# Patient Record
Sex: Female | Born: 1985 | Race: Black or African American | Hispanic: No | Marital: Single | Smoking: Current every day smoker
Health system: Southern US, Community
[De-identification: ages and names within clinical notes are randomized; demographics above are authoritative.]

## PROBLEM LIST (undated history)

## (undated) ENCOUNTER — Inpatient Hospital Stay (HOSPITAL_COMMUNITY): Payer: Self-pay

## (undated) DIAGNOSIS — F410 Panic disorder [episodic paroxysmal anxiety] without agoraphobia: Secondary | ICD-10-CM

## (undated) DIAGNOSIS — R109 Unspecified abdominal pain: Secondary | ICD-10-CM

## (undated) DIAGNOSIS — O26899 Other specified pregnancy related conditions, unspecified trimester: Secondary | ICD-10-CM

## (undated) HISTORY — PX: OTHER SURGICAL HISTORY: SHX169

---
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
## 2007-06-06 ENCOUNTER — Emergency Department (HOSPITAL_COMMUNITY): Admission: EM | Admit: 2007-06-06 | Discharge: 2007-06-06 | Payer: Self-pay | Admitting: Emergency Medicine

## 2008-09-26 ENCOUNTER — Emergency Department (HOSPITAL_COMMUNITY): Admission: EM | Admit: 2008-09-26 | Discharge: 2008-09-27 | Payer: Self-pay | Admitting: Emergency Medicine

## 2008-12-05 ENCOUNTER — Emergency Department (HOSPITAL_COMMUNITY): Admission: EM | Admit: 2008-12-05 | Discharge: 2008-12-05 | Payer: Self-pay | Admitting: Emergency Medicine

## 2009-02-04 ENCOUNTER — Emergency Department (HOSPITAL_COMMUNITY): Admission: EM | Admit: 2009-02-04 | Discharge: 2009-02-04 | Payer: Self-pay | Admitting: Emergency Medicine

## 2010-10-02 LAB — URINALYSIS, ROUTINE W REFLEX MICROSCOPIC
Bilirubin Urine: NEGATIVE
Glucose, UA: NEGATIVE mg/dL
Hgb urine dipstick: NEGATIVE
Ketones, ur: NEGATIVE mg/dL
Nitrite: NEGATIVE
Protein, ur: NEGATIVE mg/dL
Specific Gravity, Urine: 1.025 (ref 1.005–1.030)
Urobilinogen, UA: 0.2 mg/dL (ref 0.0–1.0)
pH: 5.5 (ref 5.0–8.0)

## 2010-10-02 LAB — DIFFERENTIAL
Eosinophils Absolute: 0.1 10*3/uL (ref 0.0–0.7)
Eosinophils Relative: 2 % (ref 0–5)
Lymphocytes Relative: 36 % (ref 12–46)
Lymphs Abs: 2.3 10*3/uL (ref 0.7–4.0)
Monocytes Absolute: 0.3 10*3/uL (ref 0.1–1.0)
Monocytes Relative: 4 % (ref 3–12)

## 2010-10-02 LAB — BASIC METABOLIC PANEL
BUN: 13 mg/dL (ref 6–23)
CO2: 27 mEq/L (ref 19–32)
Calcium: 9.3 mg/dL (ref 8.4–10.5)
Chloride: 103 mEq/L (ref 96–112)
Creatinine, Ser: 0.78 mg/dL (ref 0.4–1.2)
GFR calc Af Amer: >60 mL/min (ref >60)
GFR calc non Af Amer: >60 mL/min (ref >60)
Glucose, Bld: 122 mg/dL — ABNORMAL HIGH (ref 70–99)
Potassium: 3.7 mEq/L (ref 3.5–5.1)
Sodium: 136 mEq/L (ref 135–145)

## 2010-10-02 LAB — POCT PREGNANCY, URINE: Preg Test, Ur: NEGATIVE

## 2010-10-02 LAB — CBC
HCT: 43.1 % (ref 36.0–46.0)
Hemoglobin: 14.8 g/dL (ref 12.0–15.0)
MCHC: 34.3 g/dL (ref 30.0–36.0)
MCV: 98.9 fL (ref 78.0–100.0)
Platelets: 292 10*3/uL (ref 150–400)
RBC: 4.35 MIL/uL (ref 3.87–5.11)
RDW: 12.5 % (ref 11.5–15.5)
WBC: 6.6 10*3/uL (ref 4.0–10.5)

## 2010-10-04 LAB — URINE CULTURE

## 2010-10-04 LAB — WET PREP, GENITAL
Trich, Wet Prep: NONE SEEN
WBC, Wet Prep HPF POC: NONE SEEN
Yeast Wet Prep HPF POC: NONE SEEN

## 2010-10-04 LAB — GC/CHLAMYDIA PROBE AMP, GENITAL: Chlamydia, DNA Probe: NEGATIVE

## 2010-10-04 LAB — URINALYSIS, ROUTINE W REFLEX MICROSCOPIC
Glucose, UA: NEGATIVE mg/dL
Specific Gravity, Urine: 1.033 — ABNORMAL HIGH (ref 1.005–1.030)
Urobilinogen, UA: 0.2 mg/dL (ref 0.0–1.0)
pH: 5.5 (ref 5.0–8.0)

## 2010-10-04 LAB — URINE MICROSCOPIC-ADD ON

## 2010-10-27 ENCOUNTER — Emergency Department (HOSPITAL_COMMUNITY)
Admission: EM | Admit: 2010-10-27 | Discharge: 2010-10-27 | Discharge status: Against Medical Advice | Payer: Self-pay | Attending: Emergency Medicine | Admitting: Emergency Medicine

## 2010-10-27 DIAGNOSIS — N898 Other specified noninflammatory disorders of vagina: Secondary | ICD-10-CM | POA: Insufficient documentation

## 2011-04-04 LAB — GC/CHLAMYDIA PROBE AMP, GENITAL
Chlamydia, DNA Probe: NEGATIVE
GC Probe Amp, Genital: NEGATIVE

## 2011-04-04 LAB — URINALYSIS, ROUTINE W REFLEX MICROSCOPIC
Bilirubin Urine: NEGATIVE
Hgb urine dipstick: NEGATIVE
Specific Gravity, Urine: 1.019
pH: 7

## 2011-04-04 LAB — URINE MICROSCOPIC-ADD ON

## 2011-05-26 ENCOUNTER — Emergency Department (HOSPITAL_COMMUNITY)
Admission: EM | Admit: 2011-05-26 | Discharge: 2011-05-26 | Discharge status: Against Medical Advice | Payer: Self-pay | Attending: Emergency Medicine | Admitting: Emergency Medicine

## 2011-05-26 ENCOUNTER — Encounter: Payer: Self-pay | Admitting: *Deleted

## 2011-05-26 DIAGNOSIS — IMO0002 Reserved for concepts with insufficient information to code with codable children: Secondary | ICD-10-CM | POA: Insufficient documentation

## 2011-05-26 DIAGNOSIS — R109 Unspecified abdominal pain: Secondary | ICD-10-CM | POA: Insufficient documentation

## 2011-05-26 DIAGNOSIS — F411 Generalized anxiety disorder: Secondary | ICD-10-CM | POA: Insufficient documentation
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
## 2011-05-26 NOTE — ED Notes (Signed)
Pt gave false information (name and birthdate) on arrival. After several questions, the patient gave the correct information.
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
## 2011-06-03 ENCOUNTER — Emergency Department (HOSPITAL_COMMUNITY)
Admission: EM | Admit: 2011-06-03 | Discharge: 2011-06-03 | Discharge status: Against Medical Advice | Payer: Self-pay | Attending: Emergency Medicine | Admitting: Emergency Medicine

## 2011-06-03 ENCOUNTER — Encounter (HOSPITAL_COMMUNITY): Payer: Self-pay | Admitting: *Deleted

## 2011-06-03 DIAGNOSIS — M549 Dorsalgia, unspecified: Secondary | ICD-10-CM | POA: Insufficient documentation

## 2011-06-03 DIAGNOSIS — R1110 Vomiting, unspecified: Secondary | ICD-10-CM | POA: Insufficient documentation

## 2011-06-03 DIAGNOSIS — M542 Cervicalgia: Secondary | ICD-10-CM | POA: Insufficient documentation

## 2011-06-03 NOTE — ED Notes (Signed)
Pt called, no response
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
## 2011-06-03 NOTE — ED Notes (Signed)
Patient reports nausea and vomitting.  She also reports neck pain and back pain for the past 3 days. Patient denies pain when voiding.  Patient states she has some abd pain.

## 2011-08-10 ENCOUNTER — Encounter (HOSPITAL_COMMUNITY): Payer: Self-pay

## 2011-08-10 ENCOUNTER — Emergency Department (HOSPITAL_COMMUNITY)
Admission: EM | Admit: 2011-08-10 | Discharge: 2011-08-10 | Disposition: A | Discharge status: Home / Self Care | Payer: Self-pay | Attending: Emergency Medicine | Admitting: Emergency Medicine

## 2011-08-10 ENCOUNTER — Emergency Department (HOSPITAL_COMMUNITY): Payer: Self-pay

## 2011-08-10 DIAGNOSIS — M549 Dorsalgia, unspecified: Secondary | ICD-10-CM | POA: Insufficient documentation

## 2011-08-10 DIAGNOSIS — R109 Unspecified abdominal pain: Secondary | ICD-10-CM | POA: Insufficient documentation

## 2011-08-10 DIAGNOSIS — R0789 Other chest pain: Secondary | ICD-10-CM

## 2011-08-10 DIAGNOSIS — F17200 Nicotine dependence, unspecified, uncomplicated: Secondary | ICD-10-CM | POA: Insufficient documentation

## 2011-08-10 DIAGNOSIS — J45909 Unspecified asthma, uncomplicated: Secondary | ICD-10-CM | POA: Insufficient documentation

## 2011-08-10 DIAGNOSIS — R071 Chest pain on breathing: Secondary | ICD-10-CM | POA: Insufficient documentation

## 2011-08-10 MED ORDER — NAPROXEN 500 MG PO TABS: 500.0000 mg | ORAL_TABLET | Freq: Two times a day (BID) | ORAL | Status: DC

## 2011-08-10 MED ORDER — KETOROLAC TROMETHAMINE 30 MG/ML IJ SOLN
60.0000 mg | Freq: Once | INTRAMUSCULAR | Status: AC
  Administered 2011-08-10: 60 mg via INTRAMUSCULAR
  Filled 2011-08-10 (×2): qty 1

## 2011-08-10 NOTE — ED Notes (Signed)
PATIENT HAS RETURNED FROM XRAY.
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
## 2011-08-10 NOTE — ED Provider Notes (Signed)
Medical screening examination/treatment/procedure(s) were performed by non-physician practitioner and as supervising physician I was immediately available for consultation/collaboration.   Shelda Jakes, MD 08/10/11 [phone]

## 2011-10-18 ENCOUNTER — Encounter (HOSPITAL_COMMUNITY): Payer: Self-pay | Admitting: Emergency Medicine

## 2011-10-18 ENCOUNTER — Emergency Department (HOSPITAL_COMMUNITY)
Admission: EM | Admit: 2011-10-18 | Discharge: 2011-10-19 | Disposition: A | Discharge status: Home / Self Care | Payer: Self-pay | Attending: Emergency Medicine | Admitting: Emergency Medicine

## 2011-10-18 DIAGNOSIS — O99340 Other mental disorders complicating pregnancy, unspecified trimester: Secondary | ICD-10-CM | POA: Insufficient documentation

## 2011-10-18 DIAGNOSIS — F1010 Alcohol abuse, uncomplicated: Secondary | ICD-10-CM | POA: Insufficient documentation
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
## 2011-10-18 NOTE — ED Notes (Signed)
Pt declining to say how far along she is in her pregnancy-Rapid response OB paged and spoke with Billy Fischer on call and will look pt up to see hx-will call back with info

## 2011-10-18 NOTE — ED Notes (Signed)
Pt in hallway stating she wants to leave-denies any treatment-pt is ambulatory with no assist and speaking full clear sentences-Dr. Lars Mage and this writer talked with pt about further treatment-having rapid response OB to check her-pt again declined any further treatment-[employer] to escort pt out
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
## 2011-11-05 ENCOUNTER — Encounter (HOSPITAL_COMMUNITY): Payer: Self-pay | Admitting: *Deleted

## 2011-11-05 ENCOUNTER — Emergency Department (HOSPITAL_COMMUNITY)
Admission: EM | Admit: 2011-11-05 | Discharge: 2011-11-05 | Disposition: A | Discharge status: Home / Self Care | Payer: Self-pay | Attending: Emergency Medicine | Admitting: Emergency Medicine

## 2011-11-05 DIAGNOSIS — R21 Rash and other nonspecific skin eruption: Secondary | ICD-10-CM | POA: Insufficient documentation

## 2011-11-05 DIAGNOSIS — R112 Nausea with vomiting, unspecified: Secondary | ICD-10-CM | POA: Insufficient documentation

## 2011-11-05 DIAGNOSIS — N390 Urinary tract infection, site not specified: Secondary | ICD-10-CM | POA: Insufficient documentation

## 2011-11-05 DIAGNOSIS — T781XXA Other adverse food reactions, not elsewhere classified, initial encounter: Secondary | ICD-10-CM | POA: Insufficient documentation

## 2011-11-05 DIAGNOSIS — R229 Localized swelling, mass and lump, unspecified: Secondary | ICD-10-CM | POA: Insufficient documentation

## 2011-11-05 DIAGNOSIS — X58XXXA Exposure to other specified factors, initial encounter: Secondary | ICD-10-CM | POA: Insufficient documentation

## 2011-11-05 DIAGNOSIS — T7840XA Allergy, unspecified, initial encounter: Secondary | ICD-10-CM

## 2011-11-05 DIAGNOSIS — J45909 Unspecified asthma, uncomplicated: Secondary | ICD-10-CM | POA: Insufficient documentation

## 2011-11-05 LAB — CBC
HCT: 40.3 % (ref 36.0–46.0)
Hemoglobin: 14.1 g/dL (ref 12.0–15.0)
MCH: 34.6 pg — ABNORMAL HIGH (ref 26.0–34.0)
MCHC: 35 g/dL (ref 30.0–36.0)
MCV: 99 fL (ref 78.0–100.0)

## 2011-11-05 LAB — DIFFERENTIAL
Basophils Relative: 1 % (ref 0–1)
Eosinophils Absolute: 0.1 10*3/uL (ref 0.0–0.7)
Eosinophils Relative: 2 % (ref 0–5)
Monocytes Absolute: 0.6 10*3/uL (ref 0.1–1.0)
Monocytes Relative: 9 % (ref 3–12)

## 2011-11-05 LAB — BASIC METABOLIC PANEL
BUN: 10 mg/dL (ref 6–23)
Chloride: 101 mEq/L (ref 96–112)
Creatinine, Ser: 0.83 mg/dL (ref 0.50–1.10)
GFR calc Af Amer: >90 mL/min (ref >90)
GFR calc non Af Amer: >90 mL/min (ref >90)

## 2011-11-05 LAB — URINALYSIS, ROUTINE W REFLEX MICROSCOPIC
Bilirubin Urine: NEGATIVE
Ketones, ur: 15 mg/dL — AB
Nitrite: NEGATIVE
pH: 6 (ref 5.0–8.0)

## 2011-11-05 LAB — URINE MICROSCOPIC-ADD ON

## 2011-11-05 MED ORDER — SULFAMETHOXAZOLE-TRIMETHOPRIM 800-160 MG PO TABS: 1.0000 | ORAL_TABLET | Freq: Two times a day (BID) | ORAL | Status: AC

## 2011-11-05 MED ORDER — DIPHENHYDRAMINE HCL 50 MG/ML IJ SOLN
25.0000 mg | Freq: Once | INTRAMUSCULAR | Status: AC
  Administered 2011-11-05: 25 mg via INTRAVENOUS
  Filled 2011-11-05: qty 1

## 2011-11-05 MED ORDER — SODIUM CHLORIDE 0.9 % IV SOLN
Freq: Once | INTRAVENOUS | Status: AC
  Administered 2011-11-05: 1000 mL/h via INTRAVENOUS
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
## 2011-11-05 NOTE — Discharge Instructions (Signed)
Allergic Reaction, Mild to Moderate Allergies may happen from anything your body is sensitive to. This may be food, medications, pollens, chemicals, and nearly anything around you in everyday life that produces allergens. An allergen is anything that causes an allergy producing substance. Allergens cause your body to release allergic antibodies. Through a chain of events, they cause a release of histamine into the blood stream. Histamines are meant to protect you, but they also cause your discomfort. This is why antihistamines are often used for allergies. Heredity is often a factor in causing allergic reactions. This means you may have some of the same allergies as your parents. Allergies happen in all age groups. You may have some idea of what caused your reaction. There are many allergens around us. It may be difficult to know what caused your reaction. If this is a first time event, it may never happen again. Allergies cannot be cured but can be controlled with medications. SYMPTOMS  You may get some or all of the following problems from allergies.  Swelling and itching in and around the mouth.   Tearing, itchy eyes.   Nasal congestion and runny nose.   Sneezing and coughing.   An itchy red rash or hives.   Vomiting or diarrhea.   Difficulty breathing.  Seasonal allergies occur in all age groups. They are seasonal because they usually occur during the same season every year. They may be a reaction to molds, grass pollens, or tree pollens. Other causes of allergies are house dust mite allergens, pet dander and mold spores. These are just a common few of the thousands of allergens around us. All of the symptoms listed above happen when you come in contact with pollens and other allergens. Seasonal allergies are usually not life threatening. They are generally more of a nuisance that can often be handled using medications. Hay fever is a combination of all or some of the above listed allergy  problems. It may often be treated with simple over-the-counter medications such as diphenhydramine. Take medication as directed. Check with your caregiver or package insert for child dosages. TREATMENT AND HOME CARE INSTRUCTIONS If hives or rash are present:  Take medications as directed.   You may use an over-the-counter antihistamine (diphenhydramine) for hives and itching as needed. Do not drive or drink alcohol until medications used to treat the reaction have worn off. Antihistamines tend to make people sleepy.   Apply cold cloths (compresses) to the skin or take baths in cool water. This will help itching. Avoid hot baths or showers. Heat will make a rash and itching worse.   If your allergies persist and become more severe, and over the counter medications are not effective, there are many new medications your caretaker can prescribe. Immunotherapy or desensitizing injections can be used if all else fails. Follow up with your caregiver if problems continue.  SEEK MEDICAL CARE IF:   Your allergies are becoming progressively more troublesome.   You suspect a food allergy. Symptoms generally happen within 30 minutes of eating a food.   Your symptoms have not gone away within 2 days or are getting worse.   You develop new symptoms.   You want to retest yourself or your child with a food or drink you think causes an allergic reaction. Never test yourself or your child of a suspected allergy without being under the watchful eye of your caregivers. A second exposure to an allergen may be life-threatening.  SEEK IMMEDIATE MEDICAL CARE IF:  You   develop difficulty breathing or wheezing, or have a tight feeling in your chest or throat.   You develop a swollen mouth, hives, swelling, or itching all over your body.  A severe reaction with any of the above problems should be considered life-threatening. If you suddenly develop difficulty breathing call for local emergency medical help. THIS IS AN  EMERGENCY. MAKE SURE YOU:   Understand these instructions.   Will watch your condition.   Will get help right away if you are not doing well or get worse.  Document Released: 04/10/2007 Document Revised: 06/02/2011 Document Reviewed: 04/10/2007 [facility] Patient Information 2012 [place], [place].Urinary Tract Infection A urinary tract infection (UTI) is often caused by a germ (bacteria). A UTI is usually helped with medicine (antibiotics) that kills germs. Take all the medicine until it is gone. Do this even if you are feeling better. You are usually better in 7 to 10 days. HOME CARE   Drink enough water and fluids to keep your pee (urine) clear or pale yellow. Drink:   Cranberry juice.   Water.   Avoid:   Caffeine.   Tea.   Bubbly (carbonated) drinks.   Alcohol.   Only take medicine as told by your doctor.   To prevent further infections:   Pee often.   After pooping (bowel movement), women should wipe from front to back. Use each tissue only once.   Pee before and after having sex (intercourse).  Ask your doctor when your test results will be ready. Make sure you follow up and get your test results.  GET HELP RIGHT AWAY IF:   There is very bad back pain or lower belly (abdominal) pain.   You get the chills.   You have a fever.   Your baby is older than 3 months with a rectal temperature of 102 F (38.9 C) or higher.   Your baby is 77 months old or younger with a rectal temperature of 100.4 F (38 C) or higher.   You feel sick to your stomach (nauseous) or throw up (vomit).   There is continued burning with peeing.   Your problems are not better in 3 days. Return sooner if you are getting worse.  MAKE SURE YOU:   Understand these instructions.   Will watch your condition.   Will get help right away if you are not doing well or get worse.  Document Released: 11/30/2007 Document Revised: 06/02/2011 Document Reviewed: 11/30/2007 [facility] Patient  Information 2012 [place], [place].

## 2012-01-10 ENCOUNTER — Encounter (HOSPITAL_COMMUNITY): Payer: Self-pay | Admitting: *Deleted

## 2012-01-10 ENCOUNTER — Emergency Department (HOSPITAL_COMMUNITY)
Admission: EM | Admit: 2012-01-10 | Discharge: 2012-01-10 | Disposition: A | Discharge status: Home / Self Care | Payer: Self-pay | Attending: Emergency Medicine | Admitting: Emergency Medicine

## 2012-01-10 DIAGNOSIS — R109 Unspecified abdominal pain: Secondary | ICD-10-CM | POA: Insufficient documentation

## 2012-01-10 LAB — POCT I-STAT, CHEM 8
BUN: 5 mg/dL — ABNORMAL LOW (ref 6–23)
Creatinine, Ser: 0.9 mg/dL (ref 0.50–1.10)
Glucose, Bld: 107 mg/dL — ABNORMAL HIGH (ref 70–99)
Hemoglobin: 15 g/dL (ref 12.0–15.0)
TCO2: 24 mmol/L (ref 0–100)

## 2012-01-10 LAB — URINALYSIS, ROUTINE W REFLEX MICROSCOPIC
Glucose, UA: NEGATIVE mg/dL
Nitrite: NEGATIVE
Protein, ur: NEGATIVE mg/dL
Urobilinogen, UA: 0.2 mg/dL (ref 0.0–1.0)

## 2012-01-10 LAB — CBC WITH DIFFERENTIAL/PLATELET
Basophils Absolute: 0 10*3/uL (ref 0.0–0.1)
HCT: 38.5 % (ref 36.0–46.0)
Lymphocytes Relative: 31 % (ref 12–46)
Neutro Abs: 3.5 10*3/uL (ref 1.7–7.7)
Neutrophils Relative %: 60 % (ref 43–77)
Platelets: 260 10*3/uL (ref 150–400)
RDW: 12.3 % (ref 11.5–15.5)
WBC: 5.9 10*3/uL (ref 4.0–10.5)

## 2012-01-10 LAB — POCT PREGNANCY, URINE: Preg Test, Ur: NEGATIVE

## 2012-01-10 LAB — URINE MICROSCOPIC-ADD ON
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
## 2012-01-10 NOTE — ED Notes (Signed)
Called no answer

## 2012-01-24 ENCOUNTER — Emergency Department (HOSPITAL_COMMUNITY)
Admission: EM | Admit: 2012-01-24 | Discharge: 2012-01-24 | Disposition: A | Discharge status: Home / Self Care | Payer: Self-pay | Attending: Emergency Medicine | Admitting: Emergency Medicine

## 2012-01-24 ENCOUNTER — Encounter (HOSPITAL_COMMUNITY): Payer: Self-pay | Admitting: *Deleted

## 2012-01-24 DIAGNOSIS — F1910 Other psychoactive substance abuse, uncomplicated: Secondary | ICD-10-CM

## 2012-01-24 DIAGNOSIS — J45909 Unspecified asthma, uncomplicated: Secondary | ICD-10-CM | POA: Insufficient documentation

## 2012-01-24 DIAGNOSIS — F17200 Nicotine dependence, unspecified, uncomplicated: Secondary | ICD-10-CM | POA: Insufficient documentation

## 2012-01-24 DIAGNOSIS — F1210 Cannabis abuse, uncomplicated: Secondary | ICD-10-CM | POA: Insufficient documentation

## 2012-01-24 DIAGNOSIS — F1010 Alcohol abuse, uncomplicated: Secondary | ICD-10-CM | POA: Insufficient documentation

## 2012-01-24 LAB — POCT I-STAT, CHEM 8
BUN: 10 mg/dL (ref 6–23)
Calcium, Ion: 1.14 mmol/L (ref 1.12–1.23)
Chloride: 107 mEq/L (ref 96–112)
Creatinine, Ser: 1 mg/dL (ref 0.50–1.10)
TCO2: 21 mmol/L (ref 0–100)

## 2012-01-24 MED ORDER — LORAZEPAM 2 MG/ML IJ SOLN
1.0000 mg | Freq: Once | INTRAMUSCULAR | Status: AC
Start: 2012-01-24 — End: 2012-01-24
  Administered 2012-01-24: 1 mg via INTRAVENOUS
  Filled 2012-01-24: qty 1

## 2012-01-24 MED ORDER — LORAZEPAM 1 MG PO TABS: 1.0000 mg | ORAL_TABLET | Freq: Three times a day (TID) | ORAL | Status: DC | PRN

## 2012-01-24 MED ORDER — SODIUM CHLORIDE 0.9 % IV BOLUS (SEPSIS)
1000.0000 mL | Freq: Once | INTRAVENOUS | Status: AC
  Administered 2012-01-24: 1000 mL via INTRAVENOUS

## 2012-01-24 NOTE — ED Notes (Signed)
Pt appeared anxious regarding her medical condition. Asked this RN multiple times and continuously regarding how she is doing with her vital signs, Pt informed her vital sign is normal. Pt also sts she is feeling hot, door opened for pt, and pt sts it helps.
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
## 2012-01-27 ENCOUNTER — Encounter (HOSPITAL_COMMUNITY): Payer: Self-pay | Admitting: *Deleted

## 2012-01-27 ENCOUNTER — Emergency Department (HOSPITAL_COMMUNITY)
Admission: EM | Admit: 2012-01-27 | Discharge: 2012-01-28 | Disposition: A | Discharge status: Home / Self Care | Payer: Self-pay | Attending: Emergency Medicine | Admitting: Emergency Medicine

## 2012-01-27 ENCOUNTER — Encounter (HOSPITAL_COMMUNITY): Payer: Self-pay | Admitting: Emergency Medicine

## 2012-01-27 ENCOUNTER — Emergency Department (HOSPITAL_COMMUNITY)
Admission: EM | Admit: 2012-01-27 | Discharge: 2012-01-27 | Disposition: A | Discharge status: Home / Self Care | Payer: Self-pay | Attending: Emergency Medicine | Admitting: Emergency Medicine

## 2012-01-27 DIAGNOSIS — F1010 Alcohol abuse, uncomplicated: Secondary | ICD-10-CM | POA: Insufficient documentation

## 2012-01-27 DIAGNOSIS — J45909 Unspecified asthma, uncomplicated: Secondary | ICD-10-CM | POA: Insufficient documentation

## 2012-01-27 DIAGNOSIS — F17200 Nicotine dependence, unspecified, uncomplicated: Secondary | ICD-10-CM | POA: Insufficient documentation

## 2012-01-27 DIAGNOSIS — R5381 Other malaise: Secondary | ICD-10-CM | POA: Insufficient documentation

## 2012-01-27 DIAGNOSIS — F1910 Other psychoactive substance abuse, uncomplicated: Secondary | ICD-10-CM

## 2012-01-27 DIAGNOSIS — R079 Chest pain, unspecified: Secondary | ICD-10-CM | POA: Insufficient documentation

## 2012-01-27 DIAGNOSIS — F1410 Cocaine abuse, uncomplicated: Secondary | ICD-10-CM | POA: Insufficient documentation

## 2012-01-27 DIAGNOSIS — F1210 Cannabis abuse, uncomplicated: Secondary | ICD-10-CM | POA: Insufficient documentation

## 2012-01-27 LAB — GLUCOSE, CAPILLARY

## 2012-01-27 MED ORDER — LORAZEPAM 2 MG/ML IJ SOLN
0.5000 mg | Freq: Once | INTRAMUSCULAR | Status: AC
  Administered 2012-01-28: 0.5 mg via INTRAVENOUS
  Filled 2012-01-27: qty 1

## 2012-01-27 NOTE — ED Notes (Signed)
Pt states she wants an ekg now. Appears anxious.  Pt states she has not had etoh since last night.  HR 94.
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
## 2012-01-27 NOTE — ED Notes (Signed)
MD at bedside.  EDP J present at bs to evaluate this pt.  Pt reports seen and treated here recently.  Pt admits to drug and ETOh daily. Declines help at this time. Educated on the uses of polysubstance abuse.  Pt ambulating in room without difficulty

## 2012-01-27 NOTE — ED Notes (Signed)
Pt to ED via [facility] for chest pain.  Pt very anxious.  EMS reports that pt was seen at [facility] today for same and also seen on July 30th.  Pt has Rx for Ativan that she did not get filled due to lack of funds.

## 2012-01-27 NOTE — ED Provider Notes (Signed)
History     CSN: [id number]  Arrival date & time 01/27/12  1932   First MD Initiated Contact with Patient 01/27/12 2326      Chief Complaint  Patient presents with  . Chest Pain  . Anxiety    (Consider location/radiation/quality/duration/timing/severity/associated sxs/prior treatment) HPI Comments: 26 year old female with a history of daily alcohol use which is heavy, liquor and beer as well as recent use of cocaine and crack. She states that this was approximately 3 days ago but since that time has had ongoing mild weakness and near syncope when she stands. She denies dysuria, cough, chills, fever, chest pain, back pain, abdominal pain, nausea or vomiting. She was seen again earlier today complaining of weakness, given resource guide for outpatient management of her symptoms and she presents again to [place] stating that she wants help inpatient for her alcohol abuse. She has not filled her Ativan prescription prior to this visit. She states that her last use of alcohol was 24 hours ago and she is starting to feel shaky  Patient is a 26 y.o. female presenting with chest pain and anxiety. The history is provided by the patient and medical records.  Chest Pain    Anxiety Associated symptoms include chest pain.    Past Medical History  Diagnosis Date  . Asthma     History reviewed. No pertinent past surgical history.  No family history on file.  History  Substance Use Topics  . Smoking status: Current Everyday Smoker -- 0.5 packs/day  . Smokeless tobacco: Not on file  . Alcohol Use: Yes     occaisional     OB History    Grav Para Term Preterm Abortions TAB SAB Ect Mult Living   3 2              Review of Systems  Cardiovascular: Positive for chest pain.  All other systems reviewed and are negative.    Allergies  Review of patient's allergies indicates no known allergies.  Home Medications  No current outpatient prescriptions on file.  BP 120/79  Pulse 93  Temp  98.8 F (37.1 C) (Oral)  Resp 18  SpO2 97%  LMP 12/27/2011  Physical Exam  Nursing note and vitals reviewed. Constitutional: She appears well-developed and well-nourished. No distress.  HENT:  Head: Normocephalic and atraumatic.  Mouth/Throat: Oropharynx is clear and moist. No oropharyngeal exudate.  Eyes: Conjunctivae and EOM are normal. Pupils are equal, round, and reactive to light. Right eye exhibits no discharge. Left eye exhibits no discharge. No scleral icterus.  Neck: Normal range of motion. Neck supple. No JVD present. No thyromegaly present.  Cardiovascular: Normal rate, regular rhythm, normal heart sounds and intact distal pulses.  Exam reveals no gallop and no friction rub.   No murmur heard. Pulmonary/Chest: Effort normal and breath sounds normal. No respiratory distress. She has no wheezes. She has no rales.  Abdominal: Soft. Bowel sounds are normal. She exhibits no distension and no mass. There is no tenderness.  Musculoskeletal: Normal range of motion. She exhibits no edema and no tenderness.  Lymphadenopathy:    She has no cervical adenopathy.  Neurological: She is alert. Coordination normal.       Clear speech, normal mentation, alert and oriented to person place and time, no pronator drift, no tremor, does  appear anxious, normal finger-nose-finger, no limb ataxia, normal heel shin bilaterally.  Skin: Skin is warm and dry. No rash noted. No erythema.  Psychiatric: She has a normal mood and  affect. Her behavior is normal.       Anxious appearing, no depression or suicidal thoughts, no hallucinations    ED Course  Procedures (including critical care time)  Labs Reviewed  CBC - Abnormal; Notable for the following:    MCH 34.1 (*)     All other components within normal limits  COMPREHENSIVE METABOLIC PANEL - Abnormal; Notable for the following:    Sodium 134 (*)     All other components within normal limits  URINE RAPID DRUG SCREEN (HOSP PERFORMED) - Abnormal;  Notable for the following:    Cocaine POSITIVE (*)     Tetrahydrocannabinol POSITIVE (*)     All other components within normal limits  URINALYSIS, ROUTINE W REFLEX MICROSCOPIC - Abnormal; Notable for the following:    APPearance CLOUDY (*)     Hgb urine dipstick LARGE (*)     Ketones, ur 40 (*)     Leukocytes, UA TRACE (*)     All other components within normal limits  URINE MICROSCOPIC-ADD ON - Abnormal; Notable for the following:    Squamous Epithelial / LPF MANY (*)     All other components within normal limits  ETHANOL  PREGNANCY, URINE   No results found.   1. Alcohol abuse       MDM  At this time the vital signs are normal, her exam is normal though she does appear to have significant anxiety. There is no other abnormalities on her exam, she wishes placement for alcohol abuse, Ativan given for mild withdrawal, consult behavioral health assessment team for placement.  ACT team consulted for placement.  Pt told tht she was unable to be placed at [facility] b/c she had court dates pending.  She has Rx for Ativan to be used as outpatient.  She is amenable to f/u as outpatient.  I have cautioned her agaist ongoing use of drugs and ETOH.      Vida Roller, MD 01/28/12 1535

## 2012-01-27 NOTE — ED Provider Notes (Signed)
History     CSN: [id number]  Arrival date & time 01/27/12  1515   First MD Initiated Contact with Patient 01/27/12 1525      Chief Complaint  Patient presents with  . Weakness    " hang over"    (Consider location/radiation/quality/duration/timing/severity/associated sxs/prior treatment) HPI He complains of generalized weakness onset this afternoon. Patient is still drinking alcohol and smoking marijuana last night. She's drinks alcohol and smokes marijuana on a daily basis. The patient seen here 01/24/2012 with same complaint given resource guide and prescription for Ativan which she has not filled. Diagnosed with polysubstance abuse She reports that she is able to fill her prescription today. Symptoms worse with walking and not improved by anything. She denies pain denies focal weakness or numbness or inability to speak no injury no other complaint Past Medical History  Diagnosis Date  . Asthma     History reviewed. No pertinent past surgical history.  No family history on file.  History  Substance Use Topics  . Smoking status: Current Everyday Smoker -- 0.5 packs/day  . Smokeless tobacco: Not on file  . Alcohol Use: Yes     occaisional    Positive marijuana use OB History    Grav Para Term Preterm Abortions TAB SAB Ect Mult Living   3 2              Review of Systems  HENT: Negative.   Respiratory: Negative.   Cardiovascular: Negative.   Gastrointestinal: Negative.   Musculoskeletal: Negative.   Skin: Negative.   Neurological: Positive for weakness.  Hematological: Negative.   Psychiatric/Behavioral: Negative.   All other systems reviewed and are negative.    Allergies  Review of patient's allergies indicates no known allergies.  Home Medications  No current outpatient prescriptions on file.  BP 104/91  Pulse 92  Temp 97.7 F (36.5 C) (Oral)  Resp 18  SpO2 100%  LMP 12/27/2011  Physical Exam  Nursing note and vitals reviewed. Constitutional:  She appears well-developed and well-nourished.  HENT:  Head: Normocephalic and atraumatic.  Eyes: Conjunctivae are normal. Pupils are equal, round, and reactive to light.  Neck: Neck supple. No tracheal deviation present. No thyromegaly present.  Cardiovascular: Normal rate and regular rhythm.   No murmur heard. Pulmonary/Chest: Effort normal and breath sounds normal.  Abdominal: Soft. Bowel sounds are normal. She exhibits no distension. There is no tenderness.  Musculoskeletal: Normal range of motion. She exhibits no edema and no tenderness.  Neurological: She is alert. Coordination normal.  Skin: Skin is warm and dry. No rash noted.  Psychiatric: She has a normal mood and affect.    ED Course  Procedures (including critical care time)  Labs Reviewed - No data to display No results found.   No diagnosis found.   CBG 81, normal MDM  I feel the patient's condition is likely due to substance abuse. Plan referral resource guide. No further prescriptions written Diagnoses #1 weakness #2 polysubstance abuse        Doug Sou, MD 01/27/12 1614

## 2012-01-27 NOTE — ED Notes (Signed)
Per ems: pt states she just does not feel right. Pt is stable.pt states she feels like her body is going to shut down

## 2012-01-28 LAB — COMPREHENSIVE METABOLIC PANEL
ALT: 13 U/L (ref 0–35)
Alkaline Phosphatase: 56 U/L (ref 39–117)
BUN: 10 mg/dL (ref 6–23)
CO2: 23 mEq/L (ref 19–32)
GFR calc Af Amer: >90 mL/min (ref >90)
GFR calc non Af Amer: >90 mL/min (ref >90)
Glucose, Bld: 87 mg/dL (ref 70–99)
Potassium: 4 mEq/L (ref 3.5–5.1)
Sodium: 134 mEq/L — ABNORMAL LOW (ref 135–145)
Total Bilirubin: 0.5 mg/dL (ref 0.3–1.2)
Total Protein: 8.2 g/dL (ref 6.0–8.3)

## 2012-01-28 LAB — CBC
HCT: 38.2 % (ref 36.0–46.0)
Hemoglobin: 13.3 g/dL (ref 12.0–15.0)
MCH: 34.1 pg — ABNORMAL HIGH (ref 26.0–34.0)
MCHC: 34.8 g/dL (ref 30.0–36.0)
RBC: 3.9 MIL/uL (ref 3.87–5.11)

## 2012-01-28 LAB — URINALYSIS, ROUTINE W REFLEX MICROSCOPIC
Glucose, UA: NEGATIVE mg/dL
Ketones, ur: 40 mg/dL — AB
Nitrite: NEGATIVE
Specific Gravity, Urine: 1.018 (ref 1.005–1.030)
pH: 6.5 (ref 5.0–8.0)

## 2012-01-28 LAB — URINE MICROSCOPIC-ADD ON

## 2012-01-28 LAB — ETHANOL: Alcohol, Ethyl (B): <11 mg/dL (ref 0–11)

## 2012-01-28 LAB — RAPID URINE DRUG SCREEN, HOSP PERFORMED
Barbiturates: NOT DETECTED
Benzodiazepines: NOT DETECTED

## 2012-01-28 LAB — PREGNANCY, URINE: Preg Test, Ur: NEGATIVE

## 2012-01-28 NOTE — BH Assessment (Addendum)
[facility] Assessment Progress Note      PT was declined at [facility] due to upcoming court date on August 7th.  NO beds at [facility]

## 2012-01-28 NOTE — ED Notes (Signed)
ACT team at bedside to update patient on plan of care at this time - patient is to be discharged

## 2012-01-28 NOTE — ED Notes (Signed)
Pt given discharge and follow up instructions after speaking with provider. Denies further needs at this time. Ambulates to lobby in NAD  
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
## 2012-01-28 NOTE — BH Assessment (Signed)
[facility] Assessment Progress Note      Discussed disposition with Dr Hyacinth Meeker who is in agreement that due to patient's legal troubles (upcoming court date), we are unable to provide her with inpatient detox at this time.  The patient was given outpatient referrals and may pursue inpatient treatment when her legal problems have been resolved.

## 2012-01-28 NOTE — ED Notes (Signed)
ACT team at bedside.  

## 2012-02-07 ENCOUNTER — Emergency Department (HOSPITAL_COMMUNITY): Admission: EM | Admit: 2012-02-07 | Discharge: 2012-02-07 | Discharge status: Against Medical Advice | Payer: Self-pay

## 2012-02-07 NOTE — ED Notes (Signed)
Per EMS: pt coming from [facility] parking lot with c/o of drug abuse, numbness in her hands, from the cocaine, marijuana, and alcohol in the past two days. EMS reports pt being uncooperative, anxious, demanding. Pt was seen here august 2 for detox. Pt states she is worried about her heart.

## 2012-02-07 NOTE — ED Notes (Signed)
Window tech called to be triage. Pt refusing to be triaged. Pt left without being triaged

## 2012-02-13 ENCOUNTER — Encounter (HOSPITAL_COMMUNITY): Payer: Self-pay | Admitting: *Deleted

## 2012-02-13 DIAGNOSIS — F17200 Nicotine dependence, unspecified, uncomplicated: Secondary | ICD-10-CM | POA: Insufficient documentation

## 2012-02-13 DIAGNOSIS — R51 Headache: Secondary | ICD-10-CM | POA: Insufficient documentation

## 2012-02-13 LAB — URINALYSIS, ROUTINE W REFLEX MICROSCOPIC
Glucose, UA: NEGATIVE mg/dL
Ketones, ur: NEGATIVE mg/dL
Nitrite: NEGATIVE
Protein, ur: NEGATIVE mg/dL
Urobilinogen, UA: 0.2 mg/dL (ref 0.0–1.0)

## 2012-02-13 LAB — CBC WITH DIFFERENTIAL/PLATELET
Basophils Absolute: 0.1 10*3/uL (ref 0.0–0.1)
Basophils Relative: 1 % (ref 0–1)
Lymphocytes Relative: 33 % (ref 12–46)
MCHC: 34 g/dL (ref 30.0–36.0)
Neutro Abs: 4.5 10*3/uL (ref 1.7–7.7)
Neutrophils Relative %: 59 % (ref 43–77)
Platelets: 323 10*3/uL (ref 150–400)
RDW: 11.6 % (ref 11.5–15.5)
WBC: 7.7 10*3/uL (ref 4.0–10.5)

## 2012-02-13 LAB — POCT I-STAT, CHEM 8
Calcium, Ion: 1.3 mmol/L — ABNORMAL HIGH (ref 1.12–1.23)
Chloride: 102 mEq/L (ref 96–112)
Glucose, Bld: 104 mg/dL — ABNORMAL HIGH (ref 70–99)
HCT: 42 % (ref 36.0–46.0)
TCO2: 27 mmol/L (ref 0–100)

## 2012-02-13 LAB — POCT PREGNANCY, URINE: Preg Test, Ur: NEGATIVE

## 2012-02-13 LAB — URINE MICROSCOPIC-ADD ON

## 2012-02-13 NOTE — ED Notes (Signed)
Pt is here with headache  To left frontal forehead that started three hours ago and comes in vomiting.  No history of headaches.  Pt reports hurting to mid upper abdomen.  PT alert and talking and feels she may need CT of head and stomach.  No sensitivity to light or neck pain.  Pupils 3 round reactive and brisk

## 2012-02-14 ENCOUNTER — Emergency Department (HOSPITAL_COMMUNITY)
Admission: EM | Admit: 2012-02-14 | Discharge: 2012-02-14 | Disposition: A | Discharge status: Home / Self Care | Payer: Self-pay | Attending: Emergency Medicine | Admitting: Emergency Medicine

## 2012-02-14 DIAGNOSIS — R1013 Epigastric pain: Secondary | ICD-10-CM

## 2012-02-14 MED ORDER — FAMOTIDINE 20 MG PO TABS
20.0000 mg | ORAL_TABLET | Freq: Once | ORAL | Status: AC
  Administered 2012-02-14: 20 mg via ORAL
  Filled 2012-02-14: qty 1

## 2012-02-14 MED ORDER — DIPHENHYDRAMINE HCL 50 MG/ML IJ SOLN: 25.0000 mg | Freq: Once | INTRAMUSCULAR | Status: DC

## 2012-02-14 MED ORDER — FAMOTIDINE 20 MG PO TABS: 20.0000 mg | ORAL_TABLET | Freq: Two times a day (BID) | ORAL | Status: DC

## 2012-02-14 MED ORDER — PANTOPRAZOLE SODIUM 40 MG PO TBEC
40.0000 mg | DELAYED_RELEASE_TABLET | Freq: Once | ORAL | Status: AC
  Administered 2012-02-14: 40 mg via ORAL
  Filled 2012-02-14: qty 1

## 2012-02-14 MED ORDER — METOCLOPRAMIDE HCL 5 MG/ML IJ SOLN: 10.0000 mg | Freq: Once | INTRAMUSCULAR | Status: DC

## 2012-02-14 MED ORDER — GI COCKTAIL ~~LOC~~
30.0000 mL | Freq: Once | ORAL | Status: AC
  Administered 2012-02-14: 30 mL via ORAL
  Filled 2012-02-14: qty 30

## 2012-02-14 MED ORDER — SODIUM CHLORIDE 0.9 % IV BOLUS (SEPSIS): 1000.0000 mL | Freq: Once | INTRAVENOUS | Status: DC

## 2012-02-14 MED ORDER — DEXAMETHASONE SODIUM PHOSPHATE 4 MG/ML IJ SOLN: 10.0000 mg | Freq: Once | INTRAMUSCULAR | Status: DC

## 2012-02-14 MED ORDER — PANTOPRAZOLE SODIUM 20 MG PO TBEC: 20.0000 mg | DELAYED_RELEASE_TABLET | Freq: Every day | ORAL | Status: DC

## 2012-02-14 NOTE — ED Notes (Signed)
IV Team at bedside 

## 2012-02-14 NOTE — ED Notes (Signed)
IV Team paged for iv access

## 2012-02-14 NOTE — Discharge Instructions (Signed)
Abdominal Pain Abdominal pain can be caused by many things. Your caregiver decides the seriousness of your pain by an examination and possibly blood tests and X-rays. Many cases can be observed and treated at home. Most abdominal pain is not caused by a disease and will probably improve without treatment. However, in many cases, more time must pass before a clear cause of the pain can be found. Before that point, it may not be known if you need more testing, or if hospitalization or surgery is needed. HOME CARE INSTRUCTIONS   Do not take laxatives unless directed by your caregiver.   Take pain medicine only as directed by your caregiver.   Only take over-the-counter or prescription medicines for pain, discomfort, or fever as directed by your caregiver.   Try a clear liquid diet (broth, tea, or water) for as long as directed by your caregiver. Slowly move to a bland diet as tolerated.  SEEK IMMEDIATE MEDICAL CARE IF:   The pain does not go away.   You have a fever.   You keep throwing up (vomiting).   The pain is felt only in portions of the abdomen. Pain in the right side could possibly be appendicitis. In an adult, pain in the left lower portion of the abdomen could be colitis or diverticulitis.   You pass bloody or black tarry stools.  MAKE SURE YOU:   Understand these instructions.   Will watch your condition.   Will get help right away if you are not doing well or get worse.  Document Released: 03/23/2005 Document Revised: 06/02/2011 Document Reviewed: 01/30/2008 Exit[employer] Patient Information 2012 [employer].Abdominal Pain Abdominal pain can be caused by many things. Your caregiver decides the seriousness of your pain by an examination and possibly blood tests and X-rays. Many cases can be observed and treated at home. Most abdominal pain is not caused by a disease and will probably improve without treatment. However, in many cases, more time must pass before a clear cause of  the pain can be found. Before that point, it may not be known if you need more testing, or if hospitalization or surgery is needed. HOME CARE INSTRUCTIONS   Do not take laxatives unless directed by your caregiver.   Take pain medicine only as directed by your caregiver.   Only take over-the-counter or prescription medicines for pain, discomfort, or fever as directed by your caregiver.   Try a clear liquid diet (broth, tea, or water) for as long as directed by your caregiver. Slowly move to a bland diet as tolerated.  SEEK IMMEDIATE MEDICAL CARE IF:   The pain does not go away.   You have a fever.   You keep throwing up (vomiting).   The pain is felt only in portions of the abdomen. Pain in the right side could possibly be appendicitis. In an adult, pain in the left lower portion of the abdomen could be colitis or diverticulitis.   You pass bloody or black tarry stools.  MAKE SURE YOU:   Understand these instructions.   Will watch your condition.   Will get help right away if you are not doing well or get worse.  Document Released: 03/23/2005 Document Revised: 06/02/2011 Document Reviewed: 01/30/2008 Exit[employer] Patient Information 2012 [employer]. 

## 2012-02-14 NOTE — ED Notes (Signed)
Attempted to established iv, unsuccessful x 2. Plan of care is updated with pt with verbal understanding. Pt is awaiting another RN for iv established before medications given.

## 2012-02-14 NOTE — ED Provider Notes (Signed)
History     CSN: [id number]  Arrival date & time 02/13/12  4098   First MD Initiated Contact with Patient 02/14/12 0024      Chief Complaint  Patient presents with  . Headache    (Consider location/radiation/quality/duration/timing/severity/associated sxs/prior treatment) HPI  Hx per PT. Epigastric pain, burning, eats a lot of spicy foods, h/o alcohol abuse, is a smoker, having pain unrelieved by anything today, worse with anything she eats. Also some HA. No weakness or numbness. No F/C. Some nausea with emesis tonight - no coffee ground or bloody emesis. Mod in severity. No neck pain or stiffness. No lower ABd pain. No h/o GB problems. No diarrhea or blood in stools. HA improved and PT wants to be evlauted for ABd pain Past Medical History  Diagnosis Date  . Asthma     History reviewed. No pertinent past surgical history.  No family history on file.  History  Substance Use Topics  . Smoking status: Current Everyday Smoker -- 0.5 packs/day  . Smokeless tobacco: Not on file  . Alcohol Use: Yes     occaisional     OB History    Grav Para Term Preterm Abortions TAB SAB Ect Mult Living   3 2              Review of Systems  Constitutional: Negative for fever and chills.  HENT: Negative for neck pain and neck stiffness.   Eyes: Negative for pain.  Respiratory: Negative for shortness of breath.   Cardiovascular: Negative for chest pain.  Gastrointestinal: Negative for blood in stool and anal bleeding.  Genitourinary: Negative for dysuria.  Musculoskeletal: Negative for back pain.  Skin: Negative for rash.  Neurological: Positive for headaches. Negative for seizures, syncope, weakness, light-headedness and numbness.  All other systems reviewed and are negative.    Allergies  Review of patient's allergies indicates no known allergies.  Home Medications  No current outpatient prescriptions on file.  BP 127/71  Pulse 67  Temp 98.7 F (37.1 C) (Oral)  Resp 18   SpO2 100%  LMP 01/13/2012  Physical Exam  Constitutional: She is oriented to person, place, and time. She appears well-developed and well-nourished.  HENT:  Head: Normocephalic and atraumatic.  Eyes: Conjunctivae and EOM are normal. Pupils are equal, round, and reactive to light.  Neck: Trachea normal. Neck supple. No thyromegaly present.  Cardiovascular: Normal rate, regular rhythm, S1 normal, S2 normal and normal pulses.     No systolic murmur is present   No diastolic murmur is present  Pulses:      Radial pulses are 2+ on the right side, and 2+ on the left side.  Pulmonary/Chest: Effort normal and breath sounds normal. She has no wheezes. She has no rhonchi. She has no rales. She exhibits no tenderness.  Abdominal: Soft. Normal appearance and bowel sounds are normal. There is no tenderness. There is no CVA tenderness and negative Murphy's sign.       Mild epigastric discomfort, no RUQ or lower ABD tenderness  Musculoskeletal:       BLE:s Calves nontender, no cords or erythema, negative Homans sign  Neurological: She is alert and oriented to person, place, and time. She has normal strength. No cranial nerve deficit or sensory deficit. GCS eye subscore is 4. GCS verbal subscore is 5. GCS motor subscore is 6.  Skin: Skin is warm and dry. No rash noted. She is not diaphoretic.  Psychiatric: Her speech is normal.  Cooperative and appropriate    ED Course  Procedures (including critical care time)  Results for orders placed during the hospital encounter of 02/14/12  URINALYSIS, ROUTINE W REFLEX MICROSCOPIC      Component Value Range   Color, Urine YELLOW  YELLOW   APPearance CLEAR  CLEAR   Specific Gravity, Urine 1.021  1.005 - 1.030   pH 6.0  5.0 - 8.0   Glucose, UA NEGATIVE  NEGATIVE mg/dL   Hgb urine dipstick NEGATIVE  NEGATIVE   Bilirubin Urine NEGATIVE  NEGATIVE   Ketones, ur NEGATIVE  NEGATIVE mg/dL   Protein, ur NEGATIVE  NEGATIVE mg/dL   Urobilinogen, UA 0.2  0.0 -  1.0 mg/dL   Nitrite NEGATIVE  NEGATIVE   Leukocytes, UA TRACE (*) NEGATIVE  CBC WITH DIFFERENTIAL      Component Value Range   WBC 7.7  4.0 - 10.5 K/uL   RBC 3.92  3.87 - 5.11 MIL/uL   Hemoglobin 13.2  12.0 - 15.0 g/dL   HCT 16.1  09.6 - 04.5 %   MCV 99.0  78.0 - 100.0 fL   MCH 33.7  26.0 - 34.0 pg   MCHC 34.0  30.0 - 36.0 g/dL   RDW 40.9  81.1 - 91.4 %   Platelets 323  150 - 400 K/uL   Neutrophils Relative 59  43 - 77 %   Neutro Abs 4.5  1.7 - 7.7 K/uL   Lymphocytes Relative 33  12 - 46 %   Lymphs Abs 2.6  0.7 - 4.0 K/uL   Monocytes Relative 5  3 - 12 %   Monocytes Absolute 0.4  0.1 - 1.0 K/uL   Eosinophils Relative 2  0 - 5 %   Eosinophils Absolute 0.1  0.0 - 0.7 K/uL   Basophils Relative 1  0 - 1 %   Basophils Absolute 0.1  0.0 - 0.1 K/uL  POCT PREGNANCY, URINE      Component Value Range   Preg Test, Ur NEGATIVE  NEGATIVE  URINE MICROSCOPIC-ADD ON      Component Value Range   Squamous Epithelial / LPF FEW (*) RARE   WBC, UA 3-6  <3 WBC/hpf   Bacteria, UA RARE  RARE  POCT I-STAT, CHEM 8      Component Value Range   Sodium 141  135 - 145 mEq/L   Potassium 4.0  3.5 - 5.1 mEq/L   Chloride 102  96 - 112 mEq/L   BUN 17  6 - 23 mg/dL   Creatinine, Ser 7.82  0.50 - 1.10 mg/dL   Glucose, Bld 956 (*) 70 - 99 mg/dL   Calcium, Ion 2.13 (*) 1.12 - 1.23 mmol/L   TCO2 27  0 - 100 mmol/L   Hemoglobin 14.3  12.0 - 15.0 g/dL   HCT 08.6  57.8 - 46.9 %   GI cocktail, pepcid and protonix provided.   Recheck pain resolved, no acute ABD serial exams. No indication for CT or [place] at this time. Plan d/c home and outpatient f/u  Strict return precauitons verbalized as understood.   MDM   Nursing notes, VS and old records reviewed. Improved with medications.         Sunnie Nielsen, MD 02/14/12 [phone]

## 2012-02-15 ENCOUNTER — Encounter (HOSPITAL_COMMUNITY): Payer: Self-pay | Admitting: *Deleted

## 2012-02-15 DIAGNOSIS — R1110 Vomiting, unspecified: Secondary | ICD-10-CM | POA: Insufficient documentation

## 2012-02-15 DIAGNOSIS — R51 Headache: Secondary | ICD-10-CM | POA: Insufficient documentation

## 2012-02-15 LAB — URINALYSIS, ROUTINE W REFLEX MICROSCOPIC
Bilirubin Urine: NEGATIVE
Glucose, UA: NEGATIVE mg/dL
Ketones, ur: NEGATIVE mg/dL
Nitrite: NEGATIVE
Protein, ur: NEGATIVE mg/dL
pH: 7 (ref 5.0–8.0)

## 2012-02-15 LAB — POCT I-STAT, CHEM 8
Calcium, Ion: 1.25 mmol/L — ABNORMAL HIGH (ref 1.12–1.23)
Chloride: 99 mEq/L (ref 96–112)
Creatinine, Ser: 1 mg/dL (ref 0.50–1.10)
Glucose, Bld: 91 mg/dL (ref 70–99)
HCT: 40 % (ref 36.0–46.0)
Potassium: 3.6 mEq/L (ref 3.5–5.1)

## 2012-02-15 LAB — URINE MICROSCOPIC-ADD ON

## 2012-02-15 NOTE — ED Notes (Signed)
Pt called to Nurse First for vitals with no answer 

## 2012-02-15 NOTE — ED Notes (Signed)
Pt ate Pig's feet one hour ago and then 2O minutes started vomiting and developed headache.  Mother saw that there was old dates on the pigs feet on the shelf.

## 2012-02-16 ENCOUNTER — Emergency Department (HOSPITAL_COMMUNITY)
Admission: EM | Admit: 2012-02-16 | Discharge: 2012-02-16 | Disposition: A | Discharge status: Home / Self Care | Payer: Self-pay | Attending: Emergency Medicine | Admitting: Emergency Medicine

## 2012-02-16 NOTE — ED Notes (Signed)
Patient called for exam room x 3 without an answer.

## 2012-02-16 NOTE — ED Notes (Signed)
Called patient for room, no answer x3 

## 2012-03-07 ENCOUNTER — Encounter (HOSPITAL_COMMUNITY): Payer: Self-pay | Admitting: Emergency Medicine

## 2012-03-07 ENCOUNTER — Emergency Department (HOSPITAL_COMMUNITY)
Admission: EM | Admit: 2012-03-07 | Discharge: 2012-03-07 | Disposition: A | Discharge status: Home / Self Care | Payer: Self-pay | Attending: Emergency Medicine | Admitting: Emergency Medicine

## 2012-03-07 DIAGNOSIS — J45909 Unspecified asthma, uncomplicated: Secondary | ICD-10-CM | POA: Insufficient documentation

## 2012-03-07 DIAGNOSIS — F17200 Nicotine dependence, unspecified, uncomplicated: Secondary | ICD-10-CM | POA: Insufficient documentation

## 2012-03-07 DIAGNOSIS — R51 Headache: Secondary | ICD-10-CM | POA: Insufficient documentation

## 2012-03-07 DIAGNOSIS — F1910 Other psychoactive substance abuse, uncomplicated: Secondary | ICD-10-CM

## 2012-03-07 DIAGNOSIS — F1010 Alcohol abuse, uncomplicated: Secondary | ICD-10-CM | POA: Insufficient documentation

## 2012-03-07 LAB — RAPID URINE DRUG SCREEN, HOSP PERFORMED
Amphetamines: NOT DETECTED
Benzodiazepines: NOT DETECTED
Cocaine: POSITIVE — AB
Opiates: NOT DETECTED

## 2012-03-07 LAB — CBC WITH DIFFERENTIAL/PLATELET
Basophils Absolute: 0.1 10*3/uL (ref 0.0–0.1)
Basophils Relative: 1 % (ref 0–1)
HCT: 41 % (ref 36.0–46.0)
Lymphocytes Relative: 39 % (ref 12–46)
MCHC: 35.4 g/dL (ref 30.0–36.0)
Monocytes Absolute: 0.3 10*3/uL (ref 0.1–1.0)
Neutro Abs: 3.5 10*3/uL (ref 1.7–7.7)
Neutrophils Relative %: 55 % (ref 43–77)
RDW: 11.7 % (ref 11.5–15.5)
WBC: 6.4 10*3/uL (ref 4.0–10.5)

## 2012-03-07 LAB — URINALYSIS, ROUTINE W REFLEX MICROSCOPIC
Glucose, UA: NEGATIVE mg/dL
Leukocytes, UA: NEGATIVE
Nitrite: NEGATIVE
Specific Gravity, Urine: 1.002 — ABNORMAL LOW (ref 1.005–1.030)
pH: 6 (ref 5.0–8.0)

## 2012-03-07 LAB — POCT PREGNANCY, URINE: Preg Test, Ur: NEGATIVE

## 2012-03-07 MED ORDER — SODIUM CHLORIDE 0.9 % IV BOLUS (SEPSIS)
1000.0000 mL | Freq: Once | INTRAVENOUS | Status: AC
  Administered 2012-03-07: 1000 mL via INTRAVENOUS

## 2012-03-07 MED ORDER — KETOROLAC TROMETHAMINE 30 MG/ML IJ SOLN
30.0000 mg | Freq: Once | INTRAMUSCULAR | Status: AC
  Administered 2012-03-07: 30 mg via INTRAVENOUS
  Filled 2012-03-07: qty 1

## 2012-03-07 NOTE — ED Notes (Signed)
EDP and myself spoke with pt regarding poc. Pt verbalized understanding of why she is feeling bad and verbalized understanding on how to prevent. Pt states she feels better after eating.
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
## 2012-03-07 NOTE — ED Notes (Signed)
EDP at bedside  

## 2012-03-07 NOTE — ED Provider Notes (Signed)
History     CSN: [id number]  Arrival date & time 03/07/12  6578   First MD Initiated Contact with Patient 03/07/12 1033      Chief Complaint  Patient presents with  . Near Syncope    (Consider location/radiation/quality/duration/timing/severity/associated sxs/prior treatment) The history is provided by the patient.   26 year old, female, complains of a headache, lightheadedness, and paresthesia in her right hand.  She states that she was out last night with her friends drinking alcohol and she passed out.  She denies nausea, or vomiting.  She denies weakness.  She denies vision changes.  Besides her headache.  She has no pain anywhere else.  Past Medical History  Diagnosis Date  . Asthma     History reviewed. No pertinent past surgical history.  No family history on file.  History  Substance Use Topics  . Smoking status: Current Every Day Smoker -- 0.5 packs/day  . Smokeless tobacco: Not on file  . Alcohol Use: Yes     occaisional     OB History    Grav Para Term Preterm Abortions TAB SAB Ect Mult Living   3 2              Review of Systems  HENT: Negative for neck pain.   Eyes: Negative for visual disturbance.  Cardiovascular: Negative for chest pain.  Gastrointestinal: Negative for nausea, vomiting and abdominal pain.  Musculoskeletal: Negative for back pain.  Skin: Negative for wound.  Neurological: Positive for numbness and headaches. Negative for weakness.       Numbness in right hand, but no numbness anywhere else  Hematological: Does not bruise/bleed easily.  Psychiatric/Behavioral: Negative for confusion.  All other systems reviewed and are negative.    Allergies  Review of patient's allergies indicates no known allergies.  Home Medications  No current outpatient prescriptions on file.  BP 128/75  Pulse 89  Temp 98.1 F (36.7 C) (Oral)  Resp 18  SpO2 100%  LMP 01/13/2012  Physical Exam  Nursing note and vitals reviewed. Constitutional:  She is oriented to person, place, and time. She appears well-developed and well-nourished. No distress.  HENT:  Head: Normocephalic and atraumatic.  Eyes: Conjunctivae normal and EOM are normal.  Neck: Normal range of motion. Neck supple.  Cardiovascular: Normal rate, regular rhythm and intact distal pulses.   No murmur heard. Pulmonary/Chest: Effort normal and breath sounds normal. No respiratory distress.  Abdominal: Soft. Bowel sounds are normal. She exhibits no distension.  Musculoskeletal: Normal range of motion. She exhibits no edema and no tenderness.  Neurological: She is alert and oriented to person, place, and time.  Skin: Skin is warm and dry.  Psychiatric: She has a normal mood and affect. Thought content normal.    ED Course  Procedures (including critical care time) 26 year old, female, who still smells intoxicated after drinking last night.  Presents with a headache, and numbness in her right hand.  She states she fainted.  However, there is no physical evidence of a significant injury.  We will perform laboratory testing, including a blood alcohol level, and I will give her Toradol and IV fluids for his symptoms  Labs Reviewed  URINALYSIS, ROUTINE W REFLEX MICROSCOPIC - Abnormal; Notable for the following:    Specific Gravity, Urine 1.002 (*)     All other components within normal limits  POCT PREGNANCY, URINE   No results found.   No diagnosis found.    MDM  Headache  Cheri Guppy, MD 03/07/12 1123

## 2012-03-07 NOTE — ED Notes (Signed)
Lab unable to draw blood, spoke with EDP. Will cancel orders. Pt states she is feeling better and is ready to go home.

## 2012-05-25 ENCOUNTER — Emergency Department (HOSPITAL_COMMUNITY)
Admission: EM | Admit: 2012-05-25 | Discharge: 2012-05-25 | Disposition: A | Discharge status: Home / Self Care | Payer: Self-pay | Attending: Emergency Medicine | Admitting: Emergency Medicine

## 2012-05-25 ENCOUNTER — Encounter (HOSPITAL_COMMUNITY): Payer: Self-pay | Admitting: Emergency Medicine

## 2012-05-25 DIAGNOSIS — Z202 Contact with and (suspected) exposure to infections with a predominantly sexual mode of transmission: Secondary | ICD-10-CM | POA: Insufficient documentation

## 2012-05-25 DIAGNOSIS — F17200 Nicotine dependence, unspecified, uncomplicated: Secondary | ICD-10-CM | POA: Insufficient documentation

## 2012-05-25 DIAGNOSIS — Z8659 Personal history of other mental and behavioral disorders: Secondary | ICD-10-CM | POA: Insufficient documentation

## 2012-05-25 DIAGNOSIS — Z3202 Encounter for pregnancy test, result negative: Secondary | ICD-10-CM | POA: Insufficient documentation

## 2012-05-25 DIAGNOSIS — J45909 Unspecified asthma, uncomplicated: Secondary | ICD-10-CM | POA: Insufficient documentation

## 2012-05-25 DIAGNOSIS — R109 Unspecified abdominal pain: Secondary | ICD-10-CM | POA: Insufficient documentation

## 2012-05-25 HISTORY — DX: Panic disorder (episodic paroxysmal anxiety): F41.0

## 2012-05-25 LAB — URINE MICROSCOPIC-ADD ON

## 2012-05-25 LAB — URINALYSIS, ROUTINE W REFLEX MICROSCOPIC
Bilirubin Urine: NEGATIVE
Glucose, UA: NEGATIVE mg/dL
Hgb urine dipstick: NEGATIVE
Ketones, ur: NEGATIVE mg/dL
Protein, ur: NEGATIVE mg/dL
pH: 5.5 (ref 5.0–8.0)

## 2012-05-25 LAB — WET PREP, GENITAL
Trich, Wet Prep: NONE SEEN
Yeast Wet Prep HPF POC: NONE SEEN

## 2012-05-25 LAB — RPR: RPR Ser Ql: NONREACTIVE

## 2012-05-25 MED ORDER — AZITHROMYCIN 250 MG PO TABS
1000.0000 mg | ORAL_TABLET | Freq: Once | ORAL | Status: AC
  Administered 2012-05-25: 1000 mg via ORAL
  Filled 2012-05-25: qty 4

## 2012-05-25 MED ORDER — LIDOCAINE HCL (PF) 1 % IJ SOLN
INTRAMUSCULAR | Status: AC
  Administered 2012-05-25: 2 mL
  Filled 2012-05-25: qty 5

## 2012-05-25 MED ORDER — ONDANSETRON 4 MG PO TBDP
4.0000 mg | ORAL_TABLET | Freq: Once | ORAL | Status: AC
  Administered 2012-05-25: 4 mg via ORAL
  Filled 2012-05-25: qty 1

## 2012-05-25 MED ORDER — CEFTRIAXONE SODIUM 250 MG IJ SOLR
250.0000 mg | Freq: Once | INTRAMUSCULAR | Status: AC
  Administered 2012-05-25: 250 mg via INTRAMUSCULAR
  Filled 2012-05-25: qty 250

## 2012-05-25 NOTE — ED Notes (Signed)
The patient states that she is not exhibiting any symptoms of having an STD.  She is here because her former partner was exhibiting symptoms (n.o.s.), and he came in for a checkup.

## 2012-05-25 NOTE — ED Notes (Signed)
The patient is AOx4 and comfortable with her discharge instructions. 

## 2012-05-25 NOTE — ED Notes (Signed)
Pt c/o possible exposure to STD from female partner; pt denies sx at present

## 2012-05-25 NOTE — ED Provider Notes (Signed)
History     CSN: [id number]  Arrival date & time 05/25/12  1425   First MD Initiated Contact with Patient 05/25/12 1520      Chief Complaint  Patient presents with  . Exposure to STD    (Consider location/radiation/quality/duration/timing/severity/associated sxs/prior treatment) HPI Comments: Boyfriend treated for STD earlier today.   Patient is a 26 y.o. female presenting with STD exposure. The history is provided by the patient.  Exposure to STD This is a new problem. The current episode started today. Episode frequency: once. The problem has been unchanged. Associated symptoms include abdominal pain (mild, with sitting up). Pertinent negatives include no chills, coughing or fever. Nothing aggravates the symptoms. She has tried nothing for the symptoms.    Past Medical History  Diagnosis Date  . Asthma   . Panic disorder     History reviewed. No pertinent past surgical history.  History reviewed. No pertinent family history.  History  Substance Use Topics  . Smoking status: Current Every Day Smoker -- 0.5 packs/day  . Smokeless tobacco: Not on file  . Alcohol Use: Yes     Comment: occaisional     OB History    Grav Para Term Preterm Abortions TAB SAB Ect Mult Living   3 2              Review of Systems  Constitutional: Negative for fever and chills.  Respiratory: Negative for cough and shortness of breath.   Gastrointestinal: Positive for abdominal pain (mild, with sitting up).  All other systems reviewed and are negative.    Allergies  Review of patient's allergies indicates no known allergies.  Home Medications  No current outpatient prescriptions on file.  BP 107/63  Pulse 67  Temp 99.2 F (37.3 C) (Oral)  Resp 18  Ht 5\' 2"  (1.575 m)  Wt 165 lb (74.844 kg)  BMI 30.18 kg/m2  SpO2 100%  Physical Exam  Nursing note and vitals reviewed. Constitutional: She is oriented to person, place, and time. She appears well-developed and well-nourished.  No distress.  HENT:  Head: Normocephalic and atraumatic.  Eyes: EOM are normal. Pupils are equal, round, and reactive to light.  Neck: Normal range of motion. Neck supple.  Cardiovascular: Normal rate and regular rhythm.  Exam reveals no friction rub.   No murmur heard. Pulmonary/Chest: Effort normal and breath sounds normal. No respiratory distress. She has no wheezes. She has no rales.  Abdominal: Soft. She exhibits no distension. There is no tenderness. There is no rebound.  Genitourinary: There is no rash, tenderness or lesion on the right labia. There is no rash, tenderness or lesion on the left labia. Cervix exhibits discharge (mild, clear). Cervix exhibits no motion tenderness and no friability. Right adnexum displays no mass, no tenderness and no fullness. Left adnexum displays no mass, no tenderness and no fullness. No erythema, tenderness or bleeding around the vagina. No foreign body around the vagina. No signs of injury around the vagina. No vaginal discharge found.  Musculoskeletal: Normal range of motion. She exhibits no edema.  Neurological: She is alert and oriented to person, place, and time.  Skin: She is not diaphoretic.    ED Course  Procedures (including critical care time)  Labs Reviewed  URINALYSIS, ROUTINE W REFLEX MICROSCOPIC - Abnormal; Notable for the following:    APPearance CLOUDY (*)     Leukocytes, UA LARGE (*)     All other components within normal limits  URINE MICROSCOPIC-ADD ON - Abnormal; Notable for  the following:    Squamous Epithelial / LPF MANY (*)     Bacteria, UA MANY (*)     All other components within normal limits  POCT PREGNANCY, URINE  HIV ANTIBODY (ROUTINE TESTING)  WET PREP, GENITAL  GC/CHLAMYDIA PROBE AMP  RPR  URINE CULTURE   No results found.   1. Potential exposure to STD       MDM  27 year old female presents with concern for exposure to STD. Her newborn friend who is treated here earlier today for gonorrhea and  chlamydia. She is asymptomatic and is not having any type of discharge or pelvic pain. She does feel like she is gonorrhea because she's had some stomach pain. Patient states pain which tries to sit up and flex her abdomen. Patient's belly soft and nontender on my exam. Her abdomen is benign. Pelvic exam with mild clear discharge from os, no tenderness.  Hold ahead and treat her with azithromycin and ceftriaxone. I will do a pelvic to swab for Trichomonas and test her blood for syphillis and HIV.  Patient's urine is contaminated with many squamous cells. She's asymptomatic and reports no dysuria. I will not treat the large leukocytes in her UA. The patient's wet prep was clipped. The patient requires another swab. She is declines this at this time. I explained to her that I did not think she had Trichomonas based on her clinical findings, however I cannot be entirely sure. I explained to her she should get this followed up/reexamined at her followup for her HIV and syphilis testing. Patient comfortable with all this plan. Discharged home in stable condition.  Elwin Mocha, MD 05/25/12 1736

## 2012-05-25 NOTE — ED Notes (Signed)
Pt undressed, in gown, blanket given; family at bedside

## 2012-05-25 NOTE — ED Notes (Addendum)
Anson Fret, Res MD with pelvic examination and collection of vaginal swabs; swabs collected and sent to lab for testing

## 2012-05-26 LAB — URINE CULTURE

## 2012-05-26 NOTE — ED Provider Notes (Signed)
  I performed a history and physical examination of Monica Holloway and discussed her management with Dr. Gwendolyn Grant.  I agree with the history, physical, assessment, and plan of care, with the following exceptions: None  On my exam she was in no distress.  Elyse Jarvis, MD 05/26/12 [phone]

## 2012-06-07 ENCOUNTER — Other Ambulatory Visit: Payer: Self-pay

## 2012-06-07 ENCOUNTER — Encounter (HOSPITAL_COMMUNITY): Payer: Self-pay | Admitting: Emergency Medicine

## 2012-06-07 ENCOUNTER — Emergency Department (HOSPITAL_COMMUNITY)
Admission: EM | Admit: 2012-06-07 | Discharge: 2012-06-07 | Disposition: A | Discharge status: Home / Self Care | Payer: Self-pay | Attending: Emergency Medicine | Admitting: Emergency Medicine

## 2012-06-07 DIAGNOSIS — F1010 Alcohol abuse, uncomplicated: Secondary | ICD-10-CM | POA: Insufficient documentation

## 2012-06-07 DIAGNOSIS — F10129 Alcohol abuse with intoxication, unspecified: Secondary | ICD-10-CM

## 2012-06-07 DIAGNOSIS — F1910 Other psychoactive substance abuse, uncomplicated: Secondary | ICD-10-CM

## 2012-06-07 DIAGNOSIS — F411 Generalized anxiety disorder: Secondary | ICD-10-CM | POA: Insufficient documentation

## 2012-06-07 DIAGNOSIS — F1210 Cannabis abuse, uncomplicated: Secondary | ICD-10-CM | POA: Insufficient documentation

## 2012-06-07 DIAGNOSIS — F419 Anxiety disorder, unspecified: Secondary | ICD-10-CM

## 2012-06-07 DIAGNOSIS — Z79899 Other long term (current) drug therapy: Secondary | ICD-10-CM | POA: Insufficient documentation

## 2012-06-07 DIAGNOSIS — F17200 Nicotine dependence, unspecified, uncomplicated: Secondary | ICD-10-CM | POA: Insufficient documentation

## 2012-06-07 DIAGNOSIS — R002 Palpitations: Secondary | ICD-10-CM | POA: Insufficient documentation

## 2012-06-07 DIAGNOSIS — F1410 Cocaine abuse, uncomplicated: Secondary | ICD-10-CM | POA: Insufficient documentation

## 2012-06-07 DIAGNOSIS — J45909 Unspecified asthma, uncomplicated: Secondary | ICD-10-CM | POA: Insufficient documentation

## 2012-06-07 DIAGNOSIS — Z3202 Encounter for pregnancy test, result negative: Secondary | ICD-10-CM | POA: Insufficient documentation

## 2012-06-07 LAB — URINALYSIS, ROUTINE W REFLEX MICROSCOPIC
Glucose, UA: NEGATIVE mg/dL
Ketones, ur: 15 mg/dL — AB
Nitrite: NEGATIVE
Protein, ur: 100 mg/dL — AB
pH: 7 (ref 5.0–8.0)

## 2012-06-07 LAB — URINE MICROSCOPIC-ADD ON

## 2012-06-07 LAB — PREGNANCY, URINE: Preg Test, Ur: NEGATIVE

## 2012-06-07 MED ORDER — ACETAMINOPHEN 325 MG PO TABS
650.0000 mg | ORAL_TABLET | Freq: Once | ORAL | Status: AC
  Administered 2012-06-07: 650 mg via ORAL
  Filled 2012-06-07: qty 2

## 2012-06-07 MED ORDER — ONDANSETRON 4 MG PO TBDP
8.0000 mg | ORAL_TABLET | Freq: Once | ORAL | Status: AC
  Administered 2012-06-07: 8 mg via ORAL
  Filled 2012-06-07: qty 2

## 2012-06-07 MED ORDER — ONDANSETRON HCL 4 MG PO TABS: 4.0000 mg | ORAL_TABLET | Freq: Three times a day (TID) | ORAL | Status: DC | PRN

## 2012-06-07 NOTE — ED Notes (Signed)
MD at bedside. 

## 2012-06-07 NOTE — ED Provider Notes (Signed)
History     CSN: [id number]  Arrival date & time 06/07/12  0306   First MD Initiated Contact with Patient 06/07/12 [phone]      Chief Complaint  Patient presents with  . Panic Attack    (Consider location/radiation/quality/duration/timing/severity/associated sxs/prior treatment) HPI 26 year old female presents to emergency department via EMS with report of palpitations and anxiety. Patient reports last night she went to a party with friends. She reports drinking alcohol heavily, using cocaine and marijuana. She went home after being up all night. She reports she slept some during the day, ate normally. About an hour prior to arrival she began to feel anxious. She has a history of same in the past. She told EMS that she might be pregnant, but now feels that she is not as she's had vaginal bleeding. Patient with several ER evaluations in the past for similar symptoms.  Past Medical History  Diagnosis Date  . Asthma   . Panic disorder     History reviewed. No pertinent past surgical history.  No family history on file.  History  Substance Use Topics  . Smoking status: Current Every Day Smoker -- 0.5 packs/day  . Smokeless tobacco: Not on file  . Alcohol Use: Yes     Comment: occaisional     OB History    Grav Para Term Preterm Abortions TAB SAB Ect Mult Living   3 2              Review of Systems  All other systems reviewed and are negative.    Allergies  Review of patient's allergies indicates no known allergies.  Home Medications   Current Outpatient Rx  Name  Route  Sig  Dispense  Refill  . SERTRALINE HCL PO   Oral   Take 1 tablet by mouth daily.           BP 119/76  Pulse 85  Temp 98.4 F (36.9 C) (Oral)  Resp 18  SpO2 100%  LMP 06/07/2012  Physical Exam  Nursing note and vitals reviewed. Constitutional: She is oriented to person, place, and time. She appears well-developed and well-nourished.  HENT:  Head: Normocephalic and atraumatic.  Right  Ear: External ear normal.  Left Ear: External ear normal.  Nose: Nose normal.  Mouth/Throat: Oropharynx is clear and moist.  Eyes: Conjunctivae normal and EOM are normal. Pupils are equal, round, and reactive to light.  Neck: Normal range of motion. Neck supple. No JVD present. No tracheal deviation present. No thyromegaly present.  Cardiovascular: Normal rate, regular rhythm, normal heart sounds and intact distal pulses.  Exam reveals no gallop and no friction rub.   No murmur heard. Pulmonary/Chest: Effort normal and breath sounds normal. No stridor. No respiratory distress. She has no wheezes. She has no rales. She exhibits no tenderness.  Abdominal: Soft. Bowel sounds are normal. She exhibits no distension and no mass. There is no tenderness. There is no rebound and no guarding.  Musculoskeletal: Normal range of motion. She exhibits no edema and no tenderness.  Lymphadenopathy:    She has no cervical adenopathy.  Neurological: She is alert and oriented to person, place, and time. She exhibits normal muscle tone. Coordination normal.  Skin: Skin is warm and dry. No rash noted. No erythema. No pallor.  Psychiatric: She has a normal mood and affect. Her behavior is normal. Judgment and thought content normal.    ED Course  Procedures (including critical care time)  Labs Reviewed  URINALYSIS, ROUTINE W REFLEX  MICROSCOPIC - Abnormal; Notable for the following:    Color, Urine RED (*)  BIOCHEMICALS MAY BE AFFECTED BY COLOR   APPearance TURBID (*)     Specific Gravity, Urine 1.043 (*)     Hgb urine dipstick LARGE (*)     Bilirubin Urine MODERATE (*)     Ketones, ur 15 (*)     Protein, ur 100 (*)     Leukocytes, UA MODERATE (*)     All other components within normal limits  URINE MICROSCOPIC-ADD ON - Abnormal; Notable for the following:    Bacteria, UA MANY (*)     All other components within normal limits  PREGNANCY, URINE  URINE CULTURE   No results found.   1. Polysubstance  abuse   2. Anxiety   3. Hangover effect       MDM  26 year old female with recent polysubstance abuse. She is complaining of palpitations and anxiety. Later complained of nausea and headache. She has received Zofran. She is not pregnant. She has blood in her urine as well as leukocyte esterase and white blood cells. I think the contaminated catch given her menses. We'll not treat at this time and wait for culture. Patient has been counseled that she needs to stop her polysubstance abuse especially since she has an underlying panic disorder. Patient has tolerated by mouth fluids. Will send home to take Tylenol or Motrin as needed for headache. We'll give prescription for Zofran.       Olivia Mackie, MD 06/07/12 [phone]

## 2012-06-07 NOTE — ED Notes (Signed)
Pt states she is feeling nauseated and dizziness. Pt safely in bed with rails up. Pt redirected not to get out of bed. Pt alert and oriented.

## 2012-06-07 NOTE — ED Notes (Signed)
Pt states she do not want anyone to know she is here in the ED. Pt status changed to XXX per patient request. Pt states she only wants her boyfriend Monica Holloway in the room but asked to leave whenever results or her care is concerned.

## 2012-06-07 NOTE — ED Notes (Signed)
Pt states she used cocaine, weed and alcohol socially with friends at a party last night. Pt states about an hour ago she felt SOB, chest pain and anxiety that did not go away. Pt states she has been off her Zoloft for over a week because she did not like how it made her feel. EMS states patient is 4 weeks preggo. Pt denies being pregnant. Pt states she has a headache 10/10 and states she uses Goodie powder for relief.

## 2012-06-08 LAB — URINE CULTURE: Colony Count: 15000

## 2012-06-13 ENCOUNTER — Encounter (HOSPITAL_COMMUNITY): Payer: Self-pay | Admitting: Emergency Medicine

## 2012-06-13 ENCOUNTER — Emergency Department (HOSPITAL_COMMUNITY)
Admission: EM | Admit: 2012-06-13 | Discharge: 2012-06-14 | Discharge status: Against Medical Advice | Payer: No Typology Code available for payment source | Attending: Emergency Medicine | Admitting: Emergency Medicine

## 2012-06-13 DIAGNOSIS — IMO0002 Reserved for concepts with insufficient information to code with codable children: Secondary | ICD-10-CM | POA: Insufficient documentation

## 2012-06-13 DIAGNOSIS — Y92410 Unspecified street and highway as the place of occurrence of the external cause: Secondary | ICD-10-CM | POA: Insufficient documentation

## 2012-06-13 DIAGNOSIS — S0993XA Unspecified injury of face, initial encounter: Secondary | ICD-10-CM | POA: Insufficient documentation

## 2012-06-13 DIAGNOSIS — Y9389 Activity, other specified: Secondary | ICD-10-CM | POA: Insufficient documentation

## 2012-06-13 NOTE — ED Notes (Signed)
Pt called for room, no answer.
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
## 2012-06-13 NOTE — ED Notes (Signed)
Pt called for room, no ans x 3

## 2012-06-13 NOTE — ED Notes (Signed)
Pt called for room, no ans x 2

## 2012-07-10 ENCOUNTER — Emergency Department (HOSPITAL_COMMUNITY)
Admission: EM | Admit: 2012-07-10 | Discharge: 2012-07-10 | Disposition: A | Discharge status: Home / Self Care | Payer: No Typology Code available for payment source

## 2012-07-10 NOTE — ED Notes (Signed)
No answer for triage.

## 2012-07-11 ENCOUNTER — Encounter (HOSPITAL_COMMUNITY): Payer: Self-pay | Admitting: Emergency Medicine

## 2012-07-11 ENCOUNTER — Emergency Department (HOSPITAL_COMMUNITY)
Admission: EM | Admit: 2012-07-11 | Discharge: 2012-07-11 | Disposition: A | Discharge status: Home / Self Care | Payer: Self-pay | Attending: Emergency Medicine | Admitting: Emergency Medicine

## 2012-07-11 DIAGNOSIS — F1210 Cannabis abuse, uncomplicated: Secondary | ICD-10-CM | POA: Insufficient documentation

## 2012-07-11 DIAGNOSIS — F411 Generalized anxiety disorder: Secondary | ICD-10-CM | POA: Insufficient documentation

## 2012-07-11 DIAGNOSIS — J45909 Unspecified asthma, uncomplicated: Secondary | ICD-10-CM | POA: Insufficient documentation

## 2012-07-11 DIAGNOSIS — L299 Pruritus, unspecified: Secondary | ICD-10-CM | POA: Insufficient documentation

## 2012-07-11 DIAGNOSIS — R21 Rash and other nonspecific skin eruption: Secondary | ICD-10-CM | POA: Insufficient documentation

## 2012-07-11 DIAGNOSIS — F1410 Cocaine abuse, uncomplicated: Secondary | ICD-10-CM | POA: Insufficient documentation

## 2012-07-11 DIAGNOSIS — Z79899 Other long term (current) drug therapy: Secondary | ICD-10-CM | POA: Insufficient documentation

## 2012-07-11 DIAGNOSIS — F17200 Nicotine dependence, unspecified, uncomplicated: Secondary | ICD-10-CM | POA: Insufficient documentation

## 2012-07-11 MED ORDER — DIPHENHYDRAMINE HCL 25 MG PO TABS: 25.0000 mg | ORAL_TABLET | Freq: Four times a day (QID) | ORAL | Status: DC

## 2012-07-11 MED ORDER — PERMETHRIN 5 % EX CREA: TOPICAL_CREAM | CUTANEOUS | Status: DC

## 2012-07-11 NOTE — ED Provider Notes (Signed)
History     CSN: [id number]  Arrival date & time 07/11/12  1059   First MD Initiated Contact with Patient 07/11/12 1130      No chief complaint on file.   (Consider location/radiation/quality/duration/timing/severity/associated sxs/prior treatment) HPI  27 year old female hx of asthma and panic disorder presents for evaluation of itching.  Pt reports she recently moved out of her hotel last week.  Has been having itching throughout body for the past 2 days. She notices red bumps on her skin especially on her forearm, thigh, and legs.  Onset gradual, persistent, moderate in severity. Sts her husband is having the same rash as well.  Denies fever, sore throat, throat swelling, cp, sob.  Denies changes in shampoo, detergent, or having new pets.  No medication changes.    Past Medical History  Diagnosis Date  . Asthma   . Panic disorder     History reviewed. No pertinent past surgical history.  No family history on file.  History  Substance Use Topics  . Smoking status: Current Every Day Smoker -- 0.5 packs/day  . Smokeless tobacco: Not on file  . Alcohol Use: Yes     Comment: occaisional     OB History    Grav Para Term Preterm Abortions TAB SAB Ect Mult Living   3 2              Review of Systems  Constitutional: Negative for fever.       10 Systems reviewed and all are negative for acute change except as noted in the HPI.   HENT: Negative for trouble swallowing.   Musculoskeletal: Negative for arthralgias.  Skin: Positive for rash.  Neurological: Negative for numbness.    Allergies  Review of patient's allergies indicates no known allergies.  Home Medications   Current Outpatient Rx  Name  Route  Sig  Dispense  Refill  . SERTRALINE HCL PO   Oral   Take 1 tablet by mouth daily.           BP 118/85  Pulse 68  Temp 98 F (36.7 C)  SpO2 99%  Physical Exam  Nursing note and vitals reviewed. Constitutional: She appears well-developed and  well-nourished. No distress.  HENT:  Head: Atraumatic.  Mouth/Throat: Oropharynx is clear and moist.  Eyes: Conjunctivae normal are normal.  Neck: Normal range of motion. Neck supple.  Cardiovascular: Normal rate and regular rhythm.   Pulmonary/Chest: Breath sounds normal. She has no wheezes.  Abdominal: Soft.  Musculoskeletal: She exhibits no edema.  Neurological: She is alert.  Skin: Skin is warm. Rash (pt has several erythematous papular rash noted to both forearms, abdomen, L inner thigh, and R lower leg.  Non petechial, pustular, or vesicular lesion.) noted.  Psychiatric: She has a normal mood and affect.    ED Course  Procedures (including critical care time)  Labs Reviewed - No data to display No results found.   No diagnosis found.  1. rash   MDM  Pt presents with red papular rash and excoriation to various body parts.  None on web of fingers or toes.  No rash to palm of hands or sole of foot.  No systemic involvement.  No red flags.  Will treat with promethrin cream, and benadryl.  Pt recommend to wash all belonging in hot water and to leave hotel.  Pt voice understanding and agrees with plan.    BP 118/85  Pulse 68  Temp 98 F (36.7 C)  SpO2  99%  I have reviewed nursing notes and vital signs.   I reviewed available ER/hospitalization records thought the EMR       Fayrene Helper, [place] 07/11/12 1225

## 2012-07-11 NOTE — ED Notes (Signed)
States moved out of her hotel last week now having itching but her anxiety is also making her itch and she is twitches she staes

## 2012-07-11 NOTE — ED Notes (Signed)
Pt lives in a motel. States she, her husband and her friend are all itching. Very minimal rash noted to arms and inner thighs.

## 2012-07-11 NOTE — ED Provider Notes (Signed)
Medical screening examination/treatment/procedure(s) were performed by non-physician practitioner and as supervising physician I was immediately available for consultation/collaboration.   Batool Majid, MD 07/11/12 1546 

## 2012-07-25 ENCOUNTER — Emergency Department (HOSPITAL_COMMUNITY)
Admission: EM | Admit: 2012-07-25 | Discharge: 2012-07-25 | Disposition: A | Discharge status: Home / Self Care | Payer: Self-pay | Attending: Emergency Medicine | Admitting: Emergency Medicine

## 2012-07-25 DIAGNOSIS — F410 Panic disorder [episodic paroxysmal anxiety] without agoraphobia: Secondary | ICD-10-CM | POA: Insufficient documentation

## 2012-07-25 DIAGNOSIS — F419 Anxiety disorder, unspecified: Secondary | ICD-10-CM

## 2012-07-25 DIAGNOSIS — J45909 Unspecified asthma, uncomplicated: Secondary | ICD-10-CM | POA: Insufficient documentation

## 2012-07-25 DIAGNOSIS — F17200 Nicotine dependence, unspecified, uncomplicated: Secondary | ICD-10-CM | POA: Insufficient documentation

## 2012-07-25 DIAGNOSIS — F411 Generalized anxiety disorder: Secondary | ICD-10-CM | POA: Insufficient documentation

## 2012-07-25 MED ORDER — LORAZEPAM 1 MG PO TABS: 1.0000 mg | ORAL_TABLET | Freq: Once | ORAL | Status: DC

## 2012-07-25 MED ORDER — LORAZEPAM 1 MG PO TABS: 1.0000 mg | ORAL_TABLET | Freq: Three times a day (TID) | ORAL | Status: DC | PRN

## 2012-07-25 NOTE — ED Notes (Signed)
Pt states hx: anxiety attacks. Starting yesterday the attacks progressively increased in number, having them back to back. Currently does not take medication for them.
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
## 2012-07-25 NOTE — ED Notes (Signed)
Pt currently not feeling anxious

## 2012-07-25 NOTE — ED Provider Notes (Signed)
Medical screening examination/treatment/procedure(s) were performed by non-physician practitioner and as supervising physician I was immediately available for consultation/collaboration.   Deshay Blumenfeld, MD 07/25/12 2335 

## 2012-08-03 ENCOUNTER — Emergency Department (HOSPITAL_COMMUNITY)
Admission: EM | Admit: 2012-08-03 | Discharge: 2012-08-03 | Disposition: A | Discharge status: Home / Self Care | Payer: No Typology Code available for payment source

## 2012-08-03 NOTE — ED Notes (Signed)
Pt called again and no answer.

## 2012-08-03 NOTE — ED Notes (Signed)
Pt called for triage and not answering  

## 2012-10-03 ENCOUNTER — Emergency Department (HOSPITAL_COMMUNITY)
Admission: EM | Admit: 2012-10-03 | Discharge: 2012-10-03 | Disposition: A | Discharge status: Home / Self Care | Payer: Self-pay | Attending: Emergency Medicine | Admitting: Emergency Medicine

## 2012-10-03 ENCOUNTER — Emergency Department (HOSPITAL_COMMUNITY): Payer: Self-pay

## 2012-10-03 ENCOUNTER — Encounter (HOSPITAL_COMMUNITY): Payer: Self-pay | Admitting: *Deleted

## 2012-10-03 DIAGNOSIS — J45909 Unspecified asthma, uncomplicated: Secondary | ICD-10-CM | POA: Insufficient documentation

## 2012-10-03 DIAGNOSIS — F17200 Nicotine dependence, unspecified, uncomplicated: Secondary | ICD-10-CM | POA: Insufficient documentation

## 2012-10-03 DIAGNOSIS — F1910 Other psychoactive substance abuse, uncomplicated: Secondary | ICD-10-CM

## 2012-10-03 DIAGNOSIS — F1410 Cocaine abuse, uncomplicated: Secondary | ICD-10-CM | POA: Insufficient documentation

## 2012-10-03 DIAGNOSIS — R0602 Shortness of breath: Secondary | ICD-10-CM | POA: Insufficient documentation

## 2012-10-03 DIAGNOSIS — F411 Generalized anxiety disorder: Secondary | ICD-10-CM | POA: Insufficient documentation

## 2012-10-03 DIAGNOSIS — R51 Headache: Secondary | ICD-10-CM

## 2012-10-03 DIAGNOSIS — F419 Anxiety disorder, unspecified: Secondary | ICD-10-CM

## 2012-10-03 DIAGNOSIS — R0789 Other chest pain: Secondary | ICD-10-CM | POA: Insufficient documentation

## 2012-10-03 DIAGNOSIS — R079 Chest pain, unspecified: Secondary | ICD-10-CM

## 2012-10-03 HISTORY — DX: Panic disorder (episodic paroxysmal anxiety): F41.0

## 2012-10-03 LAB — CBC
HCT: 39.7 % (ref 36.0–46.0)
MCV: 92.3 fL (ref 78.0–100.0)
RDW: 12.3 % (ref 11.5–15.5)
WBC: 6.8 10*3/uL (ref 4.0–10.5)

## 2012-10-03 LAB — POCT I-STAT, CHEM 8
Calcium, Ion: 1.16 mmol/L (ref 1.12–1.23)
Chloride: 108 mEq/L (ref 96–112)
Glucose, Bld: 104 mg/dL — ABNORMAL HIGH (ref 70–99)
HCT: 44 % (ref 36.0–46.0)

## 2012-10-03 MED ORDER — LORAZEPAM 1 MG PO TABS
1.0000 mg | ORAL_TABLET | Freq: Once | ORAL | Status: AC
  Administered 2012-10-03: 1 mg via ORAL
  Filled 2012-10-03: qty 1

## 2012-10-03 MED ORDER — IBUPROFEN 800 MG PO TABS
800.0000 mg | ORAL_TABLET | Freq: Once | ORAL | Status: AC
  Administered 2012-10-03: 800 mg via ORAL
  Filled 2012-10-03: qty 1

## 2012-10-03 MED ORDER — ONDANSETRON 4 MG PO TBDP
8.0000 mg | ORAL_TABLET | Freq: Once | ORAL | Status: AC
  Administered 2012-10-03: 8 mg via ORAL
  Filled 2012-10-03: qty 2

## 2012-10-03 NOTE — ED Notes (Addendum)
Pt with hx of panic attack states sternal chest pain and headache.  Denies sob.  States feels like past panic attacks.  Pt last did cocaine last night.

## 2012-10-03 NOTE — ED Provider Notes (Signed)
History     CSN: [id number]  Arrival date & time 10/03/12  1229   First MD Initiated Contact with Patient 10/03/12 1504      Chief Complaint  Patient presents with  . Panic Attack  . Chest Pain     Patient is a 27 y.o. female presenting with anxiety. The history is provided by the patient.  Anxiety This is a recurrent problem. The current episode started 3 to 5 hours ago. The problem occurs constantly. The problem has been gradually improving. Associated symptoms include chest pain, headaches and shortness of breath. Pertinent negatives include no abdominal pain. Exacerbated by: stress. Nothing relieves the symptoms. Treatments tried: OTC headache meds. The treatment provided mild relief.  pt reports that she started to have a panic attack this morning.  She reports chest tightness, short of breath and palpitations.  She reports she felt very anxious at that time.  No syncope.  No focal weakness.  She reports long h/o panic attacks and similar to prior.  She denies known h/o cardiac disease and no h/o PE.     She reports onset of mild headache last night.  No focal weakness.  No visual changes.  It is similar to prior headaches  She reports using/snorting cocaine last night.  She reports she is an occasional cocaine user.  She denies immediate cp/sob/headache after cocaine use last night.  She reports she is planning to quit cigarettes/etoh/cocaine.  Past Medical History  Diagnosis Date  . Asthma   . Panic disorder   . Panic attacks     History reviewed. No pertinent past surgical history.  No family history on file.  History  Substance Use Topics  . Smoking status: Current Every Day Smoker -- 0.50 packs/day  . Smokeless tobacco: Not on file  . Alcohol Use: Yes     Comment: occaisional     OB History   Grav Para Term Preterm Abortions TAB SAB Ect Mult Living   3 2              Review of Systems  Constitutional: Negative for fever.  Respiratory: Positive for  shortness of breath.   Cardiovascular: Positive for chest pain.  Gastrointestinal: Positive for vomiting. Negative for abdominal pain and diarrhea.  Skin: Negative for color change.  Neurological: Positive for headaches. Negative for syncope and weakness.  Psychiatric/Behavioral: The patient is nervous/anxious.   All other systems reviewed and are negative.    Allergies  Review of patient's allergies indicates no known allergies.  Home Medications  No current outpatient prescriptions on file.  BP 133/94  Pulse 87  Temp(Src) 98.8 F (37.1 C) (Oral)  Resp 20  Ht 5\' 2"  (1.575 m)  Wt 165 lb (74.844 kg)  BMI 30.17 kg/m2  SpO2 99%  LMP 08/25/2012  Physical Exam CONSTITUTIONAL: Well developed/well nourished HEAD: Normocephalic/atraumatic EYES: EOMI/PERRL, no nystagmus ENMT: Mucous membranes moist NECK: supple no meningeal signs SPINE:entire spine nontender CV: S1/S2 noted, no murmurs/rubs/gallops noted LUNGS: Lungs are clear to auscultation bilaterally, no apparent distress ABDOMEN: soft, nontender, no rebound or guarding GU:no cva tenderness NEURO: Pt is awake/alert, moves all extremitiesx4 No arm/leg weakness noted.  No facial droop. EXTREMITIES: pulses normal, full ROM SKIN: warm, color normal PSYCH: mildly anxious  ED Course  Procedures  Labs Reviewed  POCT I-STAT, CHEM 8 - Abnormal; Notable for the following:    Glucose, Bld 104 (*)    All other components within normal limits  CBC  POCT I-STAT TROPONIN I  Dg Chest 2 View  10/03/2012  *RADIOLOGY REPORT*  Clinical Data: Chest pain.  Headache.  Panic attack.  CHEST - 2 VIEW  Comparison: Chest x-ray 08/10/2011.  Findings: Lung volumes are normal.  No consolidative airspace disease.  No pleural effusions.  No pneumothorax.  No pulmonary nodule or mass noted.  Pulmonary vasculature and the cardiomediastinal silhouette are within normal limits.  IMPRESSION: 1. No radiographic evidence of acute cardiopulmonary disease.    Original Report Authenticated By: Trudie Reed, M.D.      1. Anxiety   2. Headache   3. Substance abuse   4. Chest pain     Pt reports panic attack this morning that is improving.  She reports similar symptoms in the past EKG/CXR unchanged.  No elevation in troponin (reports cocaine use last night) so do not feel further troponin testing necessary Counseled on discontinuing substance abuse.  One dose of ativan given for her anxiety and but will not give any other controlled substances.  Outpatient referrals given to patient She is agreeable with plan    MDM  Nursing notes including past medical history and social history reviewed and considered in documentation Labs/vital reviewed and considered xrays reviewed and considered        Date: 10/03/2012  Rate: 88  Rhythm: normal sinus rhythm  QRS Axis: normal  Intervals: normal  ST/T Wave abnormalities: normal  Conduction Disutrbances:none  Narrative Interpretation:   Old EKG Reviewed: unchanged    Joya Gaskins, MD 10/03/12 1539

## 2012-12-27 ENCOUNTER — Encounter (HOSPITAL_COMMUNITY): Payer: Self-pay | Admitting: *Deleted

## 2012-12-27 ENCOUNTER — Emergency Department (HOSPITAL_COMMUNITY)
Admission: EM | Admit: 2012-12-27 | Discharge: 2012-12-28 | Disposition: A | Discharge status: Home / Self Care | Payer: Self-pay | Attending: Emergency Medicine | Admitting: Emergency Medicine

## 2012-12-27 DIAGNOSIS — N949 Unspecified condition associated with female genital organs and menstrual cycle: Secondary | ICD-10-CM | POA: Insufficient documentation

## 2012-12-27 DIAGNOSIS — R079 Chest pain, unspecified: Secondary | ICD-10-CM | POA: Insufficient documentation

## 2012-12-27 DIAGNOSIS — F17200 Nicotine dependence, unspecified, uncomplicated: Secondary | ICD-10-CM | POA: Insufficient documentation

## 2012-12-27 DIAGNOSIS — Z8659 Personal history of other mental and behavioral disorders: Secondary | ICD-10-CM | POA: Insufficient documentation

## 2012-12-27 DIAGNOSIS — R102 Pelvic and perineal pain: Secondary | ICD-10-CM

## 2012-12-27 DIAGNOSIS — J45909 Unspecified asthma, uncomplicated: Secondary | ICD-10-CM | POA: Insufficient documentation

## 2012-12-27 NOTE — ED Notes (Signed)
The pt started  Having chest pain after eating a sun approx 2 hours ago.  She also has soreness in her vagina after intercourse since June 11th.  lmp June 10

## 2012-12-28 ENCOUNTER — Emergency Department (HOSPITAL_COMMUNITY): Payer: Self-pay

## 2012-12-28 LAB — WET PREP, GENITAL
Trich, Wet Prep: NONE SEEN
Yeast Wet Prep HPF POC: NONE SEEN

## 2012-12-28 NOTE — ED Notes (Signed)
Patient transported to X-ray 

## 2012-12-28 NOTE — ED Notes (Signed)
Pt alert and oriented, with steady gait at time of discharge. Pt given discharge papers and papers explained. All questions answered and pt walked to discharge.  

## 2012-12-28 NOTE — ED Notes (Signed)
Pelvic cart set up at bedside  
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
## 2012-12-30 LAB — GC/CHLAMYDIA PROBE AMP: GC Probe RNA: NEGATIVE

## 2013-03-27 ENCOUNTER — Emergency Department (HOSPITAL_COMMUNITY)
Admission: EM | Admit: 2013-03-27 | Discharge: 2013-03-27 | Disposition: A | Discharge status: Home / Self Care | Payer: No Typology Code available for payment source | Attending: Emergency Medicine | Admitting: Emergency Medicine

## 2013-03-27 ENCOUNTER — Emergency Department (HOSPITAL_COMMUNITY): Payer: No Typology Code available for payment source

## 2013-03-27 ENCOUNTER — Encounter (HOSPITAL_COMMUNITY): Payer: Self-pay | Admitting: *Deleted

## 2013-03-27 DIAGNOSIS — Y92410 Unspecified street and highway as the place of occurrence of the external cause: Secondary | ICD-10-CM | POA: Insufficient documentation

## 2013-03-27 DIAGNOSIS — F17200 Nicotine dependence, unspecified, uncomplicated: Secondary | ICD-10-CM | POA: Insufficient documentation

## 2013-03-27 DIAGNOSIS — Z8659 Personal history of other mental and behavioral disorders: Secondary | ICD-10-CM | POA: Insufficient documentation

## 2013-03-27 DIAGNOSIS — B9689 Other specified bacterial agents as the cause of diseases classified elsewhere: Secondary | ICD-10-CM | POA: Insufficient documentation

## 2013-03-27 DIAGNOSIS — IMO0002 Reserved for concepts with insufficient information to code with codable children: Secondary | ICD-10-CM | POA: Insufficient documentation

## 2013-03-27 DIAGNOSIS — N760 Acute vaginitis: Secondary | ICD-10-CM

## 2013-03-27 DIAGNOSIS — S92912A Unspecified fracture of left toe(s), initial encounter for closed fracture: Secondary | ICD-10-CM

## 2013-03-27 DIAGNOSIS — A499 Bacterial infection, unspecified: Secondary | ICD-10-CM | POA: Insufficient documentation

## 2013-03-27 DIAGNOSIS — J45909 Unspecified asthma, uncomplicated: Secondary | ICD-10-CM | POA: Insufficient documentation

## 2013-03-27 DIAGNOSIS — Z792 Long term (current) use of antibiotics: Secondary | ICD-10-CM | POA: Insufficient documentation

## 2013-03-27 DIAGNOSIS — Y9301 Activity, walking, marching and hiking: Secondary | ICD-10-CM | POA: Insufficient documentation

## 2013-03-27 DIAGNOSIS — S92919A Unspecified fracture of unspecified toe(s), initial encounter for closed fracture: Secondary | ICD-10-CM | POA: Insufficient documentation

## 2013-03-27 LAB — URINALYSIS, ROUTINE W REFLEX MICROSCOPIC
Glucose, UA: NEGATIVE mg/dL
Specific Gravity, Urine: 1.036 — ABNORMAL HIGH (ref 1.005–1.030)
pH: 5.5 (ref 5.0–8.0)

## 2013-03-27 LAB — WET PREP, GENITAL: Yeast Wet Prep HPF POC: NONE SEEN

## 2013-03-27 LAB — URINE MICROSCOPIC-ADD ON

## 2013-03-27 MED ORDER — OXYCODONE-ACETAMINOPHEN 5-325 MG PO TABS: 1.0000 | ORAL_TABLET | ORAL | Status: DC | PRN

## 2013-03-27 MED ORDER — AZITHROMYCIN 1 G PO PACK
1.0000 g | PACK | Freq: Once | ORAL | Status: AC
  Administered 2013-03-27: 1 g via ORAL
  Filled 2013-03-27: qty 1

## 2013-03-27 MED ORDER — CEFTRIAXONE SODIUM 1 G IJ SOLR
1.0000 g | Freq: Once | INTRAMUSCULAR | Status: AC
  Administered 2013-03-27: 1 g via INTRAMUSCULAR
  Filled 2013-03-27: qty 10

## 2013-03-27 MED ORDER — METRONIDAZOLE 500 MG PO TABS: 500.0000 mg | ORAL_TABLET | Freq: Two times a day (BID) | ORAL | Status: DC

## 2013-03-27 MED ORDER — NAPROXEN 250 MG PO TABS
500.0000 mg | ORAL_TABLET | Freq: Once | ORAL | Status: AC
  Administered 2013-03-27: 500 mg via ORAL
  Filled 2013-03-27: qty 2

## 2013-03-27 MED ORDER — ONDANSETRON 4 MG PO TBDP
4.0000 mg | ORAL_TABLET | Freq: Once | ORAL | Status: AC
  Administered 2013-03-27: 4 mg via ORAL
  Filled 2013-03-27: qty 1
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
## 2013-03-28 NOTE — ED Provider Notes (Signed)
Medical screening examination/treatment/procedure(s) were performed by non-physician practitioner and as supervising physician I was immediately available for consultation/collaboration.   Glynn Octave, MD 03/28/13 [phone]

## 2013-03-29 NOTE — ED Notes (Signed)
+  Gonorrhea Patient treated with Rocephin and Zithromax-[facility] letter

## 2013-03-30 ENCOUNTER — Telehealth (HOSPITAL_COMMUNITY): Payer: Self-pay | Admitting: Emergency Medicine

## 2013-03-31 ENCOUNTER — Telehealth (HOSPITAL_COMMUNITY): Payer: Self-pay | Admitting: Emergency Medicine

## 2013-04-03 LAB — URINE CULTURE: Colony Count: >=100000

## 2013-04-07 ENCOUNTER — Telehealth (HOSPITAL_COMMUNITY): Payer: Self-pay | Admitting: Emergency Medicine

## 2013-04-07 NOTE — ED Notes (Signed)
Unable to contact patient via phone. Sent letter. °

## 2013-05-10 ENCOUNTER — Telehealth (HOSPITAL_COMMUNITY): Payer: Self-pay | Admitting: Emergency Medicine

## 2013-05-10 NOTE — ED Notes (Signed)
No response to letter sent after 30 days. Chart sent to Medical Records. °

## 2013-05-15 ENCOUNTER — Emergency Department (HOSPITAL_COMMUNITY)
Admission: EM | Admit: 2013-05-15 | Discharge: 2013-05-16 | Disposition: A | Discharge status: Home / Self Care | Payer: No Typology Code available for payment source | Attending: Emergency Medicine | Admitting: Emergency Medicine

## 2013-05-15 ENCOUNTER — Encounter (HOSPITAL_COMMUNITY): Payer: Self-pay | Admitting: Emergency Medicine

## 2013-05-15 DIAGNOSIS — S01411A Laceration without foreign body of right cheek and temporomandibular area, initial encounter: Secondary | ICD-10-CM

## 2013-05-15 DIAGNOSIS — Z79899 Other long term (current) drug therapy: Secondary | ICD-10-CM | POA: Insufficient documentation

## 2013-05-15 DIAGNOSIS — F411 Generalized anxiety disorder: Secondary | ICD-10-CM | POA: Insufficient documentation

## 2013-05-15 DIAGNOSIS — F17200 Nicotine dependence, unspecified, uncomplicated: Secondary | ICD-10-CM | POA: Insufficient documentation

## 2013-05-15 DIAGNOSIS — S21109A Unspecified open wound of unspecified front wall of thorax without penetration into thoracic cavity, initial encounter: Secondary | ICD-10-CM | POA: Insufficient documentation

## 2013-05-15 DIAGNOSIS — J45909 Unspecified asthma, uncomplicated: Secondary | ICD-10-CM | POA: Insufficient documentation

## 2013-05-15 DIAGNOSIS — S01409A Unspecified open wound of unspecified cheek and temporomandibular area, initial encounter: Secondary | ICD-10-CM | POA: Insufficient documentation

## 2013-05-15 DIAGNOSIS — S21119A Laceration without foreign body of unspecified front wall of thorax without penetration into thoracic cavity, initial encounter: Secondary | ICD-10-CM

## 2013-05-15 DIAGNOSIS — Z23 Encounter for immunization: Secondary | ICD-10-CM | POA: Insufficient documentation

## 2013-05-15 LAB — GLUCOSE, CAPILLARY: Glucose-Capillary: 95 mg/dL (ref 70–99)

## 2013-05-15 MED ORDER — OXYCODONE-ACETAMINOPHEN 5-325 MG PO TABS
1.0000 | ORAL_TABLET | Freq: Once | ORAL | Status: AC
  Administered 2013-05-15: 1 via ORAL
  Filled 2013-05-15: qty 1

## 2013-05-15 MED ORDER — TETANUS-DIPHTH-ACELL PERTUSSIS 5-2.5-18.5 LF-MCG/0.5 IM SUSP
0.5000 mL | Freq: Once | INTRAMUSCULAR | Status: AC
  Administered 2013-05-15: 0.5 mL via INTRAMUSCULAR
  Filled 2013-05-15: qty 0.5

## 2013-05-15 MED ORDER — LORAZEPAM 1 MG PO TABS
2.0000 mg | ORAL_TABLET | Freq: Once | ORAL | Status: AC
  Administered 2013-05-15: 2 mg via ORAL
  Filled 2013-05-15: qty 2

## 2013-05-15 MED ORDER — LORAZEPAM 2 MG/ML IJ SOLN
2.0000 mg | Freq: Once | INTRAMUSCULAR | Status: DC
  Filled 2013-05-15: qty 1
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
## 2013-05-15 NOTE — ED Notes (Signed)
Pt refused RN assessment states " I want my stiches now!"

## 2013-05-15 NOTE — ED Notes (Signed)
PA at bedside.

## 2013-05-15 NOTE — ED Notes (Signed)
[employer] at bedside 

## 2013-05-16 NOTE — ED Provider Notes (Signed)
Medical screening examination/treatment/procedure(s) were performed by non-physician practitioner and as supervising physician I was immediately available for consultation/collaboration.  EKG Interpretation   None        Malika Demario, MD 05/16/13 0107 

## 2013-07-06 ENCOUNTER — Telehealth (HOSPITAL_BASED_OUTPATIENT_CLINIC_OR_DEPARTMENT_OTHER): Payer: Self-pay

## 2013-07-06 ENCOUNTER — Emergency Department (HOSPITAL_COMMUNITY)
Admission: EM | Admit: 2013-07-06 | Discharge: 2013-07-06 | Discharge status: Against Medical Advice | Payer: Self-pay | Attending: Emergency Medicine | Admitting: Emergency Medicine

## 2013-07-06 ENCOUNTER — Encounter (HOSPITAL_COMMUNITY): Payer: Self-pay | Admitting: Emergency Medicine

## 2013-07-06 DIAGNOSIS — F17200 Nicotine dependence, unspecified, uncomplicated: Secondary | ICD-10-CM | POA: Insufficient documentation

## 2013-07-06 DIAGNOSIS — J45909 Unspecified asthma, uncomplicated: Secondary | ICD-10-CM | POA: Insufficient documentation

## 2013-07-06 DIAGNOSIS — Z4802 Encounter for removal of sutures: Secondary | ICD-10-CM

## 2013-07-06 DIAGNOSIS — F410 Panic disorder [episodic paroxysmal anxiety] without agoraphobia: Secondary | ICD-10-CM | POA: Insufficient documentation

## 2013-07-06 DIAGNOSIS — Z4801 Encounter for change or removal of surgical wound dressing: Secondary | ICD-10-CM | POA: Insufficient documentation

## 2013-07-06 DIAGNOSIS — N390 Urinary tract infection, site not specified: Secondary | ICD-10-CM | POA: Insufficient documentation

## 2013-07-06 DIAGNOSIS — Z3202 Encounter for pregnancy test, result negative: Secondary | ICD-10-CM | POA: Insufficient documentation

## 2013-07-06 DIAGNOSIS — N898 Other specified noninflammatory disorders of vagina: Secondary | ICD-10-CM | POA: Insufficient documentation

## 2013-07-06 LAB — URINE MICROSCOPIC-ADD ON

## 2013-07-06 LAB — URINALYSIS, ROUTINE W REFLEX MICROSCOPIC
Bilirubin Urine: NEGATIVE
GLUCOSE, UA: NEGATIVE mg/dL
KETONES UR: NEGATIVE mg/dL
NITRITE: NEGATIVE
Protein, ur: NEGATIVE mg/dL
Specific Gravity, Urine: 1.014 (ref 1.005–1.030)
UROBILINOGEN UA: 1 mg/dL (ref 0.0–1.0)
pH: 5.5 (ref 5.0–8.0)

## 2013-07-06 LAB — RPR: RPR Ser Ql: NONREACTIVE

## 2013-07-06 LAB — PREGNANCY, URINE: PREG TEST UR: NEGATIVE

## 2013-07-06 LAB — WET PREP, GENITAL
Trich, Wet Prep: NONE SEEN
Yeast Wet Prep HPF POC: NONE SEEN

## 2013-07-06 LAB — HIV ANTIBODY (ROUTINE TESTING W REFLEX): HIV: NONREACTIVE

## 2013-07-06 MED ORDER — METRONIDAZOLE 500 MG PO TABS: 500.0000 mg | ORAL_TABLET | Freq: Two times a day (BID) | ORAL | Status: DC

## 2013-07-06 MED ORDER — CIPROFLOXACIN HCL 500 MG PO TABS: 500.0000 mg | ORAL_TABLET | Freq: Two times a day (BID) | ORAL | Status: DC

## 2013-07-06 NOTE — Telephone Encounter (Signed)
Meryl DareJennifer P. NP in Ed called and requested that we call patient and inform that there are two prescriptions that we need to call in for her. Patient had to leave before discharge paperwork was completed.  I attempted to contact pt but number listed in epic is no longer available to pt.  No other number available. If pt returns call she needs prescriptions for cipro and flagyl called in

## 2013-07-06 NOTE — ED Provider Notes (Signed)
Medical screening examination/treatment/procedure(s) were performed by non-physician practitioner and as supervising physician I was immediately available for consultation/collaboration.   Dione Boozeavid Lashawn Bromwell, MD 07/06/13 0800

## 2013-07-06 NOTE — ED Notes (Addendum)
Pt. States she wants to be tested for STDs. Reports some brown vaginal discharge x4 days and lower abdominal pain that started today. Also needs stiches removed. Reports stiches have been in since Thanksgiving. Pt. Reports smoking weed this evening.

## 2013-07-06 NOTE — ED Provider Notes (Signed)
CSN: [id number]     Arrival date & time 07/06/13  [id number] History   First MD Initiated Contact with Patient 07/06/13 [phone]     Chief Complaint  Patient presents with  . Vaginal Discharge   (Consider location/radiation/quality/duration/timing/severity/associated sxs/prior Treatment) HPI Comments: Patient is a [age] year old female past medical history significant for polysubstance abuse, tobacco, asthma, panic disorders presented to the emergency department for 2 complaints. Patient's first complaint is 2 days of brown vaginal discharge with associated pelvic pain. No modifying factors. Patient states she has only had sexual intercourse with her husband, recently was discharged from incarceration yesterday. She is now requesting STD testing. The patient's second complaint is she is requesting suture removal from her face. She states the sutures were placed prior to Thanksgiving but she was unable to have them taken out in 1-2 weeks due to incarceration. She describes no problems with the sutures, except some mild pain to the area. She denies any purulent drainage or redness to the area. Denies fevers, abdominal pain, urinary symptoms, vaginal bleeding, diarrhea.   Patient is a 28 y.o. female presenting with vaginal discharge.  Vaginal Discharge Associated symptoms: no fever, no nausea and no vomiting     Past Medical History  Diagnosis Date  . Asthma   . Panic disorder   . Panic attacks    History reviewed. No pertinent past surgical history. No family history on file. History  Substance Use Topics  . Smoking status: Current Every Day Smoker -- 0.50 packs/day  . Smokeless tobacco: Not on file  . Alcohol Use: Yes     Comment: occaisional    OB History   Grav Para Term Preterm Abortions TAB SAB Ect Mult Living   3 2             Review of Systems  Constitutional: Negative for fever and chills.  Gastrointestinal: Negative for nausea, vomiting and diarrhea.  Genitourinary: Positive for  vaginal discharge and vaginal pain.  Skin: Positive for wound (healed laceration w/ sutures).  All other systems reviewed and are negative.    Allergies  Review of patient's allergies indicates no known allergies.  Home Medications   Current Outpatient Rx  Name  Route  Sig  Dispense  Refill  . ciprofloxacin (CIPRO) 500 MG tablet   Oral   Take 1 tablet (500 mg total) by mouth 2 (two) times daily.   6 tablet   0   . metroNIDAZOLE (FLAGYL) 500 MG tablet   Oral   Take 1 tablet (500 mg total) by mouth 2 (two) times daily.   14 tablet   0    BP 113/75  Pulse 108  Temp(Src) 98.1 F (36.7 C) (Oral)  Resp 18  SpO2 99%  LMP 06/29/2013 Physical Exam  Constitutional: She is oriented to person, place, and time. She appears well-developed and well-nourished. No distress.  HENT:  Head: Normocephalic and atraumatic.  Right Ear: External ear normal.  Left Ear: External ear normal.  Nose: Nose normal.  Mouth/Throat: Oropharynx is clear and moist.  Eyes: Conjunctivae are normal.  Neck: Normal range of motion. Neck supple.  Cardiovascular: Normal rate and regular rhythm.   Pulmonary/Chest: Effort normal and breath sounds normal.  Abdominal: Soft. Bowel sounds are normal. She exhibits no distension. There is no tenderness. There is no rebound and no guarding.  Musculoskeletal: Normal range of motion.  Neurological: She is alert and oriented to person, place, and time.  Skin: Skin is warm and dry. She is  not diaphoretic.  Psychiatric: She has a normal mood and affect.   Exam performed by Francee Piccolo L,  exam chaperoned Date: 07/06/2013 Pelvic exam: normal external genitalia without evidence of trauma. VULVA: normal appearing vulva with no masses, tenderness or lesion. VAGINA: normal appearing vagina with normal color and discharge, no lesions. CERVIX: normal appearing cervix without lesions, cervical motion tenderness absent, cervical os closed with out purulent discharge;  vaginal discharge - dark, foul and brown, Wet prep and DNA probe for chlamydia and GC obtained.   ADNEXA: normal adnexa in size, nontender and no masses UTERUS: uterus is normal size, shape, consistency and nontender.  .   ED Course  SUTURE REMOVAL Date/Time: 07/06/2013 6:36 AM Performed by: Jeannetta Ellis Authorized by: Jeannetta Ellis Consent: Verbal consent obtained. Body area: head/neck Location details: left cheek Wound Appearance: clean Sutures Removed: 10 Staples Removed: 0 Patient tolerance: Patient tolerated the procedure well with no immediate complications. Comments: Some skin overgrowth as sutures were placed prior to Thanksgiving.    (including critical care time) Labs Review Labs Reviewed  WET PREP, GENITAL - Abnormal; Notable for the following:    Clue Cells Wet Prep HPF POC MANY (*)    WBC, Wet Prep HPF POC FEW (*)    All other components within normal limits  URINALYSIS, ROUTINE W REFLEX MICROSCOPIC - Abnormal; Notable for the following:    APPearance CLOUDY (*)    Hgb urine dipstick TRACE (*)    Leukocytes, UA MODERATE (*)    All other components within normal limits  URINE MICROSCOPIC-ADD ON - Abnormal; Notable for the following:    Squamous Epithelial / LPF MANY (*)    Bacteria, UA MANY (*)    All other components within normal limits  GC/CHLAMYDIA PROBE AMP  URINE CULTURE  PREGNANCY, URINE  RPR  HIV ANTIBODY (ROUTINE TESTING)   Imaging Review No results found.  EKG Interpretation   None       MDM   1. Vaginal discharge   2. Visit for suture removal   3. UTI (urinary tract infection)    Afebrile, NAD, non-toxic appearing, AAOx4.   1) Vaginal Discharge: Abdomen soft, non-tender, non-distended. Pelvic exam revealed brown discharge w/o CMT or adnexal fullness. No concern for PID. Pt understands GC/Chlamydia cultures pending and that they will need to inform all sexual partners within the last 6 months if results return  positive. Pt advised that she will receive a call in 48 hours if the test is positive and to refrain from sexual activity for 48 hours. Patient will require returning to the ED for positive result as she did not stay for prophylactic treatment. Pt not concerning for PID because hemodynamically stable and no cervical motion tenderness on pelvic exam.  Counseled pt that latex condoms are the only way to prevent against STDs or HIV.  2) Suture removal: Pt to ER for staple/suture removal and wound check as above. Procedure tolerated well. Vitals normal, no signs of infection.   7:10 AM Patient left without waiting for results of all STD testing, along with urine results. Patient had this discussion with the nurse, I was unable to reconfirm what the nurse told the patient that she will likely require prophylactic treatment d/t complaints and vaginal findings but pregnancy results were pending along with wet prep. Patient told the nurse she could not wait any longer as she needed to go to work so she did not get fired.    Patient left AMA prior  to results of labs and urine. Will notify Flow Manager that patient will need to be called in Flagyl for BV and Cipro for UTI.      Jeannetta Ellis, PA-C 07/06/13 [phone]

## 2013-07-06 NOTE — ED Notes (Signed)
Pt. Left AMA. States "I need to go to work or [employer]'ll be fired. Can you just call me with the results?"  PA notified. Pt. [facility] out [facility].

## 2013-07-07 LAB — URINE CULTURE: Colony Count: >=100000

## 2013-07-08 LAB — GC/CHLAMYDIA PROBE AMP
CT Probe RNA: NEGATIVE
GC Probe RNA: NEGATIVE

## 2013-08-02 ENCOUNTER — Emergency Department (HOSPITAL_COMMUNITY)
Admission: EM | Admit: 2013-08-02 | Discharge: 2013-08-02 | Discharge status: Against Medical Advice | Payer: No Typology Code available for payment source | Attending: Emergency Medicine | Admitting: Emergency Medicine

## 2013-08-02 ENCOUNTER — Encounter (HOSPITAL_COMMUNITY): Payer: Self-pay | Admitting: Emergency Medicine

## 2013-08-02 DIAGNOSIS — F17200 Nicotine dependence, unspecified, uncomplicated: Secondary | ICD-10-CM | POA: Insufficient documentation

## 2013-08-02 DIAGNOSIS — F419 Anxiety disorder, unspecified: Secondary | ICD-10-CM

## 2013-08-02 DIAGNOSIS — F411 Generalized anxiety disorder: Secondary | ICD-10-CM | POA: Insufficient documentation

## 2013-08-02 DIAGNOSIS — J45909 Unspecified asthma, uncomplicated: Secondary | ICD-10-CM | POA: Insufficient documentation

## 2013-08-02 LAB — POCT I-STAT TROPONIN I: Troponin i, poc: 0 ng/mL (ref 0.00–0.08)

## 2013-08-02 NOTE — ED Provider Notes (Signed)
CSN: [id number]     Arrival date & time 08/02/13  1747 History   First MD Initiated Contact with Patient 08/02/13 1814     Chief Complaint  Patient presents with  . Anxiety   (Consider location/radiation/quality/duration/timing/severity/associated sxs/prior Treatment) HPI  28 year old female with history of polysubstance abuse, history of panic attacks history of asthma who presents complaining of anxiety and chest pain. Patient admits that she went out last night and did an of cocaine and drink alcohol. This morning she experiencing an anxiety attack described as heart racing, feeling very anxious which has been persistent but is currently improving. She did report having sharp chest pain earlier in the day but no current chest pain. No complaints of fever, chills, productive cough, hemoptysis, lightheadedness, dizziness, abdominal pain, or rash. States her anxiety is probably related to her recent cocaine use. She denies any SI HI.  Past Medical History  Diagnosis Date  . Asthma   . Panic disorder   . Panic attacks    History reviewed. No pertinent past surgical history. No family history on file. History  Substance Use Topics  . Smoking status: Current Every Day Smoker -- 0.50 packs/day  . Smokeless tobacco: Not on file  . Alcohol Use: Yes     Comment: occaisional    OB History   Grav Para Term Preterm Abortions TAB SAB Ect Mult Living   3 2             Review of Systems  All other systems reviewed and are negative.    Allergies  Review of patient's allergies indicates no known allergies.  Home Medications  No current outpatient prescriptions on file. BP 117/82  Pulse 94  Temp(Src) 98.6 F (37 C)  Resp 18  Wt 163 lb 9 oz (74.191 kg)  SpO2 100%  LMP 07/28/2013 Physical Exam  Nursing note and vitals reviewed. Constitutional: She is oriented to person, place, and time. She appears well-developed and well-nourished. No distress.  Awake, alert, nontoxic appearance   HENT:  Head: Atraumatic.  Eyes: Right eye exhibits no discharge. Left eye exhibits no discharge.  Neck: Neck supple.  Cardiovascular: Normal rate, regular rhythm and intact distal pulses.   Pulmonary/Chest: Effort normal. No respiratory distress. She has no wheezes. She has no rales. She exhibits no tenderness.  Abdominal: There is no tenderness. There is no rebound.  Musculoskeletal: She exhibits no edema and no tenderness.  Baseline ROM, no obvious new focal weakness  Neurological: She is alert and oriented to person, place, and time. GCS eye subscore is 4. GCS verbal subscore is 5. GCS motor subscore is 6.  Mental status and motor strength appears baseline for patient and situation  Skin: No rash noted.  Psychiatric: She has a normal mood and affect. Her speech is normal and behavior is normal. Thought content normal.    ED Course  Procedures (including critical care time)  6:31 PM Patient here with chest pain anxiety, this is a recurrent problem. Her last cocaine use was last night and a chest pain was last night and this morning as well. No active chest pain this time. She is afebrile with stable normal vital sign. Plan to obtain EKG, troponin, and a chest x-ray.  7:30 PM Patient has a normal EKG and a negative troponin. She did not receive a chest x-ray because she left AMA.  Labs Review Labs Reviewed  POCT I-STAT TROPONIN I   Imaging Review No results found.  EKG Interpretation  Date/Time:  Friday August 02 2013 17:57:59 EST Ventricular Rate:  84 PR Interval:  118 QRS Duration: 66 QT Interval:  376 QTC Calculation: 444 R Axis:   76 Text Interpretation:  Normal sinus rhythm Normal ECG Confirmed by ZAVITZ  MD, JOSHUA ([id number]) on 08/02/2013 7:20:18 PM            MDM   1. Anxiety    BP 117/82  Pulse 94  Temp(Src) 98.6 F (37 C)  Resp 18  Wt 163 lb 9 oz (74.191 kg)  SpO2 100%  LMP 07/28/2013     Fayrene HelperBowie Ira Dougher, PA-C 08/02/13 1931

## 2013-08-02 NOTE — ED Notes (Signed)
Transporters from radiology came for pt, pt not in the room at this time.

## 2013-08-02 NOTE — ED Notes (Signed)
Pt denies any CP right now. Pt reports that when she has anxiety attacks it feels like her heart races but is not experiencing those symptoms at the moment.

## 2013-08-02 NOTE — ED Notes (Signed)
Pt reports she snorted cocaine for the first time last night. Denies any issues at the time but was woken up today feeling SOB and felt like her heart was racing. Pt has h/o anxiety attacks and states she had one after she woke up with these symptoms.

## 2013-08-02 NOTE — ED Notes (Addendum)
Unable to locate patient x30 min. MD aware.

## 2013-08-02 NOTE — ED Provider Notes (Signed)
Medical screening examination/treatment/procedure(s) were performed by non-physician practitioner and as supervising physician I was immediately available for consultation/collaboration.  EKG Interpretation    Date/Time:  Friday August 02 2013 17:57:59 EST Ventricular Rate:  84 PR Interval:  118 QRS Duration: 66 QT Interval:  376 QTC Calculation: 444 R Axis:   76 Text Interpretation:  Normal sinus rhythm Normal ECG Confirmed by Jodi MourningZAVITZ  MD, Maelys Kinnick ([id number]) on 08/02/2013 7:20:18 PM              Enid SkeensJoshua M Elias Bordner, MD 08/02/13 2010

## 2013-08-02 NOTE — ED Notes (Addendum)
Pt feels highly anxious and is experiencing chest pain.  States she has been here multiple times for same.  Pt states she cannot find a doctor because she does not have insurance.  Pt snorted cocaine last night.

## 2013-08-02 NOTE — ED Notes (Signed)
Pt cannot be found in room, no belongings in room, pt told a tech that she was going to the lobby to get something from her mom 20 min ago, will continue to watch for patient.

## 2013-08-21 ENCOUNTER — Encounter (HOSPITAL_COMMUNITY): Payer: Self-pay | Admitting: Emergency Medicine

## 2013-08-21 ENCOUNTER — Emergency Department (HOSPITAL_COMMUNITY)
Admission: EM | Admit: 2013-08-21 | Discharge: 2013-08-21 | Discharge status: Against Medical Advice | Payer: No Typology Code available for payment source | Attending: Emergency Medicine | Admitting: Emergency Medicine

## 2013-08-21 DIAGNOSIS — R1110 Vomiting, unspecified: Secondary | ICD-10-CM | POA: Insufficient documentation

## 2013-08-21 DIAGNOSIS — F17200 Nicotine dependence, unspecified, uncomplicated: Secondary | ICD-10-CM | POA: Insufficient documentation

## 2013-08-21 DIAGNOSIS — J45909 Unspecified asthma, uncomplicated: Secondary | ICD-10-CM | POA: Insufficient documentation

## 2013-08-21 LAB — URINALYSIS, ROUTINE W REFLEX MICROSCOPIC
Bilirubin Urine: NEGATIVE
Glucose, UA: NEGATIVE mg/dL
HGB URINE DIPSTICK: NEGATIVE
Ketones, ur: NEGATIVE mg/dL
Leukocytes, UA: NEGATIVE
Nitrite: NEGATIVE
Protein, ur: NEGATIVE mg/dL
SPECIFIC GRAVITY, URINE: 1.012 (ref 1.005–1.030)
UROBILINOGEN UA: 0.2 mg/dL (ref 0.0–1.0)
pH: 6 (ref 5.0–8.0)

## 2013-08-21 LAB — CBC WITH DIFFERENTIAL/PLATELET
BASOS PCT: 1 % (ref 0–1)
Basophils Absolute: 0.1 10*3/uL (ref 0.0–0.1)
EOS ABS: 0.1 10*3/uL (ref 0.0–0.7)
EOS PCT: 2 % (ref 0–5)
HCT: 44.7 % (ref 36.0–46.0)
Hemoglobin: 15.4 g/dL — ABNORMAL HIGH (ref 12.0–15.0)
Lymphocytes Relative: 37 % (ref 12–46)
Lymphs Abs: 2.2 10*3/uL (ref 0.7–4.0)
MCH: 33.8 pg (ref 26.0–34.0)
MCHC: 34.5 g/dL (ref 30.0–36.0)
MCV: 98 fL (ref 78.0–100.0)
Monocytes Absolute: 0.5 10*3/uL (ref 0.1–1.0)
Monocytes Relative: 7 % (ref 3–12)
Neutro Abs: 3.2 10*3/uL (ref 1.7–7.7)
Neutrophils Relative %: 53 % (ref 43–77)
PLATELETS: 232 10*3/uL (ref 150–400)
RBC: 4.56 MIL/uL (ref 3.87–5.11)
RDW: 12.3 % (ref 11.5–15.5)
WBC: 6.1 10*3/uL (ref 4.0–10.5)

## 2013-08-21 LAB — COMPREHENSIVE METABOLIC PANEL
ALT: 10 U/L (ref 0–35)
AST: 15 U/L (ref 0–37)
Albumin: 3.7 g/dL (ref 3.5–5.2)
Alkaline Phosphatase: 77 U/L (ref 39–117)
BUN: 13 mg/dL (ref 6–23)
CO2: 22 mEq/L (ref 19–32)
Calcium: 9.3 mg/dL (ref 8.4–10.5)
Chloride: 102 mEq/L (ref 96–112)
Creatinine, Ser: 0.88 mg/dL (ref 0.50–1.10)
GFR calc Af Amer: >90 mL/min (ref >90)
GFR calc non Af Amer: >90 mL/min (ref >90)
Glucose, Bld: 129 mg/dL — ABNORMAL HIGH (ref 70–99)
Potassium: 3.9 mEq/L (ref 3.7–5.3)
SODIUM: 138 meq/L (ref 137–147)
TOTAL PROTEIN: 7.8 g/dL (ref 6.0–8.3)
Total Bilirubin: 0.6 mg/dL (ref 0.3–1.2)

## 2013-08-21 LAB — POC URINE PREG, ED: Preg Test, Ur: POSITIVE — AB

## 2013-08-21 NOTE — ED Notes (Signed)
Pt denies pain, diarrhea, or body aches

## 2013-08-21 NOTE — ED Notes (Signed)
No answer when called 

## 2013-08-21 NOTE — ED Notes (Signed)
Pt c/o N/V x 2.

## 2013-08-21 NOTE — ED Notes (Signed)
Pt called x 3 no answer 

## 2013-08-22 ENCOUNTER — Emergency Department (HOSPITAL_COMMUNITY)
Admission: EM | Admit: 2013-08-22 | Discharge: 2013-08-22 | Disposition: A | Discharge status: Home / Self Care | Payer: No Typology Code available for payment source | Attending: Emergency Medicine | Admitting: Emergency Medicine

## 2013-08-22 ENCOUNTER — Encounter (HOSPITAL_COMMUNITY): Payer: Self-pay | Admitting: Emergency Medicine

## 2013-08-22 DIAGNOSIS — F17200 Nicotine dependence, unspecified, uncomplicated: Secondary | ICD-10-CM | POA: Insufficient documentation

## 2013-08-22 DIAGNOSIS — Z8659 Personal history of other mental and behavioral disorders: Secondary | ICD-10-CM | POA: Insufficient documentation

## 2013-08-22 DIAGNOSIS — Z3490 Encounter for supervision of normal pregnancy, unspecified, unspecified trimester: Secondary | ICD-10-CM

## 2013-08-22 DIAGNOSIS — J45909 Unspecified asthma, uncomplicated: Secondary | ICD-10-CM | POA: Insufficient documentation

## 2013-08-22 DIAGNOSIS — Z3201 Encounter for pregnancy test, result positive: Secondary | ICD-10-CM | POA: Insufficient documentation

## 2013-08-22 DIAGNOSIS — R110 Nausea: Secondary | ICD-10-CM | POA: Insufficient documentation

## 2013-08-22 LAB — PREGNANCY, URINE: PREG TEST UR: POSITIVE — AB

## 2013-08-22 MED ORDER — PRENATAL VITAMINS 28-0.8 MG PO TABS
1.0000 | ORAL_TABLET | Freq: Every day | ORAL | Status: DC
Start: 2013-08-22 — End: 2014-04-07

## 2013-08-22 NOTE — Discharge Instructions (Signed)

## 2013-08-22 NOTE — ED Notes (Signed)
NP at bedside.

## 2013-08-22 NOTE — ED Provider Notes (Signed)
Medical screening examination/treatment/procedure(s) were performed by non-physician practitioner and as supervising physician I was immediately available for consultation/collaboration.     Anaeli Cornwall, MD 08/22/13 1520 

## 2013-08-22 NOTE — ED Notes (Signed)
Pt is here for pregnancy testing. States that she took two test at home. One was  + and the other -.

## 2013-08-22 NOTE — ED Provider Notes (Signed)
CSN: [id number]     Arrival date & time 08/22/13  1300 History   First MD Initiated Contact with Patient 08/22/13 1308     Chief Complaint  Patient presents with  . Possible Pregnancy     (Consider location/radiation/quality/duration/timing/severity/associated sxs/prior Treatment) Patient is a 28 y.o. female presenting with pregnancy problem. The history is provided by the patient. No language interpreter was used.  Possible Pregnancy Patient reports no abdominal pain, no vaginal bleeding and no vaginal discharge.  Associated symptoms include nausea.  Associated symptoms include no dysuria, no fever and no vomiting. Associated symptoms: She reports she is late for her menses as far as she can remember when her last cycle was and is concerned she is pregnant. She has taken tests at home that have been positive but she does not feel she can rely on them. She denies vaginal discharge or bleeding. She denies abdominal or pelvic pain. She has not had any vomiting but has experienced nausea with "certain smells"..    Past Medical History  Diagnosis Date  . Asthma   . Panic disorder   . Panic attacks    History reviewed. No pertinent past surgical history. No family history on file. History  Substance Use Topics  . Smoking status: Current Every Day Smoker -- 1.00 packs/day    Types: Cigarettes  . Smokeless tobacco: Never Used  . Alcohol Use: Yes     Comment: occaisional    OB History   Grav Para Term Preterm Abortions TAB SAB Ect Mult Living   3 2             Review of Systems  Constitutional: Negative for fever and chills.  Gastrointestinal: Positive for nausea. Negative for vomiting and abdominal pain.  Genitourinary: Negative for dysuria, vaginal bleeding, vaginal discharge and pelvic pain.  Musculoskeletal: Negative.   Skin: Negative.   Neurological: Negative.       Allergies  Review of patient's allergies indicates no known allergies.  Home Medications  No current  outpatient prescriptions on file. BP 111/74  Pulse 85  Temp(Src) 98.3 F (36.8 C) (Oral)  Resp 16  Ht 5\' 2"  (1.575 m)  Wt 163 lb (73.936 kg)  BMI 29.81 kg/m2  SpO2 99%  LMP 06/27/2013 Physical Exam  Constitutional: She is oriented to person, place, and time. She appears well-developed and well-nourished.  Neck: Normal range of motion.  Pulmonary/Chest: Effort normal.  Abdominal: There is no tenderness.  Genitourinary:  Pelvic exam deferred as patient is asymptomatic for discharge or abnormal bleeding.  Neurological: She is alert and oriented to person, place, and time.  Skin: Skin is warm and dry.    ED Course  Procedures (including critical care time) Labs Review Labs Reviewed  PREGNANCY, URINE  POC URINE PREG, ED   Imaging Review No results found.  EKG Interpretation   None       MDM   Final diagnoses:  None    1. Pregnant  She is not having any symptoms concerning for miscarriage or pelvic infection. She reports she simply wanted a pregnancy test which was done confirming her home tests. She is stable for discharge.     Arnoldo HookerShari A Charleigh Correnti, PA-C 08/22/13 1405

## 2013-10-12 DIAGNOSIS — Z3202 Encounter for pregnancy test, result negative: Secondary | ICD-10-CM | POA: Insufficient documentation

## 2013-10-12 DIAGNOSIS — F17200 Nicotine dependence, unspecified, uncomplicated: Secondary | ICD-10-CM | POA: Insufficient documentation

## 2013-10-12 DIAGNOSIS — R109 Unspecified abdominal pain: Secondary | ICD-10-CM | POA: Insufficient documentation

## 2013-10-12 DIAGNOSIS — J45909 Unspecified asthma, uncomplicated: Secondary | ICD-10-CM | POA: Insufficient documentation

## 2013-10-13 ENCOUNTER — Encounter (HOSPITAL_COMMUNITY): Payer: Self-pay | Admitting: Emergency Medicine

## 2013-10-13 ENCOUNTER — Emergency Department (HOSPITAL_COMMUNITY)
Admission: EM | Admit: 2013-10-13 | Discharge: 2013-10-13 | Discharge status: Against Medical Advice | Payer: No Typology Code available for payment source | Attending: Emergency Medicine | Admitting: Emergency Medicine

## 2013-10-13 LAB — COMPREHENSIVE METABOLIC PANEL
ALT: 11 U/L (ref 0–35)
AST: 22 U/L (ref 0–37)
Albumin: 3.7 g/dL (ref 3.5–5.2)
Alkaline Phosphatase: 69 U/L (ref 39–117)
BILIRUBIN TOTAL: 0.4 mg/dL (ref 0.3–1.2)
BUN: 16 mg/dL (ref 6–23)
CHLORIDE: 97 meq/L (ref 96–112)
CO2: 25 mEq/L (ref 19–32)
CREATININE: 0.89 mg/dL (ref 0.50–1.10)
Calcium: 9.5 mg/dL (ref 8.4–10.5)
GFR calc Af Amer: >90 mL/min (ref >90)
GFR calc non Af Amer: 89 mL/min — ABNORMAL LOW (ref >90)
GLUCOSE: 111 mg/dL — AB (ref 70–99)
Potassium: 3.7 mEq/L (ref 3.7–5.3)
Sodium: 135 mEq/L — ABNORMAL LOW (ref 137–147)
Total Protein: 7.7 g/dL (ref 6.0–8.3)

## 2013-10-13 LAB — CBC WITH DIFFERENTIAL/PLATELET
Basophils Absolute: 0.1 10*3/uL (ref 0.0–0.1)
Basophils Relative: 1 % (ref 0–1)
Eosinophils Absolute: 0.1 10*3/uL (ref 0.0–0.7)
Eosinophils Relative: 1 % (ref 0–5)
HCT: 42.6 % (ref 36.0–46.0)
Hemoglobin: 14.3 g/dL (ref 12.0–15.0)
LYMPHS PCT: 35 % (ref 12–46)
Lymphs Abs: 2.9 10*3/uL (ref 0.7–4.0)
MCH: 33.4 pg (ref 26.0–34.0)
MCHC: 33.6 g/dL (ref 30.0–36.0)
MCV: 99.5 fL (ref 78.0–100.0)
MONO ABS: 0.6 10*3/uL (ref 0.1–1.0)
MONOS PCT: 7 % (ref 3–12)
NEUTROS ABS: 4.6 10*3/uL (ref 1.7–7.7)
Neutrophils Relative %: 56 % (ref 43–77)
Platelets: 267 10*3/uL (ref 150–400)
RBC: 4.28 MIL/uL (ref 3.87–5.11)
RDW: 12.1 % (ref 11.5–15.5)
WBC: 8.3 10*3/uL (ref 4.0–10.5)

## 2013-10-13 LAB — PREGNANCY, URINE: Preg Test, Ur: NEGATIVE

## 2013-10-13 LAB — URINE MICROSCOPIC-ADD ON

## 2013-10-13 LAB — URINALYSIS, ROUTINE W REFLEX MICROSCOPIC
Glucose, UA: NEGATIVE mg/dL
Hgb urine dipstick: NEGATIVE
KETONES UR: NEGATIVE mg/dL
NITRITE: NEGATIVE
PROTEIN: NEGATIVE mg/dL
Specific Gravity, Urine: 1.034 — ABNORMAL HIGH (ref 1.005–1.030)
UROBILINOGEN UA: 1 mg/dL (ref 0.0–1.0)
pH: 6 (ref 5.0–8.0)

## 2013-10-13 NOTE — ED Notes (Signed)
The pt is c/o lt abd pain  Today no n v lmp last month  She wants to be checked for a disease

## 2013-10-13 NOTE — ED Notes (Signed)
Left before being seen.

## 2014-04-07 ENCOUNTER — Emergency Department (HOSPITAL_COMMUNITY)
Admission: EM | Admit: 2014-04-07 | Discharge: 2014-04-07 | Disposition: A | Discharge status: Home / Self Care | Payer: No Typology Code available for payment source | Attending: Emergency Medicine | Admitting: Emergency Medicine

## 2014-04-07 ENCOUNTER — Encounter (HOSPITAL_COMMUNITY): Payer: Self-pay | Admitting: Emergency Medicine

## 2014-04-07 DIAGNOSIS — J45909 Unspecified asthma, uncomplicated: Secondary | ICD-10-CM | POA: Insufficient documentation

## 2014-04-07 DIAGNOSIS — Z8659 Personal history of other mental and behavioral disorders: Secondary | ICD-10-CM | POA: Insufficient documentation

## 2014-04-07 DIAGNOSIS — J020 Streptococcal pharyngitis: Secondary | ICD-10-CM | POA: Insufficient documentation

## 2014-04-07 DIAGNOSIS — Z720 Tobacco use: Secondary | ICD-10-CM | POA: Insufficient documentation

## 2014-04-07 LAB — RAPID STREP SCREEN (MED CTR MEBANE ONLY): STREPTOCOCCUS, GROUP A SCREEN (DIRECT): POSITIVE — AB

## 2014-04-07 MED ORDER — LIDOCAINE VISCOUS 2 % MT SOLN: 20.0000 mL | OROMUCOSAL | Status: DC | PRN

## 2014-04-07 MED ORDER — KETOROLAC TROMETHAMINE 30 MG/ML IJ SOLN
30.0000 mg | Freq: Once | INTRAMUSCULAR | Status: AC
  Administered 2014-04-07: 30 mg via INTRAMUSCULAR
  Filled 2014-04-07: qty 1

## 2014-04-07 MED ORDER — IBUPROFEN 800 MG PO TABS: 800.0000 mg | ORAL_TABLET | Freq: Three times a day (TID) | ORAL | Status: DC

## 2014-04-07 MED ORDER — LIDOCAINE VISCOUS 2 % MT SOLN
15.0000 mL | Freq: Once | OROMUCOSAL | Status: AC
  Administered 2014-04-07: 15 mL via OROMUCOSAL
  Filled 2014-04-07: qty 15

## 2014-04-07 MED ORDER — AMOXICILLIN 500 MG PO CAPS: 500.0000 mg | ORAL_CAPSULE | Freq: Three times a day (TID) | ORAL | Status: DC

## 2014-04-07 NOTE — ED Provider Notes (Signed)
CSN: [id number]     Arrival date & time 04/07/14  1846 History  This chart was scribed for non-physician practitioner, Terri Piedraourtney Forcucci, PA-C working with Rolland PorterMark James, MD by Greggory StallionKayla Andersen, ED scribe. This patient was seen in room TR09C/TR09C and the patient's care was started at 7:47 PM.   Chief Complaint  Patient presents with  . Sore Throat   The history is provided by the patient. No language interpreter was used.   HPI Comments: Monica RumpsBrittany L Holloway is a 28 y.o. female who presents to the Emergency Department complaining of gradual onset sore throat that started 3 days ago. Reports associated subjective fever and chills. Pt has taken tylenol and NyQuil with no relief. Denies abdominal pain, nausea, emesis. Denies sick contacts. Pt does not have a PCP.  Past Medical History  Diagnosis Date  . Asthma   . Panic disorder   . Panic attacks    History reviewed. No pertinent past surgical history. History reviewed. No pertinent family history. History  Substance Use Topics  . Smoking status: Current Every Day Smoker -- 1.00 packs/day    Types: Cigarettes  . Smokeless tobacco: Never Used  . Alcohol Use: Yes     Comment: occaisional    OB History   Grav Para Term Preterm Abortions TAB SAB Ect Mult Living   3 2             Review of Systems  Constitutional: Positive for fever and chills.  HENT: Positive for sore throat.   Gastrointestinal: Negative for nausea, vomiting and abdominal pain.  All other systems reviewed and are negative.  Allergies  Review of patient's allergies indicates no known allergies.  Home Medications   Prior to Admission medications   Medication Sig Start Date End Date Taking? Authorizing Provider  amoxicillin (AMOXIL) 500 MG capsule Take 1 capsule (500 mg total) by mouth 3 (three) times daily. 04/07/14   Kimra Kantor A Forcucci, PA-C  ibuprofen (ADVIL,MOTRIN) 800 MG tablet Take 1 tablet (800 mg total) by mouth 3 (three) times daily. 04/07/14   Ryelle Ruvalcaba A  Forcucci, PA-C  lidocaine (XYLOCAINE) 2 % solution Use as directed 20 mLs in the mouth or throat as needed for mouth pain. 04/07/14   Davonda Ausley A Forcucci, PA-C   BP 137/104  Pulse 105  Temp(Src) 99.9 F (37.7 C) (Oral)  Resp 18  SpO2 100%  Physical Exam  Nursing note and vitals reviewed. Constitutional: She is oriented to person, place, and time. She appears well-developed and well-nourished. No distress.  HENT:  Head: Normocephalic and atraumatic.  Nose: Mucosal edema present.  Mouth/Throat: Uvula is midline and mucous membranes are normal. No trismus in the jaw. No uvula swelling. Posterior oropharyngeal edema and posterior oropharyngeal erythema present. No oropharyngeal exudate.  Eyes: Conjunctivae and EOM are normal. Pupils are equal, round, and reactive to light.  Neck: Normal range of motion. Neck supple. No tracheal deviation present. No thyromegaly present.  Cardiovascular: Normal rate, regular rhythm, normal heart sounds and intact distal pulses.  Exam reveals no gallop and no friction rub.   No murmur heard. Pulmonary/Chest: Effort normal and breath sounds normal. No respiratory distress. She has no wheezes. She has no rhonchi. She has no rales. She exhibits no tenderness.  Abdominal: Soft. Bowel sounds are normal.  Musculoskeletal: Normal range of motion.  Lymphadenopathy:    She has cervical adenopathy.  Neurological: She is alert and oriented to person, place, and time.  Skin: Skin is warm and dry.  Psychiatric: She has  a normal mood and affect. Her behavior is normal. Judgment and thought content normal.    ED Course  Procedures (including critical care time)  DIAGNOSTIC STUDIES: Oxygen Saturation is 100% on RA, normal by my interpretation.    COORDINATION OF CARE: 7:51 PM-Advised pt of strep results. Discussed treatment plan which includes toradol in the ED and amoxicillin with pt at bedside and pt agreed to plan.   Labs Review Labs Reviewed  RAPID STREP  SCREEN - Abnormal; Notable for the following:    Streptococcus, Group A Screen (Direct) POSITIVE (*)    All other components within normal limits    Imaging Review No results found.   EKG Interpretation None      MDM   Final diagnoses:  Strep throat   Patient is a 28 y.o. Female who presents to the ED with sore throat x 3 days.  Physical exam reveals edematous tonsils with no evidence of trismus or peritonsillar abscess.  Patient is strep positive.  Will discharge the patient home with viscous lidocaine, amoxicillin, ibuprofen.  Patient given IM toradol here.  Patient is stable for discharge.  She is to return for peritonsillar abscess symptoms.  Patient is in agreement.    I personally performed the services described in this documentation, which was scribed in my presence. The recorded information has been reviewed and is accurate.  Eben Burowourtney A Forcucci, PA-C 04/07/14 1956

## 2014-04-07 NOTE — ED Notes (Signed)
Pt c/o sore throat and fever; pt sts "she can taste blood"; pt will not let me look at her throat

## 2014-04-07 NOTE — Discharge Instructions (Signed)

## 2014-04-13 NOTE — ED Provider Notes (Signed)
Medical screening examination/treatment/procedure(s) were performed by non-physician practitioner and as supervising physician I was immediately available for consultation/collaboration.   EKG Interpretation None        Monica PorterMark Elisheva Fallas, MD 04/13/14 [phone]

## 2014-04-27 ENCOUNTER — Encounter (HOSPITAL_COMMUNITY): Payer: Self-pay | Admitting: Emergency Medicine

## 2014-04-27 ENCOUNTER — Emergency Department (HOSPITAL_COMMUNITY): Payer: No Typology Code available for payment source

## 2014-04-27 ENCOUNTER — Emergency Department (HOSPITAL_COMMUNITY)
Admission: EM | Admit: 2014-04-27 | Discharge: 2014-04-27 | Disposition: A | Discharge status: Home / Self Care | Payer: No Typology Code available for payment source | Attending: Emergency Medicine | Admitting: Emergency Medicine

## 2014-04-27 DIAGNOSIS — F1410 Cocaine abuse, uncomplicated: Secondary | ICD-10-CM | POA: Insufficient documentation

## 2014-04-27 DIAGNOSIS — R1110 Vomiting, unspecified: Secondary | ICD-10-CM

## 2014-04-27 DIAGNOSIS — Z792 Long term (current) use of antibiotics: Secondary | ICD-10-CM | POA: Insufficient documentation

## 2014-04-27 DIAGNOSIS — F10920 Alcohol use, unspecified with intoxication, uncomplicated: Secondary | ICD-10-CM

## 2014-04-27 DIAGNOSIS — R079 Chest pain, unspecified: Secondary | ICD-10-CM | POA: Insufficient documentation

## 2014-04-27 DIAGNOSIS — F1210 Cannabis abuse, uncomplicated: Secondary | ICD-10-CM | POA: Insufficient documentation

## 2014-04-27 DIAGNOSIS — J45909 Unspecified asthma, uncomplicated: Secondary | ICD-10-CM | POA: Insufficient documentation

## 2014-04-27 DIAGNOSIS — F10120 Alcohol abuse with intoxication, uncomplicated: Secondary | ICD-10-CM | POA: Insufficient documentation

## 2014-04-27 DIAGNOSIS — Z720 Tobacco use: Secondary | ICD-10-CM | POA: Insufficient documentation

## 2014-04-27 DIAGNOSIS — Z3202 Encounter for pregnancy test, result negative: Secondary | ICD-10-CM | POA: Insufficient documentation

## 2014-04-27 DIAGNOSIS — R112 Nausea with vomiting, unspecified: Secondary | ICD-10-CM | POA: Insufficient documentation

## 2014-04-27 DIAGNOSIS — R202 Paresthesia of skin: Secondary | ICD-10-CM | POA: Insufficient documentation

## 2014-04-27 DIAGNOSIS — Z8659 Personal history of other mental and behavioral disorders: Secondary | ICD-10-CM | POA: Insufficient documentation

## 2014-04-27 DIAGNOSIS — Z791 Long term (current) use of non-steroidal anti-inflammatories (NSAID): Secondary | ICD-10-CM | POA: Insufficient documentation

## 2014-04-27 LAB — CBC WITH DIFFERENTIAL/PLATELET
BASOS PCT: 1 % (ref 0–1)
Basophils Absolute: 0.1 10*3/uL (ref 0.0–0.1)
EOS PCT: 1 % (ref 0–5)
Eosinophils Absolute: 0 10*3/uL (ref 0.0–0.7)
HEMATOCRIT: 41.3 % (ref 36.0–46.0)
HEMOGLOBIN: 14.2 g/dL (ref 12.0–15.0)
Lymphocytes Relative: 31 % (ref 12–46)
Lymphs Abs: 2 10*3/uL (ref 0.7–4.0)
MCH: 33 pg (ref 26.0–34.0)
MCHC: 34.4 g/dL (ref 30.0–36.0)
MCV: 96 fL (ref 78.0–100.0)
MONOS PCT: 4 % (ref 3–12)
Monocytes Absolute: 0.3 10*3/uL (ref 0.1–1.0)
NEUTROS ABS: 4.1 10*3/uL (ref 1.7–7.7)
Neutrophils Relative %: 63 % (ref 43–77)
Platelets: 303 10*3/uL (ref 150–400)
RBC: 4.3 MIL/uL (ref 3.87–5.11)
RDW: 12.5 % (ref 11.5–15.5)
WBC: 6.5 10*3/uL (ref 4.0–10.5)

## 2014-04-27 LAB — COMPREHENSIVE METABOLIC PANEL
ALT: 14 U/L (ref 0–35)
ANION GAP: 16 — AB (ref 5–15)
AST: 23 U/L (ref 0–37)
Albumin: 4 g/dL (ref 3.5–5.2)
Alkaline Phosphatase: 62 U/L (ref 39–117)
BUN: 7 mg/dL (ref 6–23)
CHLORIDE: 101 meq/L (ref 96–112)
CO2: 20 meq/L (ref 19–32)
CREATININE: 0.6 mg/dL (ref 0.50–1.10)
Calcium: 9.1 mg/dL (ref 8.4–10.5)
GFR calc Af Amer: >90 mL/min (ref >90)
Glucose, Bld: 110 mg/dL — ABNORMAL HIGH (ref 70–99)
Potassium: 4.4 mEq/L (ref 3.7–5.3)
Sodium: 137 mEq/L (ref 137–147)
Total Bilirubin: 0.3 mg/dL (ref 0.3–1.2)
Total Protein: 8.6 g/dL — ABNORMAL HIGH (ref 6.0–8.3)

## 2014-04-27 LAB — POC URINE PREG, ED: PREG TEST UR: NEGATIVE

## 2014-04-27 LAB — RAPID URINE DRUG SCREEN, HOSP PERFORMED
AMPHETAMINES: NOT DETECTED
Barbiturates: NOT DETECTED
Benzodiazepines: NOT DETECTED
Cocaine: POSITIVE — AB
OPIATES: NOT DETECTED
TETRAHYDROCANNABINOL: POSITIVE — AB

## 2014-04-27 LAB — ETHANOL: Alcohol, Ethyl (B): 217 mg/dL — ABNORMAL HIGH (ref 0–11)

## 2014-04-27 MED ORDER — ONDANSETRON 4 MG PO TBDP
ORAL_TABLET | ORAL | Status: DC
Start: 2014-04-27 — End: 2014-10-15

## 2014-04-27 MED ORDER — ONDANSETRON HCL 4 MG/2ML IJ SOLN
4.0000 mg | Freq: Once | INTRAMUSCULAR | Status: AC
  Administered 2014-04-27: 4 mg via INTRAVENOUS
  Filled 2014-04-27: qty 2

## 2014-04-27 MED ORDER — SODIUM CHLORIDE 0.9 % IV BOLUS (SEPSIS)
1000.0000 mL | Freq: Once | INTRAVENOUS | Status: AC
  Administered 2014-04-27: 1000 mL via INTRAVENOUS

## 2014-04-27 MED ORDER — ACETAMINOPHEN 500 MG PO TABS
1000.0000 mg | ORAL_TABLET | Freq: Once | ORAL | Status: AC
  Administered 2014-04-27: 1000 mg via ORAL
  Filled 2014-04-27: qty 2

## 2014-04-27 NOTE — ED Notes (Signed)
Patient c/o "my heart hurts when I talk". EKG performed at bedside and given to Dr. Gwenlyn FudgeGoldstein.

## 2014-04-27 NOTE — ED Provider Notes (Signed)
CSN: [id number]     Arrival date & time 04/27/14  0436 History   First MD Initiated Contact with Patient 04/27/14 [id number]     Chief Complaint  Patient presents with  . Numbness  . Hematemesis     (Consider location/radiation/quality/duration/timing/severity/associated sxs/prior Treatment) HPI  28 year old female presents with 2 hours of vomiting, chest pain, and tingling in all of her body. She states she drank "2 beers" tonight. She was trying to go to bed and she vomited twice and states there was dark blood in it. Denies abdominal pain. States that her "heart is hurting". She is repeating this over and over despite me asking different questions. The patient states that her whole body feels numb and tingly. She was able to walk for EMS. Denies shortness of breath. No cough. She denies any drug use. No headaches.  Past Medical History  Diagnosis Date  . Asthma   . Panic disorder   . Panic attacks    History reviewed. No pertinent past surgical history. No family history on file. History  Substance Use Topics  . Smoking status: Current Every Day Smoker -- 1.00 packs/day    Types: Cigarettes  . Smokeless tobacco: Never Used  . Alcohol Use: Yes     Comment: occaisional    OB History    Gravida Para Term Preterm AB TAB SAB Ectopic Multiple Living   3 2             Review of Systems  Respiratory: Negative for shortness of breath.   Cardiovascular: Positive for chest pain.  Gastrointestinal: Positive for nausea and vomiting. Negative for abdominal pain and blood in stool.  Neurological: Positive for numbness. Negative for weakness.  All other systems reviewed and are negative.     Allergies  Review of patient's allergies indicates no known allergies.  Home Medications   Prior to Admission medications   Medication Sig Start Date End Date Taking? Authorizing Provider  amoxicillin (AMOXIL) 500 MG capsule Take 1 capsule (500 mg total) by mouth 3 (three) times daily. 04/07/14    Courtney A Forcucci, PA-C  ibuprofen (ADVIL,MOTRIN) 800 MG tablet Take 1 tablet (800 mg total) by mouth 3 (three) times daily. 04/07/14   Courtney A Forcucci, PA-C  lidocaine (XYLOCAINE) 2 % solution Use as directed 20 mLs in the mouth or throat as needed for mouth pain. 04/07/14   Courtney A Forcucci, PA-C   BP 134/87 mmHg  Pulse 78  Temp(Src) 98.4 F (36.9 C) (Axillary)  Resp 18  SpO2 99% Physical Exam  Constitutional: She is oriented to person, place, and time. She appears well-developed and well-nourished. No distress.  Speaking extremely quitely  HENT:  Head: Normocephalic and atraumatic.  Right Ear: External ear normal.  Left Ear: External ear normal.  Nose: Nose normal.  Eyes: EOM are normal. Pupils are equal, round, and reactive to light. Right eye exhibits no discharge. Left eye exhibits no discharge.  Cardiovascular: Normal rate, regular rhythm and normal heart sounds.   Pulmonary/Chest: Effort normal and breath sounds normal. She exhibits no tenderness.  No crepitus  Abdominal: Soft. She exhibits no distension. There is no tenderness.  Genitourinary: Rectal exam shows anal tone normal. Guaiac negative stool (normal brown stool).  Neurological: She is alert and oriented to person, place, and time.  CN 2-12 grossly intact. Normal and equal strength in all 4 extremities. Normal gross sensation and normal light and pinprick sensation.  Skin: Skin is warm and dry.  Nursing note and vitals  reviewed.   ED Course  Procedures (including critical care time) Labs Review Labs Reviewed  COMPREHENSIVE METABOLIC PANEL - Abnormal; Notable for the following:    Glucose, Bld 110 (*)    Total Protein 8.6 (*)    Anion gap 16 (*)    All other components within normal limits  URINE RAPID DRUG SCREEN (HOSP PERFORMED) - Abnormal; Notable for the following:    Cocaine POSITIVE (*)    Tetrahydrocannabinol POSITIVE (*)    All other components within normal limits  ETHANOL - Abnormal;  Notable for the following:    Alcohol, Ethyl (B) 217 (*)    All other components within normal limits  CBC WITH DIFFERENTIAL  POC URINE PREG, ED  POC OCCULT BLOOD, ED    Imaging Review Dg Chest 2 View  04/27/2014   CLINICAL DATA:  Initial evaluation for vomiting. Headache. Extremity numbness.  EXAM: CHEST  2 VIEW  COMPARISON:  Prior study from 12/28/2012  FINDINGS: The cardiac and mediastinal silhouettes are stable in size and contour, and remain within normal limits.  The lungs are normally inflated. No airspace consolidation, pleural effusion, or pulmonary edema is identified. There is no pneumothorax.  No acute osseous abnormality identified.  IMPRESSION: No active cardiopulmonary disease.   Electronically Signed   By: Rise MuBenjamin  McClintock M.D.   On: 04/27/2014 06:57     EKG Interpretation None      Date: 04/27/2014  Rate: 82  Rhythm: normal sinus rhythm  QRS Axis: normal  Intervals: normal  ST/T Wave abnormalities: normal  Conduction Disutrbances:none  Narrative Interpretation:   Old EKG Reviewed: unchanged   MDM   Final diagnoses:  Vomiting  Alcohol intoxication, uncomplicated  Tingling in extremities    Patient appears intoxicated, speaks very quietly, though this quickly resolved. Neuro exam is completely normal. There is no evidence of vomiting here, and VS are normal. Patient sobered in ED. I feel her vomiting was likely from ETOH use, and I have low suspicion for upper GI bleed besides mallory weiss, esophageal perforation, or acute abdominal process. Will d/c with zofran, counseled on ETOH use.     Audree CamelScott T Aurthur Wingerter, MD 04/27/14 2049

## 2014-04-27 NOTE — ED Notes (Signed)
Bed: WA18 Expected date:  Expected time:  Means of arrival:  Comments: EMS 

## 2014-04-27 NOTE — ED Notes (Signed)
Patient presents from home for bilateral lower extremity numbness and blood in vomit. [employer] reports pt was ambulatory upon scene. EMS reports bilateral pedal pulses intact., plantar reflexes intact in bilateral feet.   120/65BP, 95HR, 14Resp, 98% 02

## 2014-04-27 NOTE — Discharge Instructions (Signed)
Alcohol Intoxication Alcohol intoxication occurs when the amount of alcohol that a person has consumed impairs his or her ability to mentally and physically function. Alcohol directly impairs the normal chemical activity of the brain. Drinking large amounts of alcohol can lead to changes in mental function and behavior, and it can cause many physical effects that can be harmful.  Alcohol intoxication can range in severity from mild to very severe. Various factors can affect the level of intoxication that occurs, such as the person's age, gender, weight, frequency of alcohol consumption, and the presence of other medical conditions (such as diabetes, seizures, or heart conditions). Dangerous levels of alcohol intoxication may occur when people drink large amounts of alcohol in a short period (binge drinking). Alcohol can also be especially dangerous when combined with certain prescription medicines or "recreational" drugs. SIGNS AND SYMPTOMS Some common signs and symptoms of mild alcohol intoxication include:  Loss of coordination.  Changes in mood and behavior.  Impaired judgment.  Slurred speech. As alcohol intoxication progresses to more severe levels, other signs and symptoms will appear. These may include:  Vomiting.  Confusion and impaired memory.  Slowed breathing.  Seizures.  Loss of consciousness. DIAGNOSIS  Your health care provider will take a medical history and perform a physical exam. You will be asked about the amount and type of alcohol you have consumed. Blood tests will be done to measure the concentration of alcohol in your blood. In many places, your blood alcohol level must be lower than 80 mg/dL (1.61%0.08%) to legally drive. However, many dangerous effects of alcohol can occur at much lower levels.  TREATMENT  People with alcohol intoxication often do not require treatment. Most of the effects of alcohol intoxication are temporary, and they go away as the alcohol naturally  leaves the body. Your health care provider will monitor your condition until you are stable enough to go home. Fluids are sometimes given through an IV access tube to help prevent dehydration.  HOME CARE INSTRUCTIONS  Do not drive after drinking alcohol.  Stay hydrated. Drink enough water and fluids to keep your urine clear or pale yellow. Avoid caffeine.   Only take over-the-counter or prescription medicines as directed by your health care provider.  SEEK MEDICAL CARE IF:   You have persistent vomiting.   You do not feel better after a few days.  You have frequent alcohol intoxication. Your health care provider can help determine if you should see a substance use treatment counselor. SEEK IMMEDIATE MEDICAL CARE IF:   You become shaky or tremble when you try to stop drinking.   You shake uncontrollably (seizure).   You throw up (vomit) blood. This may be bright red or may look like black coffee grounds.   You have blood in your stool. This may be bright red or may appear as a black, tarry, bad smelling stool.   You become lightheaded or faint.  MAKE SURE YOU:   Understand these instructions.  Will watch your condition.  Will get help right away if you are not doing well or get worse. Document Released: 03/23/2005 Document Revised: 02/13/2013 Document Reviewed: 11/16/2012 [facility] Patient Information 2015 [place], [place]. This information is not intended to replace advice given to you by your health care provider. Make sure you discuss any questions you have with your health care provider.    Nausea and Vomiting Nausea is a sick feeling that often comes before throwing up (vomiting). Vomiting is a reflex where stomach contents come out  of your mouth. Vomiting can cause severe loss of body fluids (dehydration). Children and elderly adults can become dehydrated quickly, especially if they also have diarrhea. Nausea and vomiting are symptoms of a condition or disease. It is  important to find the cause of your symptoms. CAUSES   Direct irritation of the stomach lining. This irritation can result from increased acid production (gastroesophageal reflux disease), infection, food poisoning, taking certain medicines (such as nonsteroidal anti-inflammatory drugs), alcohol use, or tobacco use.  Signals from the brain.These signals could be caused by a headache, heat exposure, an inner ear disturbance, increased pressure in the brain from injury, infection, a tumor, or a concussion, pain, emotional stimulus, or metabolic problems.  An obstruction in the gastrointestinal tract (bowel obstruction).  Illnesses such as diabetes, hepatitis, gallbladder problems, appendicitis, kidney problems, cancer, sepsis, atypical symptoms of a heart attack, or eating disorders.  Medical treatments such as chemotherapy and radiation.  Receiving medicine that makes you sleep (general anesthetic) during surgery. DIAGNOSIS Your caregiver may ask for tests to be done if the problems do not improve after a few days. Tests may also be done if symptoms are severe or if the reason for the nausea and vomiting is not clear. Tests may include:  Urine tests.  Blood tests.  Stool tests.  Cultures (to look for evidence of infection).  X-rays or other imaging studies. Test results can help your caregiver make decisions about treatment or the need for additional tests. TREATMENT You need to stay well hydrated. Drink frequently but in small amounts.You may wish to drink water, sports drinks, clear broth, or eat frozen ice pops or gelatin dessert to help stay hydrated.When you eat, eating slowly may help prevent nausea.There are also some antinausea medicines that may help prevent nausea. HOME CARE INSTRUCTIONS   Take all medicine as directed by your caregiver.  If you do not have an appetite, do not force yourself to eat. However, you must continue to drink fluids.  If you have an appetite,  eat a normal diet unless your caregiver tells you differently.  Eat a variety of complex carbohydrates (rice, wheat, potatoes, bread), lean meats, yogurt, fruits, and vegetables.  Avoid high-fat foods because they are more difficult to digest.  Drink enough water and fluids to keep your urine clear or pale yellow.  If you are dehydrated, ask your caregiver for specific rehydration instructions. Signs of dehydration may include:  Severe thirst.  Dry lips and mouth.  Dizziness.  Dark urine.  Decreasing urine frequency and amount.  Confusion.  Rapid breathing or pulse. SEEK IMMEDIATE MEDICAL CARE IF:   You have blood or brown flecks (like coffee grounds) in your vomit.  You have black or bloody stools.  You have a severe headache or stiff neck.  You are confused.  You have severe abdominal pain.  You have chest pain or trouble breathing.  You do not urinate at least once every 8 hours.  You develop cold or clammy skin.  You continue to vomit for longer than 24 to 48 hours.  You have a fever. MAKE SURE YOU:   Understand these instructions.  Will watch your condition.  Will get help right away if you are not doing well or get worse. Document Released: 06/13/2005 Document Revised: 09/05/2011 Document Reviewed: 11/10/2010 [facility] Patient Information 2015 [place], [place]. This information is not intended to replace advice given to you by your health care provider. Make sure you discuss any questions you have with your health care  provider.
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
## 2014-04-28 ENCOUNTER — Encounter (HOSPITAL_COMMUNITY): Payer: Self-pay | Admitting: Emergency Medicine

## 2014-04-28 LAB — POC OCCULT BLOOD, ED: FECAL OCCULT BLD: NEGATIVE

## 2014-05-03 ENCOUNTER — Emergency Department (HOSPITAL_COMMUNITY)
Admission: EM | Admit: 2014-05-03 | Discharge: 2014-05-03 | Discharge status: Against Medical Advice | Payer: No Typology Code available for payment source | Attending: Emergency Medicine | Admitting: Emergency Medicine

## 2014-05-03 ENCOUNTER — Encounter (HOSPITAL_COMMUNITY): Payer: Self-pay

## 2014-05-03 DIAGNOSIS — Y998 Other external cause status: Secondary | ICD-10-CM | POA: Insufficient documentation

## 2014-05-03 DIAGNOSIS — S40812A Abrasion of left upper arm, initial encounter: Secondary | ICD-10-CM | POA: Insufficient documentation

## 2014-05-03 DIAGNOSIS — Y92099 Unspecified place in other non-institutional residence as the place of occurrence of the external cause: Secondary | ICD-10-CM | POA: Insufficient documentation

## 2014-05-03 DIAGNOSIS — J45909 Unspecified asthma, uncomplicated: Secondary | ICD-10-CM | POA: Insufficient documentation

## 2014-05-03 DIAGNOSIS — S40811A Abrasion of right upper arm, initial encounter: Secondary | ICD-10-CM | POA: Insufficient documentation

## 2014-05-03 DIAGNOSIS — Z720 Tobacco use: Secondary | ICD-10-CM | POA: Insufficient documentation

## 2014-05-03 DIAGNOSIS — Y9389 Activity, other specified: Secondary | ICD-10-CM | POA: Insufficient documentation

## 2014-05-03 NOTE — ED Notes (Signed)
On assessment Pt did not have open bleeding wounds on Bil. Arms Pt did not have any bleeding.
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
## 2014-05-03 NOTE — ED Notes (Signed)
Attempted to assess Pt but Pt stated " I don't want to tell anyone my business." Pt making a telephone call while talking to Nurse. Pt stated" If don't get things taken care or I am just going to walk ou."

## 2014-05-03 NOTE — ED Notes (Signed)
Pt is absent from room.

## 2014-05-13 ENCOUNTER — Emergency Department (HOSPITAL_COMMUNITY)
Admission: EM | Admit: 2014-05-13 | Discharge: 2014-05-13 | Disposition: A | Discharge status: Home / Self Care | Payer: No Typology Code available for payment source | Attending: Emergency Medicine | Admitting: Emergency Medicine

## 2014-05-13 ENCOUNTER — Encounter (HOSPITAL_COMMUNITY): Payer: Self-pay | Admitting: Emergency Medicine

## 2014-05-13 DIAGNOSIS — Z720 Tobacco use: Secondary | ICD-10-CM | POA: Insufficient documentation

## 2014-05-13 DIAGNOSIS — J45909 Unspecified asthma, uncomplicated: Secondary | ICD-10-CM | POA: Insufficient documentation

## 2014-05-13 DIAGNOSIS — Z792 Long term (current) use of antibiotics: Secondary | ICD-10-CM | POA: Insufficient documentation

## 2014-05-13 DIAGNOSIS — K029 Dental caries, unspecified: Secondary | ICD-10-CM | POA: Insufficient documentation

## 2014-05-13 DIAGNOSIS — Z8659 Personal history of other mental and behavioral disorders: Secondary | ICD-10-CM | POA: Insufficient documentation

## 2014-05-13 DIAGNOSIS — K047 Periapical abscess without sinus: Secondary | ICD-10-CM | POA: Insufficient documentation

## 2014-05-13 DIAGNOSIS — K006 Disturbances in tooth eruption: Secondary | ICD-10-CM | POA: Insufficient documentation

## 2014-05-13 DIAGNOSIS — Z791 Long term (current) use of non-steroidal anti-inflammatories (NSAID): Secondary | ICD-10-CM | POA: Insufficient documentation

## 2014-05-13 MED ORDER — HYDROCODONE-ACETAMINOPHEN 5-325 MG PO TABS: 1.0000 | ORAL_TABLET | Freq: Four times a day (QID) | ORAL | Status: DC | PRN

## 2014-05-13 MED ORDER — BUPIVACAINE-EPINEPHRINE (PF) 0.5% -1:200000 IJ SOLN
1.8000 mL | Freq: Once | INTRAMUSCULAR | Status: AC
  Administered 2014-05-13: 1.8 mL

## 2014-05-13 MED ORDER — HYDROCODONE-ACETAMINOPHEN 5-325 MG PO TABS
1.0000 | ORAL_TABLET | Freq: Once | ORAL | Status: AC
Start: 2014-05-13 — End: 2014-05-13
  Administered 2014-05-13: 1 via ORAL
  Filled 2014-05-13: qty 1

## 2014-05-13 MED ORDER — PENICILLIN V POTASSIUM 500 MG PO TABS: 500.0000 mg | ORAL_TABLET | Freq: Three times a day (TID) | ORAL | Status: DC

## 2014-05-13 NOTE — ED Notes (Signed)
Pt from home for eval of dental pain xdays. Denies any fever, n/v/d. Airway intact. VSS

## 2014-05-13 NOTE — Discharge Instructions (Signed)
1. Medications: vicodin, penicillin, usual home medications 2. Treatment: rest, drink plenty of fluids, take medications as prescribed 3. Follow Up: Please followup with dentistry within 1 week for discussion of your diagnoses and further evaluation after today's visit; if you do not have a primary care doctor use the resource guide provided to find one; Return to the ER for high fevers, difficulty breathing, difficulty swallowing or other concerning symptoms   Dental Caries Dental caries (also called tooth decay) is the most common oral disease. It can occur at any age but is more common in children and young adults.  HOW DENTAL CARIES DEVELOPS  The process of decay begins when bacteria and foods (particularly sugars and starches) combine in your mouth to produce plaque. Plaque is a substance that sticks to the hard, outer surface of a tooth (enamel). The bacteria in plaque produce acids that attack enamel. These acids may also attack the root surface of a tooth (cementum) if it is exposed. Repeated attacks dissolve these surfaces and create holes in the tooth (cavities). If left untreated, the acids destroy the other layers of the tooth.  RISK FACTORS  Frequent sipping of sugary beverages.   Frequent snacking on sugary and starchy foods, especially those that easily get stuck in the teeth.   Poor oral hygiene.   Dry mouth.   Substance abuse such as methamphetamine abuse.   Broken or poor-fitting dental restorations.   Eating disorders.   Gastroesophageal reflux disease (GERD).   Certain radiation treatments to the head and neck. SYMPTOMS In the early stages of dental caries, symptoms are seldom present. Sometimes white, chalky areas may be seen on the enamel or other tooth layers. In later stages, symptoms may include:  Pits and holes on the enamel.  Toothache after sweet, hot, or cold foods or drinks are consumed.  Pain around the tooth.  Swelling around the  tooth. DIAGNOSIS  Most of the time, dental caries is detected during a regular dental checkup. A diagnosis is made after a thorough medical and dental history is taken and the surfaces of your teeth are checked for signs of dental caries. Sometimes special instruments, such as lasers, are used to check for dental caries. Dental X-ray exams may be taken so that areas not visible to the eye (such as between the contact areas of the teeth) can be checked for cavities.  TREATMENT  If dental caries is in its early stages, it may be reversed with a fluoride treatment or an application of a remineralizing agent at the dental office. Thorough brushing and flossing at home is needed to aid these treatments. If it is in its later stages, treatment depends on the location and extent of tooth destruction:   If a small area of the tooth has been destroyed, the destroyed area will be removed and cavities will be filled with a material such as gold, silver amalgam, or composite resin.   If a large area of the tooth has been destroyed, the destroyed area will be removed and a cap (crown) will be fitted over the remaining tooth structure.   If the center part of the tooth (pulp) is affected, a procedure called a root canal will be needed before a filling or crown can be placed.   If most of the tooth has been destroyed, the tooth may need to be pulled (extracted). HOME CARE INSTRUCTIONS You can prevent, stop, or reverse dental caries at home by practicing good oral hygiene. Good oral hygiene includes:  Thoroughly cleaning your teeth at least twice a day with a toothbrush and dental floss.   Using a fluoride toothpaste. A fluoride mouth rinse may also be used if recommended by your dentist or health care provider.   Restricting the amount of sugary and starchy foods and sugary liquids you consume.   Avoiding frequent snacking on these foods and sipping of these liquids.   Keeping regular visits with a  dentist for checkups and cleanings. PREVENTION   Practice good oral hygiene.  Consider a dental sealant. A dental sealant is a coating material that is applied by your dentist to the pits and grooves of teeth. The sealant prevents food from being trapped in them. It may protect the teeth for several years.  Ask about fluoride supplements if you live in a community without fluorinated water or with water that has a low fluoride content. Use fluoride supplements as directed by your dentist or health care provider.  Allow fluoride varnish applications to teeth if directed by your dentist or health care provider. Document Released: 03/05/2002 Document Revised: 10/28/2013 Document Reviewed: 06/15/2012 [facility] Patient Information 2015 [place], [place]. This information is not intended to replace advice given to you by your health care provider. Make sure you discuss any questions you have with your health care provider.    Emergency Department Resource Guide 1) Find a Doctor and Pay Out of Pocket Although you won't have to find out who is covered by your insurance plan, it is a good idea to ask around and get recommendations. You will then need to call the office and see if the doctor you have chosen will accept you as a new patient and what types of options they offer for patients who are self-pay. Some doctors offer discounts or will set up payment plans for their patients who do not have insurance, but you will need to ask so you aren't surprised when you get to your appointment.  2) Contact Your Local Health Department Not all health departments have doctors that can see patients for sick visits, but many do, so it is worth a call to see if yours does. If you don't know where your local health department is, you can check in your phone book. The CDC also has a tool to help you locate your state's health department, and many state websites also have listings of all of their local health  departments.  3) Find a Walk-in Clinic If your illness is not likely to be very severe or complicated, you may want to try a walk in clinic. These are popping up all over the country in pharmacies, drugstores, and shopping centers. They're usually staffed by nurse practitioners or physician assistants that have been trained to treat common illnesses and complaints. They're usually fairly quick and inexpensive. However, if you have serious medical issues or chronic medical problems, these are probably not your best option.  No Primary Care Doctor: - Call Health Connect at  [phone] - they can help you locate a primary care doctor that  accepts your insurance, provides certain services, etc. - Physician Referral Service- [phone]  Chronic Pain Problems: Organization         Address  Phone   Notes  [facility]  [phone] Patients need to be referred by their primary care doctor.   Medication Assistance: Organization         Address  Phone   Notes  [facility] Medication Assistance Program [address]  [address] [place], [place] [address] [phone]) [phone] --Must be a resident of [place] -- Must have NO insurance coverage whatsoever (no Medicaid/ Medicare, etc.) -- The pt. MUST have a primary care doctor that directs their care regularly and follows them in the community   [facility]ist  [phone]   [employer]  [phone]) [phone]    Agencies that provide inexpensive medical care: Organization         Address  Phone   Notes  [facility]  [phone]   [facility]    [phone]   [facility] [address] [place], [place] [address] [phone]) [phone]   [facility] [address], [place] [phone]) [phone]   [facility]    [phone](336) [phone]   [facility]    [phone]   [facility]  [address], [place] Phone:  [phone](336)  [phone], Fax:  [phone] Hours of Operation:  9 am - 6 pm, M-F.  Also accepts Medicaid/Medicare and self-pay.  [facility]  [address], [place] Phone: [phone]) [phone], Fax: [phone]) [phone]. Hours of Operation:  8:30 am - 5:30 pm, M-F.  Also accepts Medicaid and self-pay.  [facility] [place] [address], [place] Phone: [phone](336) [phone]   [facility] [address], [place], [place] [phone], Ext. [phone] Mondays & Thursdays: 7-9 AM.  First 15 patients are seen on a first come, first serve basis.    Medicaid-accepting [facility] [place] Providers:  Organization         Address  Phone   Notes  [facility] [address], [place] [phone]) [phone] Also accepts self-pay patients.  [facility] [address] [place] [address], [place]  [phone]) [phone]   [facility] [address], [place] [phone]) [phone]   [facility] [address] [place] [address], [place] [phone]) [phone]   Renaye RakersVeita Bland [address], [place]   [phone]) [phone] Only accepts [place] Access [place] patients after they have their name applied to their card.   Self-Pay (no insurance) in [facility]:  Organization         Address  Phone   Notes  Sickle Cell Patients, [facility] [address], [place] [phone](336) [phone]   [facility] [address], [place] [phone]) [phone]   [facility] [place]  [address], [place] [phone]   [facility]/Dr. Osei-Bonsu  [address] [place] [address], [place] or [phone], [place] [phone] Phone number for both [place] and [place] locations is the same.  [facility] [address], [place] [phone](336) [phone]   [facility] [place] [address] [place] [address], [place] or [address] [phone])  [phone]   [facility] [address], [place] [phone]) [phone], phone; [phone]) [phone], fax Sees patients 1st and 3rd Saturday of every month.  Must not qualify for public or private insurance (i.e. Medicaid, Medicare, [place] Health Choice, Veterans' Benefits)  Household income should be no more than 200% of the poverty level The clinic cannot treat you if you are pregnant or think you are pregnant  Sexually transmitted diseases are not treated at the clinic.    Dental  Care: Organization         Address  Phone  Notes  [facility] Department of [facility] [address] [place], [place] [phone] Accepts children up to age 10 who are enrolled in [place] or [place] Health Choice; pregnant women with a Medicaid card; and children who have applied for Medicaid or [place] Health Choice, but were declined, whose parents can pay a reduced fee at time of service.  [facility]  [address], [place] [phone] Accepts children up to age 58 who are enrolled in [place] or [place] Health Choice; pregnant women with a Medicaid card; and children who have applied for Medicaid or [place] Health Choice, but were declined, whose parents can pay a reduced fee at time of service.  [facility]  [address] [place], [place] [phone] Patients are seen by appointment only. Walk-ins are not accepted. [facility] will see patients 42 years of age and older. Monday - Tuesday (8am-5pm) Most Wednesdays (8:30-5pm) $30 per visit, cash only  [facility] Adult Dental Access PRO[facility]  [address], [facility] [phone] Patients are seen by appointment only. Walk-ins are not accepted. [facility] will see patients 46 years of age and older. One Wednesday Evening (Monthly: Volunteer Based).  $30 per visit, cash only  [employer]  [phone] for adults;  Children under age 64, call Graduate Pediatric Dentistry at [phone]. Children aged 86-14, please call [phone] to request a pediatric application.  Dental services are provided in all areas of dental care including fillings, crowns and bridges, complete and partial dentures, implants, gum treatment, root canals, and extractions. Preventive care is also provided. Treatment is provided to both adults and children. Patients are selected via a lottery and there is often a waiting list.   [facility] [address], [place]  [phone] [email or web address]   [facility] [address] [place], [place] [phone], Ext. [phone] Second and Fourth Thursday of each month, opens at 6:30 AM; Clinic ends at 9 AM.  Patients are seen on a first-come first-served basis, and a limited number are seen during each clinic.   [facility]  [address] [place], [place] [phone]   Eligibility Requirements You must have lived in [place], [place], or [place] counties for at least the last three months.   You cannot be eligible for state or federal sponsored [place], including [employer], [place], or [employer].   You generally cannot be eligible for healthcare insurance through your employer.    How to apply: Eligibility screenings are held every Tuesday and Wednesday afternoon from 1:00 pm until 4:00 pm. You do not need an appointment for the interview!  [facility] [address] [place], [place] [phone]   [facility]  [phone]   [facility]  [phone]   [facility]  [phone]    Behavioral Health Resources in the Community: Intensive Outpatient Programs Organization         Address  Phone  Notes  [facility] [address], [place], [place] [phone]   [facility] Outpatient [address] [place], [place] [phone]   [facility] [address] [place], [place]  [phone]   [facility] [address],  [place], [place] [phone] or [phone]   Substance Abuse Resources Organization         Address  Phone  Notes  [facility]  [phone]   [facility]  [phone]   [facility]  [phone]   Floydene Flock  [phone]   Residential & Outpatient Substance Abuse Program  [phone]   Psychological Services Organization         Address  Phone  Notes  [facility]  [phone]   [facility]  [phone]   [facility] [address], [place] [phone] or [phone]    Mobile Crisis Teams Organization         Address  Phone  Notes  [facility], Mobile Crisis Care Unit  [phone]   [facility]  [address] [place], [place] [phone]   Doristine Locks [address] [place] [phone]    Self-Help/Support Groups Organization         Address  Phone             Notes  [facility] [place] - variety of support groups  336- I7437963 Call for more information  Narcotics Anonymous (NA), [facility] [address], [employer] [place]  2 meetings at this location   Statistician         Address  Phone  Notes  [facility] [address],    [place]  [phone]   [facility]  [address] [place] [address], [place], [place] [phone]   [facility] [address] [place], [place] [phone] Admissions: 8am-3pm M-F  [facility] [address]    [place], [place] [phone]   [facility] [address] [place], [place], [place] [phone]   [facility] [address],  [place], [place] [phone]   [facility] - Intensive  Outpatient [address], [place], [place] [phone]   [facility] ([facility] [address] [place]  [place], [place] [phone] or [phone]   [facility] ([facility]) [address] [place], [place] [phone] Accepts Medicaid  [facility] [place] [address]  [place] [phone] Substance Abuse/Addiction Treatment   [facility] Organization         Address  Phone  Notes  [facility]  5317242452   Angie Fava, PhD [address] [place], [place]   [phone] or [phone]   [facility] Behavioral   [address] [place], [place] [phone]   [facility] [address] [place], [place] [phone] Insurance/Medicaid/sponsorship through [facility] [address]                                    [place], [place] [phone] Therapy/tele-psych/case  [facility] [address], [place] [phone]    Dr. Lolly Mustache  [phone]   [facility]  [employer] [facility] Dept. 1) [address]  2) [address] [place] 3)  [address], [place] [phone]  [phone]   [facility] Child Abuse Hotline 8100093642 or [phone] (After Hours)

## 2014-05-13 NOTE — ED Provider Notes (Signed)
CSN: [id number]     Arrival date & time 05/13/14  1159 History  This chart was scribed for non-physician practitioner, Monica ForthHannah Ada Woodbury, PA-C working with Monica LennertJoseph L Zammit, MD by Greggory StallionKayla Holloway, ED scribe. This patient was seen in room TR05C/TR05C and the patient's care was started at 12:19 PM.    Chief Complaint  Patient presents with  . Dental Pain   The history is provided by the patient. No language interpreter was used.    HPI Comments: Monica Holloway is a 28 y.o. female who presents to the Emergency Department complaining of lower frontal dental pain that started one week ago. States the tooth has been broken for one year. Pressure and temperature changes worsen the pain. Pt has used Orajel with no relief but not taken any medications. She does not have a dentist but states she is waiting on her Medicaid to go through. NO fever, chills, N/V, headaches  Past Medical History  Diagnosis Date  . Asthma   . Panic disorder   . Panic attacks    History reviewed. No pertinent past surgical history. No family history on file. History  Substance Use Topics  . Smoking status: Current Every Day Smoker -- 1.00 packs/day    Types: Cigarettes  . Smokeless tobacco: Never Used  . Alcohol Use: Yes     Comment: occaisional    OB History    Gravida Para Term Preterm AB TAB SAB Ectopic Multiple Living   3 2             Review of Systems  Constitutional: Negative for fever, chills and appetite change.  HENT: Positive for dental problem and facial swelling. Negative for drooling, ear pain, nosebleeds, postnasal drip, rhinorrhea and trouble swallowing.   Eyes: Negative for pain and redness.  Respiratory: Negative for cough and wheezing.   Cardiovascular: Negative for chest pain.  Gastrointestinal: Negative for nausea, vomiting and abdominal pain.  Musculoskeletal: Negative for neck pain and neck stiffness.  Skin: Negative for color change and rash.  Neurological: Negative for weakness,  light-headedness and headaches.  All other systems reviewed and are negative.  Allergies  Review of patient's allergies indicates no known allergies.  Home Medications   Prior to Admission medications   Medication Sig Start Date End Date Taking? Authorizing Provider  amoxicillin (AMOXIL) 500 MG capsule Take 1 capsule (500 mg total) by mouth 3 (three) times daily. 04/07/14   Courtney A Forcucci, PA-C  HYDROcodone-acetaminophen (NORCO/VICODIN) 5-325 MG per tablet Take 1 tablet by mouth every 6 (six) hours as needed for moderate pain or severe pain. 05/13/14   Vian Fluegel, PA-C  ibuprofen (ADVIL,MOTRIN) 800 MG tablet Take 1 tablet (800 mg total) by mouth 3 (three) times daily. 04/07/14   Courtney A Forcucci, PA-C  lidocaine (XYLOCAINE) 2 % solution Use as directed 20 mLs in the mouth or throat as needed for mouth pain. 04/07/14   Courtney A Forcucci, PA-C  ondansetron (ZOFRAN ODT) 4 MG disintegrating tablet 4mg  ODT q4 hours prn nausea/vomiting 04/27/14   Audree CamelScott T Goldston, MD  penicillin v potassium (VEETID) 500 MG tablet Take 1 tablet (500 mg total) by mouth 3 (three) times daily. 05/13/14   Kyion Gautier, PA-C   BP 135/87 mmHg  Pulse 95  Temp(Src) 98.2 F (36.8 C) (Oral)  Resp 18  SpO2 100%  LMP 04/01/2014 (Approximate)   Physical Exam  Constitutional: She appears well-developed and well-nourished.  HENT:  Head: Normocephalic.  Right Ear: Tympanic membrane, external ear and ear  canal normal.  Left Ear: Tympanic membrane, external ear and ear canal normal.  Nose: Nose normal. Right sinus exhibits no maxillary sinus tenderness and no frontal sinus tenderness. Left sinus exhibits no maxillary sinus tenderness and no frontal sinus tenderness.  Mouth/Throat: Uvula is midline, oropharynx is clear and moist and mucous membranes are normal. No oral lesions. Abnormal dentition. Dental caries present. No uvula swelling or lacerations. No oropharyngeal exudate, posterior oropharyngeal  edema, posterior oropharyngeal erythema or tonsillar abscesses.  Tooth #25 is decayed with a small area of erythema and induration at the base of the tooth without gross abscess or area of fluctuance No erythema of the skin of the face No induration No woody induration to the floor of the mouth or pain to palpation of the soft tissue of the inferior jaw  Eyes: Conjunctivae are normal. Pupils are equal, round, and reactive to light. Right eye exhibits no discharge. Left eye exhibits no discharge.  Neck: Normal range of motion. Neck supple.  No stridor Handling secretions without difficulty No nuchal rigidity No cervical lymphadenopathy  Cardiovascular: Normal rate, regular rhythm and normal heart sounds.   Pulmonary/Chest: Effort normal. No respiratory distress.  Equal chest rise  Abdominal: Soft. Bowel sounds are normal. She exhibits no distension. There is no tenderness.  Lymphadenopathy:    She has no cervical adenopathy.  Neurological: She is alert.  Skin: Skin is warm and dry.  Psychiatric: She has a normal mood and affect.  Nursing note and vitals reviewed.   ED Course  Dental Date/Time: 05/13/2014 12:46 PM Performed by: Monica Forth Authorized by: Monica Forth Consent: Verbal consent obtained. Risks and benefits: risks, benefits and alternatives were discussed Consent given by: patient Patient understanding: patient states understanding of the procedure being performed Patient consent: the patient's understanding of the procedure matches consent given Procedure consent: procedure consent matches procedure scheduled Relevant documents: relevant documents present and verified Site marked: the operative site was marked Required items: required blood products, implants, devices, and special equipment available Patient identity confirmed: verbally with patient and arm band Time out: Immediately prior to procedure a "time out" was called to verify the correct  patient, procedure, equipment, support staff and site/side marked as required. Preparation: Patient was prepped and draped in the usual sterile fashion. Local anesthesia used: yes Anesthesia: local infiltration Local anesthetic: bupivacaine 0.5% with epinephrine Anesthetic total: 1 ml Patient sedated: no Patient tolerance: Patient tolerated the procedure well with no immediate complications Comments: Dental block of tooth number 5 with complete relief and no immediate complications.   (including critical care time)  DIAGNOSTIC STUDIES: Oxygen Saturation is 100% on RA, normal by my interpretation.    COORDINATION OF CARE: 12:22 PM-Discussed treatment plan which includes a dental block, pain medication and an antibiotic with pt at bedside and pt agreed to plan. Will give pt dental referrals and advised her to follow up.   Labs Review Labs Reviewed - No data to display  Imaging Review No results found.   EKG Interpretation None      MDM   Final diagnoses:  Pain due to dental caries  Dental infection   Monica Holloway presents with dental pain. Patient with toothache.  No gross abscess.  Exam unconcerning for Ludwig's angina or spread of infection.  Dental block of tooth #25 with complete relief.Will treat with penicillin and pain medicine.  Urged patient to follow-up with dentist.  patient given resource list for dentist.  I have personally reviewed patient's vitals, nursing note  and any pertinent labs or imaging.  I performed an focused physical exam; undressed when appropriate .    It has been determined that no acute conditions requiring further emergency intervention are present at this time. The patient/guardian have been advised of the diagnosis and plan. I reviewed any labs and imaging including any potential incidental findings. We have discussed signs and symptoms that warrant return to the ED and they are listed in the discharge instructions.    Vital signs are  stable at discharge.   BP 135/87 mmHg  Pulse 95  Temp(Src) 98.2 F (36.8 C) (Oral)  Resp 18  SpO2 100%  LMP 04/01/2014 (Approximate)   I personally performed the services described in this documentation, which was scribed in my presence. The recorded information has been reviewed and is accurate.  Dahlia ClientHannah Shamari Lofquist, PA-C 05/13/14 1247  Monica LennertJoseph L Zammit, MD 05/13/14 [id number]

## 2014-05-14 ENCOUNTER — Encounter (HOSPITAL_COMMUNITY): Payer: Self-pay | Admitting: Emergency Medicine

## 2014-05-14 ENCOUNTER — Emergency Department (HOSPITAL_COMMUNITY)
Admission: EM | Admit: 2014-05-14 | Discharge: 2014-05-14 | Disposition: A | Discharge status: Home / Self Care | Payer: No Typology Code available for payment source | Attending: Emergency Medicine | Admitting: Emergency Medicine

## 2014-05-14 DIAGNOSIS — Z8659 Personal history of other mental and behavioral disorders: Secondary | ICD-10-CM | POA: Insufficient documentation

## 2014-05-14 DIAGNOSIS — J45909 Unspecified asthma, uncomplicated: Secondary | ICD-10-CM | POA: Insufficient documentation

## 2014-05-14 DIAGNOSIS — Z720 Tobacco use: Secondary | ICD-10-CM | POA: Insufficient documentation

## 2014-05-14 DIAGNOSIS — R109 Unspecified abdominal pain: Secondary | ICD-10-CM | POA: Insufficient documentation

## 2014-05-14 DIAGNOSIS — Z3202 Encounter for pregnancy test, result negative: Secondary | ICD-10-CM | POA: Insufficient documentation

## 2014-05-14 DIAGNOSIS — R1030 Lower abdominal pain, unspecified: Secondary | ICD-10-CM

## 2014-05-14 LAB — CBC
HCT: 42.9 % (ref 36.0–46.0)
Hemoglobin: 14.4 g/dL (ref 12.0–15.0)
MCH: 33.6 pg (ref 26.0–34.0)
MCHC: 33.6 g/dL (ref 30.0–36.0)
MCV: 100.2 fL — ABNORMAL HIGH (ref 78.0–100.0)
Platelets: 283 10*3/uL (ref 150–400)
RBC: 4.28 MIL/uL (ref 3.87–5.11)
RDW: 12.7 % (ref 11.5–15.5)
WBC: 8.4 10*3/uL (ref 4.0–10.5)

## 2014-05-14 LAB — URINALYSIS, ROUTINE W REFLEX MICROSCOPIC
GLUCOSE, UA: NEGATIVE mg/dL
Hgb urine dipstick: NEGATIVE
Ketones, ur: 15 mg/dL — AB
NITRITE: NEGATIVE
PH: 6 (ref 5.0–8.0)
PROTEIN: 30 mg/dL — AB
Specific Gravity, Urine: 1.041 — ABNORMAL HIGH (ref 1.005–1.030)
Urobilinogen, UA: 4 mg/dL — ABNORMAL HIGH (ref 0.0–1.0)

## 2014-05-14 LAB — COMPREHENSIVE METABOLIC PANEL
ALT: 17 U/L (ref 0–35)
AST: 18 U/L (ref 0–37)
Albumin: 3.8 g/dL (ref 3.5–5.2)
Alkaline Phosphatase: 69 U/L (ref 39–117)
Anion gap: 15 (ref 5–15)
BUN: 10 mg/dL (ref 6–23)
CO2: 21 mEq/L (ref 19–32)
Calcium: 9.6 mg/dL (ref 8.4–10.5)
Chloride: 101 mEq/L (ref 96–112)
Creatinine, Ser: 0.8 mg/dL (ref 0.50–1.10)
GFR calc Af Amer: >90 mL/min (ref >90)
GFR calc non Af Amer: >90 mL/min (ref >90)
Glucose, Bld: 96 mg/dL (ref 70–99)
Potassium: 4.3 mEq/L (ref 3.7–5.3)
Sodium: 137 mEq/L (ref 137–147)
Total Bilirubin: 0.9 mg/dL (ref 0.3–1.2)
Total Protein: 8.4 g/dL — ABNORMAL HIGH (ref 6.0–8.3)

## 2014-05-14 LAB — WET PREP, GENITAL
Trich, Wet Prep: NONE SEEN
Yeast Wet Prep HPF POC: NONE SEEN

## 2014-05-14 LAB — LIPASE, BLOOD: Lipase: 41 U/L (ref 11–59)

## 2014-05-14 LAB — URINE MICROSCOPIC-ADD ON

## 2014-05-14 LAB — POC URINE PREG, ED: Preg Test, Ur: NEGATIVE

## 2014-05-14 MED ORDER — MORPHINE SULFATE 4 MG/ML IJ SOLN
4.0000 mg | Freq: Once | INTRAMUSCULAR | Status: AC
  Administered 2014-05-14: 4 mg via INTRAVENOUS
  Filled 2014-05-14: qty 1

## 2014-05-14 MED ORDER — FAMOTIDINE 20 MG PO TABS: 20.0000 mg | ORAL_TABLET | Freq: Two times a day (BID) | ORAL | Status: DC

## 2014-05-14 MED ORDER — GI COCKTAIL ~~LOC~~
30.0000 mL | Freq: Once | ORAL | Status: AC
  Administered 2014-05-14: 30 mL via ORAL
  Filled 2014-05-14: qty 30

## 2014-05-14 MED ORDER — FAMOTIDINE IN NACL 20-0.9 MG/50ML-% IV SOLN
20.0000 mg | Freq: Once | INTRAVENOUS | Status: AC
  Administered 2014-05-14: 20 mg via INTRAVENOUS
  Filled 2014-05-14: qty 50

## 2014-05-14 MED ORDER — PANTOPRAZOLE SODIUM 40 MG IV SOLR
40.0000 mg | Freq: Once | INTRAVENOUS | Status: AC
  Administered 2014-05-14: 40 mg via INTRAVENOUS
  Filled 2014-05-14: qty 40

## 2014-05-14 NOTE — ED Notes (Signed)
Pt c/o abd pain since taking medication given last night when seen here for dental pain

## 2014-05-14 NOTE — ED Provider Notes (Signed)
CSN: [id number]     Arrival date & time 05/14/14  1340 History   First MD Initiated Contact with Patient 05/14/14 1421     Chief Complaint  Patient presents with  . Abdominal Pain     (Consider location/radiation/quality/duration/timing/severity/associated sxs/prior Treatment) HPI  Pt is a 28yo female presenting to ED with c/o lower abdominal pain that started last night after pt states she was evaluated her for dental pain. Per medical records, pt was given one dose of norco and PCN in ED, discharged home with PCN. Pain is constant, tender and sore, 10/10, nothing makes pain better or worse. Pt states initially she could not recall medications she was prescribed yesterday, then stated she was not taking the antibiotic, then admitted to taking "3 pills" from her boyfriend who was taking medication for his tooth pain.  Denies fever, n/v/d. Denies urinary or vaginal symptoms. States LMP was 2 weeks ago and she is not concerned for STDs.  Denies hx of abdominal surgeries.   Past Medical History  Diagnosis Date  . Asthma   . Panic disorder   . Panic attacks    History reviewed. No pertinent past surgical history. History reviewed. No pertinent family history. History  Substance Use Topics  . Smoking status: Current Every Day Smoker -- 1.00 packs/day    Types: Cigarettes  . Smokeless tobacco: Never Used  . Alcohol Use: Yes     Comment: occaisional    OB History    Gravida Para Term Preterm AB TAB SAB Ectopic Multiple Living   3 2             Review of Systems  Constitutional: Negative for fever and chills.  Respiratory: Negative for cough and shortness of breath.   Cardiovascular: Negative for chest pain and palpitations.  Gastrointestinal: Positive for abdominal pain. Negative for nausea, vomiting, diarrhea, constipation and blood in stool.  Genitourinary: Positive for hematuria ( change in color of urine). Negative for dysuria, frequency and flank pain.  All other systems  reviewed and are negative.     Allergies  Review of patient's allergies indicates no known allergies.  Home Medications   Prior to Admission medications   Medication Sig Start Date End Date Taking? Authorizing Provider  amoxicillin (AMOXIL) 500 MG capsule Take 1 capsule (500 mg total) by mouth 3 (three) times daily. Patient not taking: Reported on 05/14/2014 04/07/14   Courtney A Forcucci, PA-C  famotidine (PEPCID) 20 MG tablet Take 1 tablet (20 mg total) by mouth 2 (two) times daily. 05/14/14   Junius Finner, PA-C  HYDROcodone-acetaminophen (NORCO/VICODIN) 5-325 MG per tablet Take 1 tablet by mouth every 6 (six) hours as needed for moderate pain or severe pain. Patient not taking: Reported on 05/14/2014 05/13/14   Dahlia Client Muthersbaugh, PA-C  ibuprofen (ADVIL,MOTRIN) 800 MG tablet Take 1 tablet (800 mg total) by mouth 3 (three) times daily. Patient not taking: Reported on 05/14/2014 04/07/14   Courtney A Forcucci, PA-C  lidocaine (XYLOCAINE) 2 % solution Use as directed 20 mLs in the mouth or throat as needed for mouth pain. Patient not taking: Reported on 05/14/2014 04/07/14   Courtney A Forcucci, PA-C  ondansetron (ZOFRAN ODT) 4 MG disintegrating tablet 4mg  ODT q4 hours prn nausea/vomiting Patient not taking: Reported on 05/14/2014 04/27/14   Audree Camel, MD  penicillin v potassium (VEETID) 500 MG tablet Take 1 tablet (500 mg total) by mouth 3 (three) times daily. 05/13/14   Hannah Muthersbaugh, PA-C   BP 125/82 mmHg  Pulse 72  Temp(Src) 98.6 F (37 C) (Oral)  Resp 15  SpO2 99%  LMP 04/01/2014 (Approximate) Physical Exam  Constitutional: She appears well-developed and well-nourished. No distress.  HENT:  Head: Normocephalic and atraumatic.  Eyes: Conjunctivae are normal. No scleral icterus.  Neck: Normal range of motion.  Cardiovascular: Normal rate, regular rhythm and normal heart sounds.   Pulmonary/Chest: Effort normal and breath sounds normal. No respiratory distress.  She has no wheezes. She has no rales. She exhibits no tenderness.  Abdominal: Soft. Bowel sounds are normal. She exhibits no distension and no mass. There is tenderness. There is no rebound and no guarding.  Soft, non-distended. Tenderness in lower abdomen. Voluntary guarding.  No rebound or masses. No CVAT  Genitourinary:  Chaperoned exam External genitalia: normal Vaginal: positive discharge-yellow/white, thin, negative bleeding Cervix: closed: positive discharge, negative bleeding. Cervical motion absent,  Adnexa palpated: negative adnexal tenderness, negative adnexal mass  Musculoskeletal: Normal range of motion.  Neurological: She is alert.  Skin: Skin is warm and dry. She is not diaphoretic.  Nursing note and vitals reviewed.   ED Course  Procedures (including critical care time) Labs Review Labs Reviewed  WET PREP, GENITAL - Abnormal; Notable for the following:    Clue Cells Wet Prep HPF POC FEW (*)    WBC, Wet Prep HPF POC FEW (*)    All other components within normal limits  URINALYSIS, ROUTINE W REFLEX MICROSCOPIC - Abnormal; Notable for the following:    Color, Urine ORANGE (*)    APPearance CLOUDY (*)    Specific Gravity, Urine 1.041 (*)    Bilirubin Urine SMALL (*)    Ketones, ur 15 (*)    Protein, ur 30 (*)    Urobilinogen, UA 4.0 (*)    Leukocytes, UA SMALL (*)    All other components within normal limits  CBC - Abnormal; Notable for the following:    MCV 100.2 (*)    All other components within normal limits  COMPREHENSIVE METABOLIC PANEL - Abnormal; Notable for the following:    Total Protein 8.4 (*)    All other components within normal limits  URINE MICROSCOPIC-ADD ON - Abnormal; Notable for the following:    Squamous Epithelial / LPF MANY (*)    Bacteria, UA FEW (*)    All other components within normal limits  GC/CHLAMYDIA PROBE AMP  LIPASE, BLOOD  POC URINE PREG, ED    Imaging Review No results found.   EKG Interpretation None      MDM    Final diagnoses:  Lower abdominal pain    pt is a 28yo female presenting to ED with lower abdominal pain after reports of taking "3 pills" from her boyfriend for which he is taking for his own tooth pain. Pt unsure of name of medication. Denies taking antibiotic, PCN, prescribed to her last night for her own dental pain. No n/v/d. No hx of abdominal pain.  Vitals: afebrile, hemodynamically stable. Abdomen: soft, tenderness along lower abdomen, voluntary guarding.  Pt does not have guarding when immersed in conversation. No rebound or masses.  Low concern for surgical abdomen.   Labs: UA-urine is orange in color. Pt denies taking any OTC medications, few ketones, otherwise unremarkable, no evidence of UTI. Urine preg: negative. Lipase: normal, not concerned for pancreatitis. CBC and CMP: unremarkable.  On pelvic exam: small amount of yellow/white discharge. No CMT, adnexal tenderness or mass.  Wet prep: unremarkable.  Upon reevaluation, pt sitting in bed, smiling, talking on the  phone. Pt states she still has some pain, however, pain has improved from 10/10 to 2/10.   No evidence of emergent process taking place at this time. Not concerned for ectopic pregnancy or ovarian torsion.  Will tx as gastritis, pepcid prescribed. Strongly discouraged pt from taking other people's medication, especially if she is unsure of what medication she is taking.  Home care instructions provided. [facility] Return precautions provided. Pt verbalized understanding and agreement with tx plan.      Junius FinnerErin O'Malley, PA-C 05/14/14 1602  Junius FinnerErin O'Malley, PA-C 05/14/14 1603  Rolland PorterMark James, MD 05/21/14 [phone]

## 2014-05-14 NOTE — Discharge Instructions (Signed)
You should never take any medication that is not prescribed to you as you may have unexpected side effects that could be harmful to your health including allergic reaction.   Please only take medications prescribed to you and use as directed including that antibiotic prescribed to you yesterday for your tooth as well as the medication prescribed today to help with your abdominal pain. See below for further instructions.   Abdominal Pain, Women Abdominal (stomach, pelvic, or belly) pain can be caused by many things. It is important to tell your doctor:  The location of the pain.  Does it come and go or is it present all the time?  Are there things that start the pain (eating certain foods, exercise)?  Are there other symptoms associated with the pain (fever, nausea, vomiting, diarrhea)? All of this is helpful to know when trying to find the cause of the pain. CAUSES   Stomach: virus or bacteria infection, or ulcer.  Intestine: appendicitis (inflamed appendix), regional ileitis (Crohn's disease), ulcerative colitis (inflamed colon), irritable bowel syndrome, diverticulitis (inflamed diverticulum of the colon), or cancer of the stomach or intestine.  Gallbladder disease or stones in the gallbladder.  Kidney disease, kidney stones, or infection.  Pancreas infection or cancer.  Fibromyalgia (pain disorder).  Diseases of the female organs:  Uterus: fibroid (non-cancerous) tumors or infection.  Fallopian tubes: infection or tubal pregnancy.  Ovary: cysts or tumors.  Pelvic adhesions (scar tissue).  Endometriosis (uterus lining tissue growing in the pelvis and on the pelvic organs).  Pelvic congestion syndrome (female organs filling up with blood just before the menstrual period).  Pain with the menstrual period.  Pain with ovulation (producing an egg).  Pain with an IUD (intrauterine device, birth control) in the uterus.  Cancer of the female organs.  Functional pain (pain  not caused by a disease, may improve without treatment).  Psychological pain.  Depression. DIAGNOSIS  Your doctor will decide the seriousness of your pain by doing an examination.  Blood tests.  X-rays.  Ultrasound.  CT scan (computed tomography, special type of X-ray).  MRI (magnetic resonance imaging).  Cultures, for infection.  Barium enema (dye inserted in the large intestine, to better view it with X-rays).  Colonoscopy (looking in intestine with a lighted tube).  Laparoscopy (minor surgery, looking in abdomen with a lighted tube).  Major abdominal exploratory surgery (looking in abdomen with a large incision). TREATMENT  The treatment will depend on the cause of the pain.   Many cases can be observed and treated at home.  Over-the-counter medicines recommended by your caregiver.  Prescription medicine.  Antibiotics, for infection.  Birth control pills, for painful periods or for ovulation pain.  Hormone treatment, for endometriosis.  Nerve blocking injections.  Physical therapy.  Antidepressants.  Counseling with a psychologist or psychiatrist.  Minor or major surgery. HOME CARE INSTRUCTIONS   Do not take laxatives, unless directed by your caregiver.  Take over-the-counter pain medicine only if ordered by your caregiver. Do not take aspirin because it can cause an upset stomach or bleeding.  Try a clear liquid diet (broth or water) as ordered by your caregiver. Slowly move to a bland diet, as tolerated, if the pain is related to the stomach or intestine.  Have a thermometer and take your temperature several times a day, and record it.  Bed rest and sleep, if it helps the pain.  Avoid sexual intercourse, if it causes pain.  Avoid stressful situations.  Keep your follow-up appointments and  tests, as your caregiver orders.  If the pain does not go away with medicine or surgery, you may try:  Acupuncture.  Relaxation exercises (yoga,  meditation).  Group therapy.  Counseling. SEEK MEDICAL CARE IF:   You notice certain foods cause stomach pain.  Your home care treatment is not helping your pain.  You need stronger pain medicine.  You want your IUD removed.  You feel faint or lightheaded.  You develop nausea and vomiting.  You develop a rash.  You are having side effects or an allergy to your medicine. SEEK IMMEDIATE MEDICAL CARE IF:   Your pain does not go away or gets worse.  You have a fever.  Your pain is felt only in portions of the abdomen. The right side could possibly be appendicitis. The left lower portion of the abdomen could be colitis or diverticulitis.  You are passing blood in your stools (bright red or black tarry stools, with or without vomiting).  You have blood in your urine.  You develop chills, with or without a fever.  You pass out. MAKE SURE YOU:   Understand these instructions.  Will watch your condition.  Will get help right away if you are not doing well or get worse. Document Released: 04/10/2007 Document Revised: 10/28/2013 Document Reviewed: 04/30/2009 [facility] Patient Information 2015 [employer] [place] [place]. This information is not intended to replace advice given to you by your health care provider. Make sure you discuss any questions you have with your health care provider.

## 2014-05-14 NOTE — ED Notes (Signed)
Pt from home with c/o lower abdominal pain that started last night, pt was seen yesterday for dental pain and was started on rx but pt can't remember which one. Pt denies any vaginal odor or discharge, pt also denies any n/v/d or fevers. nad noted. Pt axox 4.

## 2014-05-15 LAB — GC/CHLAMYDIA PROBE AMP
CT Probe RNA: POSITIVE — AB
GC Probe RNA: POSITIVE — AB

## 2014-05-16 ENCOUNTER — Telehealth: Payer: Self-pay | Admitting: Emergency Medicine

## 2014-05-16 NOTE — Telephone Encounter (Signed)
Positive Chlamydia culture Positive Gonorrhea culture Chart sent to EDP for review 

## 2014-05-19 ENCOUNTER — Telehealth (HOSPITAL_BASED_OUTPATIENT_CLINIC_OR_DEPARTMENT_OTHER): Payer: Self-pay | Admitting: *Deleted

## 2014-05-20 ENCOUNTER — Telehealth: Payer: Self-pay | Admitting: *Deleted

## 2014-05-30 ENCOUNTER — Telehealth (HOSPITAL_COMMUNITY): Payer: Self-pay

## 2014-05-30 NOTE — Telephone Encounter (Signed)
Positive for gonorrhea and chlamydia. Needs to follow up with PCP or [facility] Unable to reach by telephone or mail. No follow up information or treatment changes given to pt.

## 2014-07-02 ENCOUNTER — Emergency Department (HOSPITAL_COMMUNITY)
Admission: EM | Admit: 2014-07-02 | Discharge: 2014-07-02 | Disposition: A | Discharge status: Home / Self Care | Payer: Self-pay | Attending: Emergency Medicine | Admitting: Emergency Medicine

## 2014-07-02 ENCOUNTER — Emergency Department (HOSPITAL_COMMUNITY): Payer: Self-pay

## 2014-07-02 ENCOUNTER — Encounter (HOSPITAL_COMMUNITY): Payer: Self-pay | Admitting: Emergency Medicine

## 2014-07-02 DIAGNOSIS — W108XXA Fall (on) (from) other stairs and steps, initial encounter: Secondary | ICD-10-CM | POA: Insufficient documentation

## 2014-07-02 DIAGNOSIS — W010XXA Fall on same level from slipping, tripping and stumbling without subsequent striking against object, initial encounter: Secondary | ICD-10-CM

## 2014-07-02 DIAGNOSIS — Y998 Other external cause status: Secondary | ICD-10-CM | POA: Insufficient documentation

## 2014-07-02 DIAGNOSIS — Z720 Tobacco use: Secondary | ICD-10-CM | POA: Insufficient documentation

## 2014-07-02 DIAGNOSIS — J45909 Unspecified asthma, uncomplicated: Secondary | ICD-10-CM | POA: Insufficient documentation

## 2014-07-02 DIAGNOSIS — F419 Anxiety disorder, unspecified: Secondary | ICD-10-CM | POA: Insufficient documentation

## 2014-07-02 DIAGNOSIS — S82401A Unspecified fracture of shaft of right fibula, initial encounter for closed fracture: Secondary | ICD-10-CM

## 2014-07-02 DIAGNOSIS — S82421A Displaced transverse fracture of shaft of right fibula, initial encounter for closed fracture: Secondary | ICD-10-CM | POA: Insufficient documentation

## 2014-07-02 DIAGNOSIS — R52 Pain, unspecified: Secondary | ICD-10-CM

## 2014-07-02 DIAGNOSIS — Y9289 Other specified places as the place of occurrence of the external cause: Secondary | ICD-10-CM | POA: Insufficient documentation

## 2014-07-02 DIAGNOSIS — Y9389 Activity, other specified: Secondary | ICD-10-CM | POA: Insufficient documentation

## 2014-07-02 MED ORDER — OXYCODONE-ACETAMINOPHEN 5-325 MG PO TABS
1.0000 | ORAL_TABLET | Freq: Once | ORAL | Status: AC
  Administered 2014-07-02: 1 via ORAL
  Filled 2014-07-02: qty 1

## 2014-07-02 MED ORDER — LORAZEPAM 0.5 MG PO TABS
1.0000 mg | ORAL_TABLET | Freq: Once | ORAL | Status: AC
  Administered 2014-07-02: 1 mg via ORAL
  Filled 2014-07-02: qty 2

## 2014-07-02 MED ORDER — HYDROCODONE-ACETAMINOPHEN 5-325 MG PO TABS: 1.0000 | ORAL_TABLET | Freq: Four times a day (QID) | ORAL | Status: DC | PRN

## 2014-07-02 NOTE — Progress Notes (Signed)
Orthopedic Tech Progress Note Patient Details:  Monica RumpsBrittany L Putz 05-Mar-1986 [id number]  Ortho Devices Type of Ortho Device: CAM walker, Crutches Ortho Device/Splint Location: rle Ortho Device/Splint Interventions: Application   Clotilda Hafer 07/02/2014, 9:24 AM

## 2014-07-02 NOTE — ED Notes (Signed)
Patient cursing about "this thing don't fit right".   Patient was asked if we need to try and fix.  Patient adjusted herself.   Patient asked for 4 or 5 peri pads, "I don't have any at home".
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
## 2014-07-02 NOTE — ED Notes (Signed)
Pt & Pt's Boyfriend Requested Pt not have any visitors

## 2014-07-02 NOTE — Discharge Instructions (Signed)
Be sure to keep your splint on when you are moving around.  It may be removed when lying on the couch to apply ice 3-4 times a day. Do not apply weight to your right foot. Use crutches to walk around.  Be sure to call to schedule follow up appointment with Dr. Victorino DikeHewitt, orthopedics for further evaluation and treatment of ankle fracture. See below for further instructions.
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
## 2014-09-08 ENCOUNTER — Encounter (HOSPITAL_COMMUNITY): Payer: Self-pay | Admitting: *Deleted

## 2014-09-08 ENCOUNTER — Emergency Department (HOSPITAL_COMMUNITY)
Admission: EM | Admit: 2014-09-08 | Discharge: 2014-09-08 | Discharge status: Against Medical Advice | Payer: No Typology Code available for payment source | Attending: Emergency Medicine | Admitting: Emergency Medicine

## 2014-09-08 DIAGNOSIS — J45909 Unspecified asthma, uncomplicated: Secondary | ICD-10-CM | POA: Insufficient documentation

## 2014-09-08 DIAGNOSIS — Z791 Long term (current) use of non-steroidal anti-inflammatories (NSAID): Secondary | ICD-10-CM | POA: Insufficient documentation

## 2014-09-08 DIAGNOSIS — Z720 Tobacco use: Secondary | ICD-10-CM | POA: Insufficient documentation

## 2014-09-08 DIAGNOSIS — S40211A Abrasion of right shoulder, initial encounter: Secondary | ICD-10-CM | POA: Insufficient documentation

## 2014-09-08 DIAGNOSIS — Z8659 Personal history of other mental and behavioral disorders: Secondary | ICD-10-CM | POA: Insufficient documentation

## 2014-09-08 DIAGNOSIS — Z792 Long term (current) use of antibiotics: Secondary | ICD-10-CM | POA: Insufficient documentation

## 2014-09-08 DIAGNOSIS — Y998 Other external cause status: Secondary | ICD-10-CM | POA: Insufficient documentation

## 2014-09-08 DIAGNOSIS — S3991XA Unspecified injury of abdomen, initial encounter: Secondary | ICD-10-CM | POA: Insufficient documentation

## 2014-09-08 DIAGNOSIS — Z79899 Other long term (current) drug therapy: Secondary | ICD-10-CM | POA: Insufficient documentation

## 2014-09-08 DIAGNOSIS — Y9289 Other specified places as the place of occurrence of the external cause: Secondary | ICD-10-CM | POA: Insufficient documentation

## 2014-09-08 DIAGNOSIS — T7421XA Adult sexual abuse, confirmed, initial encounter: Secondary | ICD-10-CM | POA: Insufficient documentation

## 2014-09-08 DIAGNOSIS — S0011XA Contusion of right eyelid and periocular area, initial encounter: Secondary | ICD-10-CM | POA: Insufficient documentation

## 2014-09-08 DIAGNOSIS — Y9389 Activity, other specified: Secondary | ICD-10-CM | POA: Insufficient documentation
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
## 2014-09-08 NOTE — ED Notes (Signed)
Spoke with SANE RN who reports patient wanted to rest before completing the sane assessment.  MD is now aware of same.  Patient is sleeping at this time.  No one is at bedside

## 2014-09-08 NOTE — ED Notes (Signed)
Pt stated she left belongings in room.  Only belongings located by staff were a white pillow case with pt undergarments.  Staff immediately took belongings to waiting room, but pt had already left.  Belongings placed in plastic bag, labeled and placed in dirty utilities room.
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
## 2014-10-14 ENCOUNTER — Encounter (HOSPITAL_COMMUNITY): Payer: Self-pay | Admitting: Emergency Medicine

## 2014-10-14 ENCOUNTER — Emergency Department (HOSPITAL_COMMUNITY)
Admission: EM | Admit: 2014-10-14 | Discharge: 2014-10-15 | Discharge status: Against Medical Advice | Payer: No Typology Code available for payment source | Attending: Emergency Medicine | Admitting: Emergency Medicine

## 2014-10-14 DIAGNOSIS — Z3A00 Weeks of gestation of pregnancy not specified: Secondary | ICD-10-CM | POA: Insufficient documentation

## 2014-10-14 DIAGNOSIS — Z792 Long term (current) use of antibiotics: Secondary | ICD-10-CM | POA: Insufficient documentation

## 2014-10-14 DIAGNOSIS — J45901 Unspecified asthma with (acute) exacerbation: Secondary | ICD-10-CM | POA: Insufficient documentation

## 2014-10-14 DIAGNOSIS — Z3A15 15 weeks gestation of pregnancy: Secondary | ICD-10-CM | POA: Insufficient documentation

## 2014-10-14 DIAGNOSIS — R000 Tachycardia, unspecified: Secondary | ICD-10-CM | POA: Insufficient documentation

## 2014-10-14 DIAGNOSIS — R55 Syncope and collapse: Secondary | ICD-10-CM

## 2014-10-14 DIAGNOSIS — O9989 Other specified diseases and conditions complicating pregnancy, childbirth and the puerperium: Secondary | ICD-10-CM | POA: Insufficient documentation

## 2014-10-14 DIAGNOSIS — Z3490 Encounter for supervision of normal pregnancy, unspecified, unspecified trimester: Secondary | ICD-10-CM

## 2014-10-14 DIAGNOSIS — F17219 Nicotine dependence, cigarettes, with unspecified nicotine-induced disorders: Secondary | ICD-10-CM | POA: Insufficient documentation

## 2014-10-14 DIAGNOSIS — O99332 Smoking (tobacco) complicating pregnancy, second trimester: Secondary | ICD-10-CM | POA: Insufficient documentation

## 2014-10-14 DIAGNOSIS — O99512 Diseases of the respiratory system complicating pregnancy, second trimester: Secondary | ICD-10-CM | POA: Insufficient documentation

## 2014-10-14 DIAGNOSIS — Z79899 Other long term (current) drug therapy: Secondary | ICD-10-CM | POA: Insufficient documentation

## 2014-10-14 LAB — POC URINE PREG, ED: Preg Test, Ur: POSITIVE — AB

## 2014-10-14 NOTE — ED Notes (Signed)
Warm blankets given.
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
## 2014-10-14 NOTE — ED Notes (Signed)
Pt. Has requested to eat food.  Explained to pt. That she needs to wait until she is seen by the MD>  She verbalized understanding

## 2014-10-14 NOTE — ED Notes (Signed)
Monica AntuJamie Wright walked pt. To the bathroom, and she never returned to her room,  Unable to find the pt.

## 2014-10-15 ENCOUNTER — Inpatient Hospital Stay (HOSPITAL_COMMUNITY): Payer: No Typology Code available for payment source

## 2014-10-15 ENCOUNTER — Encounter (HOSPITAL_COMMUNITY): Payer: Self-pay | Admitting: *Deleted

## 2014-10-15 ENCOUNTER — Inpatient Hospital Stay (HOSPITAL_COMMUNITY)
Admission: AD | Admit: 2014-10-15 | Discharge: 2014-10-15 | Disposition: A | Discharge status: Home / Self Care | Payer: Self-pay | Source: Ambulatory Visit | Attending: Family Medicine | Admitting: Family Medicine

## 2014-10-15 DIAGNOSIS — N3001 Acute cystitis with hematuria: Secondary | ICD-10-CM

## 2014-10-15 DIAGNOSIS — Z3A01 Less than 8 weeks gestation of pregnancy: Secondary | ICD-10-CM | POA: Insufficient documentation

## 2014-10-15 DIAGNOSIS — F17210 Nicotine dependence, cigarettes, uncomplicated: Secondary | ICD-10-CM | POA: Insufficient documentation

## 2014-10-15 DIAGNOSIS — F1290 Cannabis use, unspecified, uncomplicated: Secondary | ICD-10-CM | POA: Insufficient documentation

## 2014-10-15 DIAGNOSIS — O468X1 Other antepartum hemorrhage, first trimester: Secondary | ICD-10-CM

## 2014-10-15 DIAGNOSIS — F1490 Cocaine use, unspecified, uncomplicated: Secondary | ICD-10-CM | POA: Insufficient documentation

## 2014-10-15 DIAGNOSIS — O208 Other hemorrhage in early pregnancy: Secondary | ICD-10-CM | POA: Insufficient documentation

## 2014-10-15 DIAGNOSIS — O99331 Smoking (tobacco) complicating pregnancy, first trimester: Secondary | ICD-10-CM | POA: Insufficient documentation

## 2014-10-15 DIAGNOSIS — O418X10 Other specified disorders of amniotic fluid and membranes, first trimester, not applicable or unspecified: Secondary | ICD-10-CM

## 2014-10-15 DIAGNOSIS — O99321 Drug use complicating pregnancy, first trimester: Secondary | ICD-10-CM | POA: Insufficient documentation

## 2014-10-15 DIAGNOSIS — R109 Unspecified abdominal pain: Secondary | ICD-10-CM

## 2014-10-15 DIAGNOSIS — O2341 Unspecified infection of urinary tract in pregnancy, first trimester: Secondary | ICD-10-CM | POA: Insufficient documentation

## 2014-10-15 DIAGNOSIS — O26899 Other specified pregnancy related conditions, unspecified trimester: Secondary | ICD-10-CM

## 2014-10-15 LAB — CBC WITH DIFFERENTIAL/PLATELET
BASOS ABS: 0.1 10*3/uL (ref 0.0–0.1)
Basophils Relative: 1 % (ref 0–1)
Eosinophils Absolute: 0.1 10*3/uL (ref 0.0–0.7)
Eosinophils Relative: 2 % (ref 0–5)
HEMATOCRIT: 33.7 % — AB (ref 36.0–46.0)
HEMOGLOBIN: 11.6 g/dL — AB (ref 12.0–15.0)
Lymphocytes Relative: 25 % (ref 12–46)
Lymphs Abs: 1.9 10*3/uL (ref 0.7–4.0)
MCH: 33.6 pg (ref 26.0–34.0)
MCHC: 34.4 g/dL (ref 30.0–36.0)
MCV: 97.7 fL (ref 78.0–100.0)
MONO ABS: 0.8 10*3/uL (ref 0.1–1.0)
MONOS PCT: 11 % (ref 3–12)
NEUTROS ABS: 4.7 10*3/uL (ref 1.7–7.7)
Neutrophils Relative %: 61 % (ref 43–77)
Platelets: 271 10*3/uL (ref 150–400)
RBC: 3.45 MIL/uL — ABNORMAL LOW (ref 3.87–5.11)
RDW: 12.5 % (ref 11.5–15.5)
WBC: 7.6 10*3/uL (ref 4.0–10.5)

## 2014-10-15 LAB — URINALYSIS, ROUTINE W REFLEX MICROSCOPIC
Bilirubin Urine: NEGATIVE
GLUCOSE, UA: NEGATIVE mg/dL
Ketones, ur: NEGATIVE mg/dL
Nitrite: NEGATIVE
Protein, ur: NEGATIVE mg/dL
Specific Gravity, Urine: 1.025 (ref 1.005–1.030)
Urobilinogen, UA: 0.2 mg/dL (ref 0.0–1.0)
pH: 6 (ref 5.0–8.0)

## 2014-10-15 LAB — RAPID URINE DRUG SCREEN, HOSP PERFORMED
AMPHETAMINES: NOT DETECTED
Barbiturates: NOT DETECTED
Benzodiazepines: NOT DETECTED
Cocaine: POSITIVE — AB
Opiates: NOT DETECTED
Tetrahydrocannabinol: POSITIVE — AB

## 2014-10-15 LAB — ABO/RH: ABO/RH(D): B POS

## 2014-10-15 LAB — WET PREP, GENITAL
TRICH WET PREP: NONE SEEN
YEAST WET PREP: NONE SEEN

## 2014-10-15 LAB — GC/CHLAMYDIA PROBE AMP (~~LOC~~) NOT AT ARMC
CHLAMYDIA, DNA PROBE: NEGATIVE
Neisseria Gonorrhea: POSITIVE — AB

## 2014-10-15 LAB — URINE MICROSCOPIC-ADD ON

## 2014-10-15 LAB — RPR: RPR: NONREACTIVE

## 2014-10-15 LAB — HCG, QUANTITATIVE, PREGNANCY: hCG, Beta Chain, Quant, S: 6034 m[IU]/mL — ABNORMAL HIGH (ref <5)

## 2014-10-15 LAB — HIV ANTIBODY (ROUTINE TESTING W REFLEX): HIV Screen 4th Generation wRfx: NONREACTIVE

## 2014-10-15 MED ORDER — CEPHALEXIN 500 MG PO CAPS: 500.0000 mg | ORAL_CAPSULE | Freq: Four times a day (QID) | ORAL | Status: DC

## 2014-10-15 NOTE — Discharge Instructions (Signed)
Pelvic Rest °Pelvic rest is sometimes recommended for women when:  °· The placenta is partially or completely covering the opening of the cervix (placenta previa). °· There is bleeding between the uterine wall and the amniotic sac in the first trimester (subchorionic hemorrhage). °· The cervix begins to open without labor starting (incompetent cervix, cervical insufficiency). °· The labor is too early (preterm labor). °HOME CARE INSTRUCTIONS °· Do not have sexual intercourse, stimulation, or an orgasm. °· Do not use tampons, douche, or put anything in the vagina. °· Do not lift anything over 10 pounds (4.5 kg). °· Avoid strenuous activity or straining your pelvic muscles. °SEEK MEDICAL CARE IF:  °· You have any vaginal bleeding during pregnancy. Treat this as a potential emergency. °· You have cramping pain felt low in the stomach (stronger than menstrual cramps). °· You notice vaginal discharge (watery, mucus, or bloody). °· You have a low, dull backache. °· There are regular contractions or uterine tightening. °SEEK IMMEDIATE MEDICAL CARE IF: °You have vaginal bleeding and have placenta previa.  °Document Released: 10/08/2010 Document Revised: 09/05/2011 Document Reviewed: 10/08/2010 °ExitCare® Patient Information ©2015 [employer]. This information is not intended to replace advice given to you by your health care provider. Make sure you discuss any questions you have with your health care provider. ° °Subchorionic Hematoma °A subchorionic hematoma is a gathering of blood between the outer wall of the placenta and the inner wall of the womb (uterus). The placenta is the organ that connects the fetus to the wall of the uterus. The placenta performs the feeding, breathing (oxygen to the fetus), and waste removal (excretory work) of the fetus.  °Subchorionic hematoma is the most common abnormality found on a result from ultrasonography done during the first trimester or early second trimester of pregnancy. If  there has been little or no vaginal bleeding, early small hematomas usually shrink on their own and do not affect your baby or pregnancy. The blood is gradually absorbed over 1-2 weeks. When bleeding starts later in pregnancy or the hematoma is larger or occurs in an older pregnant woman, the outcome may not be as good. Larger hematomas may get bigger, which increases the chances for miscarriage. Subchorionic hematoma also increases the risk of premature detachment of the placenta from the uterus, preterm (premature) labor, and stillbirth. °HOME CARE INSTRUCTIONS °· Stay on bed rest if your health care provider recommends this. Although bed rest will not prevent more bleeding or prevent a miscarriage, your health care provider may recommend bed rest until you are advised otherwise. °· Avoid heavy lifting (more than 10 lb [4.5 kg]), exercise, sexual intercourse, or douching as directed by your health care provider. °· Keep track of the number of pads you use each day and how soaked (saturated) they are. Write down this information. °· Do not use tampons. °· Keep all follow-up appointments as directed by your health care provider. Your health care provider may ask you to have follow-up blood tests or ultrasound tests or both. °SEEK IMMEDIATE MEDICAL CARE IF: °· You have severe cramps in your stomach, back, abdomen, or pelvis. °· You have a fever. °· You pass large clots or tissue. Save any tissue for your health care provider to look at. °· Your bleeding increases or you become lightheaded, feel weak, or have fainting episodes. °Document Released: 09/28/2006 Document Revised: 10/28/2013 Document Reviewed: 01/10/2013 °ExitCare® Patient Information ©2015 [employer]. This information is not intended to replace advice given to you by your health care provider.   sure you discuss any questions you have with your health care provider. First Trimester of Pregnancy The first trimester of pregnancy is from week 1 until the  end of week 12 (months 1 through 3). During this time, your baby will begin to develop inside you. At 6-8 weeks, the eyes and face are formed, and the heartbeat can be seen on ultrasound. At the end of 12 weeks, all the baby's organs are formed. Prenatal care is all the medical care you receive before the birth of your baby. Make sure you get good prenatal care and follow all of your doctor's instructions. HOME CARE  Medicines  Take medicine only as told by your doctor. Some medicines are safe and some are not during pregnancy.  Take your prenatal vitamins as told by your doctor.  Take medicine that helps you poop (stool softener) as needed if your doctor says it is okay. Diet  Eat regular, healthy meals.  Your doctor will tell you the amount of weight gain that is right for you.  Avoid raw meat and uncooked cheese.  If you feel sick to your stomach (nauseous) or throw up (vomit):  Eat 4 or 5 small meals a day instead of 3 large meals.  Try eating a few soda crackers.  Drink liquids between meals instead of during meals.  If you have a hard time pooping (constipation):  Eat high-fiber foods like fresh vegetables, fruit, and whole grains.  Drink enough fluids to keep your pee (urine) clear or pale yellow. Activity and Exercise  Exercise only as told by your doctor. Stop exercising if you have cramps or pain in your lower belly (abdomen) or low back.  Try to avoid standing for long periods of time. Move your legs often if you must stand in one place for a long time.  Avoid heavy lifting.  Wear low-heeled shoes. Sit and stand up straight.  You can have sex unless your doctor tells you not to. Relief of Pain or Discomfort  Wear a good support bra if your breasts are sore.  Take warm water baths (sitz baths) to soothe pain or discomfort caused by hemorrhoids. Use hemorrhoid cream if your doctor says it is okay.  Rest with your legs raised if you have leg cramps or low back  pain.  Wear support hose if you have puffy, bulging veins (varicose veins) in your legs. Raise (elevate) your feet for 15 minutes, 3-4 times a day. Limit salt in your diet. Prenatal Care  Schedule your prenatal visits by the twelfth week of pregnancy.  Write down your questions. Take them to your prenatal visits.  Keep all your prenatal visits as told by your doctor. Safety  Wear your seat belt at all times when driving.  Make a list of emergency phone numbers. The list should include numbers for family, friends, the hospital, and police and fire departments. General Tips  Ask your doctor for a referral to a local prenatal class. Begin classes no later than at the start of month 6 of your pregnancy.  Ask for help if you need counseling or help with nutrition. Your doctor can give you advice or tell you where to go for help.  Do not use hot tubs, steam rooms, or saunas.  Do not douche or use tampons or scented sanitary pads.  Do not cross your legs for long periods of time.  Avoid litter boxes and soil used by cats.  Avoid all smoking, herbs, and alcohol. Avoid drugs not  approved by your doctor.  Visit your dentist. At home, brush your teeth with a soft toothbrush. Be gentle when you floss. GET HELP IF:  You are dizzy.  You have mild cramps or pressure in your lower belly.  You have a nagging pain in your belly area.  You continue to feel sick to your stomach, throw up, or have watery poop (diarrhea).  You have a bad smelling fluid coming from your vagina.  You have pain with peeing (urination).  You have increased puffiness (swelling) in your face, hands, legs, or ankles. GET HELP RIGHT AWAY IF:   You have a fever.  You are leaking fluid from your vagina.  You have spotting or bleeding from your vagina.  You have very bad belly cramping or pain.  You gain or lose weight rapidly.  You throw up blood. It may look like coffee grounds.  You are around people  who have [place] measles, fifth disease, or chickenpox.  You have a very bad headache.  You have shortness of breath.  You have any kind of trauma, such as from a fall or a car accident. Document Released: 11/30/2007 Document Revised: 10/28/2013 Document Reviewed: 04/23/2013 [facility] Patient Information 2015 [place], [place]. This information is not intended to replace advice given to you by your health care provider. Make sure you discuss any questions you have with your health care provider.

## 2014-10-15 NOTE — MAU Note (Signed)
Pt states she was told that she was pregnant over the phone.Pt states she left the hospital. [facility]

## 2014-10-15 NOTE — MAU Provider Note (Signed)
History     CSN: [id number]  Arrival date and time: 10/15/14 0223   First Provider Initiated Contact with Patient 10/15/14 0257      Chief Complaint  Patient presents with  . Abdominal Pain   HPI  Ms. Monica Holloway is a 29 y.o. G1P0 at Unknown who presents to MAU today with complaint of abdominal pain. She states pain started 2 hours ago. Pain is sharp and bilateral lower abdomen. She rates her pain at 7/10 now. She states +UPT at [facility] yesterday. Per provider note patient left urine sample, but did not stay for evaluation. She denies vaginal bleeding, discharge, UTI symptoms or fever. She has had some nausea and one episode of vomiting.   OB History    Gravida Para Term Preterm AB TAB SAB Ectopic Multiple Living   1 0              Past Medical History  Diagnosis Date  . Asthma   . Panic disorder   . Panic attacks     Past Surgical History  Procedure Laterality Date  . No past surgeries      Family History  Problem Relation Age of Onset  . Cancer Mother   . Diabetes Father     History  Substance Use Topics  . Smoking status: Current Every Day Smoker -- 1.00 packs/day    Types: Cigarettes  . Smokeless tobacco: Never Used  . Alcohol Use: Yes     Comment: occaisional     Allergies: No Known Allergies  Prescriptions prior to admission  Medication Sig Dispense Refill Last Dose  . amoxicillin (AMOXIL) 500 MG capsule Take 1 capsule (500 mg total) by mouth 3 (three) times daily. (Patient not taking: Reported on 05/14/2014) 21 capsule 0 Not Taking at Unknown time  . famotidine (PEPCID) 20 MG tablet Take 1 tablet (20 mg total) by mouth 2 (two) times daily. (Patient not taking: Reported on 07/02/2014) 30 tablet 0 Not Taking at Unknown time  . HYDROcodone-acetaminophen (NORCO/VICODIN) 5-325 MG per tablet Take 1 tablet by mouth every 6 (six) hours as needed for moderate pain or severe pain. (Patient not taking: Reported on 09/08/2014) 15 tablet 0 Not Taking at Unknown time   . ibuprofen (ADVIL,MOTRIN) 800 MG tablet Take 1 tablet (800 mg total) by mouth 3 (three) times daily. (Patient not taking: Reported on 05/14/2014) 21 tablet 0 Not Taking at Unknown time  . lidocaine (XYLOCAINE) 2 % solution Use as directed 20 mLs in the mouth or throat as needed for mouth pain. (Patient not taking: Reported on 05/14/2014) 100 mL 0 Not Taking at Unknown time  . ondansetron (ZOFRAN ODT) 4 MG disintegrating tablet  ODT q4 hours prn nausea/vomiting (Patient not taking: Reported on 05/14/2014) 10 tablet 0 Not Taking at Unknown time  . penicillin v potassium (VEETID) 500 MG tablet Take 1 tablet (500 mg total) by mouth 3 (three) times daily. (Patient not taking: Reported on 07/02/2014) 30 tablet 0 Not Taking at Unknown time    Review of Systems  Constitutional: Negative for fever and malaise/fatigue.  Gastrointestinal: Positive for nausea and abdominal pain. Negative for vomiting, diarrhea and constipation.  Genitourinary: Negative for dysuria, urgency and frequency.       Neg - vaginal bleeding, discharge  Neurological: Negative for dizziness and loss of consciousness.   Physical Exam   Blood pressure 119/67, pulse 84, temperature 98.8 F (37.1 C), temperature source Oral, resp. rate 16, height  (1.575 m), weight 159 lb (  72.122 kg), last menstrual period 08/26/2014.  Physical Exam  Constitutional: She is oriented to person, place, and time. She appears well-developed and well-nourished. No distress.  HENT:  Head: Normocephalic.  Cardiovascular: Normal rate.   Respiratory: Effort normal.  GI: Soft. She exhibits no distension and no mass. There is no tenderness. There is no rebound and no guarding.  Genitourinary: Uterus is tender (mild). Uterus is not enlarged. Cervix exhibits no motion tenderness, no discharge and no friability. Right adnexum displays no mass and no tenderness. Left adnexum displays no mass and no tenderness. No bleeding in the vagina. Vaginal discharge  (scant thin, white, mucous discharge) found.  Neurological: She is alert and oriented to person, place, and time.  Skin: Skin is warm and dry. No erythema.  Psychiatric: She has a normal mood and affect.   Results for orders placed or performed during the hospital encounter of 10/15/14 (from the past 24 hour(s))  Urinalysis, Routine w reflex microscopic     Status: Abnormal   Collection Time: 10/15/14  2:30 AM  Result Value Ref Range   Color, Urine YELLOW YELLOW   APPearance CLEAR CLEAR   Specific Gravity, Urine 1.025 1.005 - 1.030   pH 6.0 5.0 - 8.0   Glucose, UA NEGATIVE NEGATIVE mg/dL   Hgb urine dipstick TRACE (A) NEGATIVE   Bilirubin Urine NEGATIVE NEGATIVE   Ketones, ur NEGATIVE NEGATIVE mg/dL   Protein, ur NEGATIVE NEGATIVE mg/dL   Urobilinogen, UA 0.2 0.0 - 1.0 mg/dL   Nitrite NEGATIVE NEGATIVE   Leukocytes, UA MODERATE (A) NEGATIVE  Urine microscopic-add on     Status: Abnormal   Collection Time: 10/15/14  2:30 AM  Result Value Ref Range   Squamous Epithelial / LPF FEW (A) RARE   WBC, UA 11-20 <3 WBC/hpf   RBC / HPF 3-6 <3 RBC/hpf   Bacteria, UA MANY (A) RARE  Urine rapid drug screen (hosp performed)     Status: Abnormal   Collection Time: 10/15/14  2:30 AM  Result Value Ref Range   Opiates NONE DETECTED NONE DETECTED   Cocaine POSITIVE (A) NONE DETECTED   Benzodiazepines NONE DETECTED NONE DETECTED   Amphetamines NONE DETECTED NONE DETECTED   Tetrahydrocannabinol POSITIVE (A) NONE DETECTED   Barbiturates NONE DETECTED NONE DETECTED  Wet prep, genital     Status: Abnormal   Collection Time: 10/15/14  3:12 AM  Result Value Ref Range   Yeast Wet Prep HPF POC NONE SEEN NONE SEEN   Trich, Wet Prep NONE SEEN NONE SEEN   Clue Cells Wet Prep HPF POC FEW (A) NONE SEEN   WBC, Wet Prep HPF POC FEW (A) NONE SEEN  CBC with Differential/Platelet     Status: Abnormal   Collection Time: 10/15/14  3:26 AM  Result Value Ref Range   WBC 7.6 4.0 - 10.5 K/uL   RBC 3.45 (L) 3.87 -  5.11 MIL/uL   Hemoglobin 11.6 (L) 12.0 - 15.0 g/dL   HCT 16.133.7 (L) 09.636.0 - 04.546.0 %   MCV 97.7 78.0 - 100.0 fL   MCH 33.6 26.0 - 34.0 pg   MCHC 34.4 30.0 - 36.0 g/dL   RDW 40.912.5 81.111.5 - 91.415.5 %   Platelets 271 150 - 400 K/uL   Neutrophils Relative % 61 43 - 77 %   Neutro Abs 4.7 1.7 - 7.7 K/uL   Lymphocytes Relative 25 12 - 46 %   Lymphs Abs 1.9 0.7 - 4.0 K/uL   Monocytes Relative 11 3 - 12 %  Monocytes Absolute 0.8 0.1 - 1.0 K/uL   Eosinophils Relative 2 0 - 5 %   Eosinophils Absolute 0.1 0.0 - 0.7 K/uL   Basophils Relative 1 0 - 1 %   Basophils Absolute 0.1 0.0 - 0.1 K/uL  ABO/Rh     Status: None (Preliminary result)   Collection Time: 10/15/14  3:26 AM  Result Value Ref Range   ABO/RH(D) B POS   hCG, quantitative, pregnancy     Status: Abnormal   Collection Time: 10/15/14  3:26 AM  Result Value Ref Range   hCG, Beta Chain, Quant, S 6034 (H) <5 mIU/mL   US Ob Comp Less 14 Wks  10/15/2014   CLINICAL DATA:  Abdominal pain for 1 day in first-trimester pregnancy  EXAM: OBSTETRIC <14 WK [place] AND TRANSVAGINAL OB US  TECHNIQUE: Both transabdominal and transvaginal ultrasound examinations were performed for complete evaluation of the gestation as well as the maternal uterus, adnexal regions, and pelvic cul-de-sac. Transvaginal technique was performed to assess early pregnancy.  COMPARISON:  None.  FINDINGS: Intrauterine gestational sac: Visualized/normal in shape. A small subchorionic hematoma is noted along the lower portion of the gestational sac, 9 x 3 x 10 mm.  Yolk sac:  Present  Embryo:  Present, amorphous due to small size.  Cardiac Activity: Not seen  MSD: 18.9  mm   6 w   6  d                 [place] EDC: 06/04/2015  CRL:  1.3  mm  Maternal uterus/adnexae: The ovaries are negative. No free pelvic fluid.  IMPRESSION: 1. Intrauterine gestation at 6 weeks 6 days by mean sac diameter. A 1-2 mm embryo is noted, suggest follow-up US in 10-14 days to confirm viability. 2. 10 mm subchorionic hematoma.    Electronically Signed   By: Marnee Spring M.D.   On: 10/15/2014 04:24   US Ob Transvaginal  10/15/2014   CLINICAL DATA:  Abdominal pain for 1 day in first-trimester pregnancy  EXAM: OBSTETRIC <14 WK [place] AND TRANSVAGINAL OB US  TECHNIQUE: Both transabdominal and transvaginal ultrasound examinations were performed for complete evaluation of the gestation as well as the maternal uterus, adnexal regions, and pelvic cul-de-sac. Transvaginal technique was performed to assess early pregnancy.  COMPARISON:  None.  FINDINGS: Intrauterine gestational sac: Visualized/normal in shape. A small subchorionic hematoma is noted along the lower portion of the gestational sac, 9 x 3 x 10 mm.  Yolk sac:  Present  Embryo:  Present, amorphous due to small size.  Cardiac Activity: Not seen  MSD: 18.9  mm   6 w   6  d                 [place] EDC: 06/04/2015  CRL:  1.3  mm  Maternal uterus/adnexae: The ovaries are negative. No free pelvic fluid.  IMPRESSION: 1. Intrauterine gestation at 6 weeks 6 days by mean sac diameter. A 1-2 mm embryo is noted, suggest follow-up US in 10-14 days to confirm viability. 2. 10 mm subchorionic hematoma.   Electronically Signed   By: Marnee Spring M.D.   On: 10/15/2014 04:24    MAU Course  Procedures  MDM +UPT at [facility] yesterday UA, wet prep, GC/Chlamydia, CBC, ABO/Rh, quant hCG, HIV, RPR and [place] today Urine culture ordered Independent review of [place] images Reviewed provider documentation from ED visit yesterday  Assessment and Plan  A: SIUP at [redacted]w[redacted]d without cardiac activity UTI Small subchorionic hemorrhage Cocaine  and THC use Current smoker  P: Discharge home Rx for Keflex given to the patient Urine culture pending Bleeding and first trimester warning signs discussed Pregnancy confirmation letter and list of area OB providers given Patient advised to stop alcohol, cigarette and drug use Patient advised to increase PO hydration as tolerated Patient advised to follow-up with OB  provider of choice for prenatal care Patient may return to MAU as needed or if her condition were to change or worsen   Marny Lowenstein, PA-C  10/15/2014, 4:47 AM

## 2014-10-16 ENCOUNTER — Encounter (HOSPITAL_COMMUNITY): Payer: Self-pay | Admitting: *Deleted

## 2014-10-16 LAB — CULTURE, OB URINE: Colony Count: >=100000

## 2014-10-22 ENCOUNTER — Emergency Department (HOSPITAL_COMMUNITY)
Admission: EM | Admit: 2014-10-22 | Discharge: 2014-10-22 | Disposition: A | Discharge status: Home / Self Care | Payer: No Typology Code available for payment source | Attending: Emergency Medicine | Admitting: Emergency Medicine

## 2014-10-22 ENCOUNTER — Emergency Department (HOSPITAL_COMMUNITY): Payer: No Typology Code available for payment source

## 2014-10-22 ENCOUNTER — Encounter (HOSPITAL_COMMUNITY): Payer: Self-pay | Admitting: *Deleted

## 2014-10-22 DIAGNOSIS — J45909 Unspecified asthma, uncomplicated: Secondary | ICD-10-CM | POA: Insufficient documentation

## 2014-10-22 DIAGNOSIS — Z8659 Personal history of other mental and behavioral disorders: Secondary | ICD-10-CM | POA: Insufficient documentation

## 2014-10-22 DIAGNOSIS — Z3A08 8 weeks gestation of pregnancy: Secondary | ICD-10-CM | POA: Insufficient documentation

## 2014-10-22 DIAGNOSIS — O99511 Diseases of the respiratory system complicating pregnancy, first trimester: Secondary | ICD-10-CM | POA: Insufficient documentation

## 2014-10-22 DIAGNOSIS — O99331 Smoking (tobacco) complicating pregnancy, first trimester: Secondary | ICD-10-CM | POA: Insufficient documentation

## 2014-10-22 DIAGNOSIS — Z792 Long term (current) use of antibiotics: Secondary | ICD-10-CM | POA: Insufficient documentation

## 2014-10-22 DIAGNOSIS — F17210 Nicotine dependence, cigarettes, uncomplicated: Secondary | ICD-10-CM | POA: Insufficient documentation

## 2014-10-22 DIAGNOSIS — Z79899 Other long term (current) drug therapy: Secondary | ICD-10-CM | POA: Insufficient documentation

## 2014-10-22 DIAGNOSIS — O039 Complete or unspecified spontaneous abortion without complication: Secondary | ICD-10-CM | POA: Insufficient documentation

## 2014-10-22 DIAGNOSIS — N939 Abnormal uterine and vaginal bleeding, unspecified: Secondary | ICD-10-CM

## 2014-10-22 LAB — CBC WITH DIFFERENTIAL/PLATELET
BASOS ABS: 0 10*3/uL (ref 0.0–0.1)
BASOS PCT: 0 % (ref 0–1)
Eosinophils Absolute: 0.1 10*3/uL (ref 0.0–0.7)
Eosinophils Relative: 1 % (ref 0–5)
HCT: 35.3 % — ABNORMAL LOW (ref 36.0–46.0)
Hemoglobin: 11.7 g/dL — ABNORMAL LOW (ref 12.0–15.0)
LYMPHS PCT: 13 % (ref 12–46)
Lymphs Abs: 1.8 10*3/uL (ref 0.7–4.0)
MCH: 32.7 pg (ref 26.0–34.0)
MCHC: 33.1 g/dL (ref 30.0–36.0)
MCV: 98.6 fL (ref 78.0–100.0)
Monocytes Absolute: 0.9 10*3/uL (ref 0.1–1.0)
Monocytes Relative: 7 % (ref 3–12)
NEUTROS ABS: 10.7 10*3/uL — AB (ref 1.7–7.7)
NEUTROS PCT: 79 % — AB (ref 43–77)
Platelets: 262 10*3/uL (ref 150–400)
RBC: 3.58 MIL/uL — ABNORMAL LOW (ref 3.87–5.11)
RDW: 12.1 % (ref 11.5–15.5)
WBC: 13.5 10*3/uL — ABNORMAL HIGH (ref 4.0–10.5)

## 2014-10-22 LAB — HCG, QUANTITATIVE, PREGNANCY: HCG, BETA CHAIN, QUANT, S: 1693 m[IU]/mL — AB (ref <5)

## 2014-10-22 LAB — COMPREHENSIVE METABOLIC PANEL
ALT: 20 U/L (ref 0–35)
AST: 24 U/L (ref 0–37)
Albumin: 3.5 g/dL (ref 3.5–5.2)
Alkaline Phosphatase: 45 U/L (ref 39–117)
Anion gap: 8 (ref 5–15)
BILIRUBIN TOTAL: 0.3 mg/dL (ref 0.3–1.2)
BUN: 12 mg/dL (ref 6–23)
CHLORIDE: 103 mmol/L (ref 96–112)
CO2: 23 mmol/L (ref 19–32)
Calcium: 9.1 mg/dL (ref 8.4–10.5)
Creatinine, Ser: 0.79 mg/dL (ref 0.50–1.10)
GFR calc Af Amer: >90 mL/min (ref >90)
Glucose, Bld: 107 mg/dL — ABNORMAL HIGH (ref 70–99)
Potassium: 3.6 mmol/L (ref 3.5–5.1)
SODIUM: 134 mmol/L — AB (ref 135–145)
Total Protein: 6.4 g/dL (ref 6.0–8.3)

## 2014-10-22 NOTE — Discharge Instructions (Signed)
Miscarriage A miscarriage is the sudden loss of an unborn baby (fetus) before the 20th week of pregnancy. Most miscarriages happen in the first 3 months of pregnancy. Sometimes, it happens before a woman even knows she is pregnant. A miscarriage is also called a "spontaneous miscarriage" or "early pregnancy loss." Having a miscarriage can be an emotional experience. Talk with your caregiver about any questions you may have about miscarrying, the grieving process, and your future pregnancy plans. CAUSES   Problems with the fetal chromosomes that make it impossible for the baby to develop normally. Problems with the baby's genes or chromosomes are most often the result of errors that occur, by chance, as the embryo divides and grows. The problems are not inherited from the parents.  Infection of the cervix or uterus.   Hormone problems.   Problems with the cervix, such as having an incompetent cervix. This is when the tissue in the cervix is not strong enough to hold the pregnancy.   Problems with the uterus, such as an abnormally shaped uterus, uterine fibroids, or congenital abnormalities.   Certain medical conditions.   Smoking, drinking alcohol, or taking illegal drugs.   Trauma.  Often, the cause of a miscarriage is unknown.  SYMPTOMS   Vaginal bleeding or spotting, with or without cramps or pain.  Pain or cramping in the abdomen or lower back.  Passing fluid, tissue, or blood clots from the vagina. DIAGNOSIS  Your caregiver will perform a physical exam. You may also have an ultrasound to confirm the miscarriage. Blood or urine tests may also be ordered. TREATMENT   Sometimes, treatment is not necessary if you naturally pass all the fetal tissue that was in the uterus. If some of the fetus or placenta remains in the body (incomplete miscarriage), tissue left behind may become infected and must be removed. Usually, a dilation and curettage (D and C) procedure is performed.  During a D and C procedure, the cervix is widened (dilated) and any remaining fetal or placental tissue is gently removed from the uterus.  Antibiotic medicines are prescribed if there is an infection. Other medicines may be given to reduce the size of the uterus (contract) if there is a lot of bleeding.  If you have Rh negative blood and your baby was Rh positive, you will need a Rh immunoglobulin shot. This shot will protect any future baby from having Rh blood problems in future pregnancies. HOME CARE INSTRUCTIONS   Your caregiver may order bed rest or may allow you to continue light activity. Resume activity as directed by your caregiver.  Have someone help with home and family responsibilities during this time.   Keep track of the number of sanitary pads you use each day and how soaked (saturated) they are. Write down this information.   Do not use tampons. Do not douche or have sexual intercourse until approved by your caregiver.   Only take over-the-counter or prescription medicines for pain or discomfort as directed by your caregiver.   Do not take aspirin. Aspirin can cause bleeding.   Keep all follow-up appointments with your caregiver.   If you or your partner have problems with grieving, talk to your caregiver or seek counseling to help cope with the pregnancy loss. Allow enough time to grieve before trying to get pregnant again.  SEEK IMMEDIATE MEDICAL CARE IF:   You have severe cramps or pain in your back or abdomen.  You have a fever.  You pass large blood clots (walnut-sized   or larger) ortissue from your vagina. Save any tissue for your caregiver to inspect.   Your bleeding increases.   You have a thick, bad-smelling vaginal discharge.  You become lightheaded, weak, or you faint.   You have chills.  MAKE SURE YOU:  Understand these instructions.  Will watch your condition.  Will get help right away if you are not doing well or get  worse. Document Released: 12/07/2000 Document Revised: 10/08/2012 Document Reviewed: 08/02/2011 Exit[employer] Patient Information 2015 [employer]. This information is not intended to replace advice given to you by your health care provider. Make sure you discuss any questions you have with your health care provider.  

## 2014-10-22 NOTE — ED Notes (Signed)
Pt in c/o heavy vaginal bleeding, states she is approx 6-[redacted] weeks pregnant, pt has bleed through onto her clothing, c/o severe abdominal cramping, appears in pain

## 2014-10-22 NOTE — ED Notes (Signed)
Family at bedside. 

## 2014-10-22 NOTE — ED Notes (Signed)
Patient is alert and orientedx4.  Patient was explained discharge instructions and they understood them with no questions.  The patient's father, Corlis HoveDavid Mcneill is taking the patient home.

## 2014-10-22 NOTE — ED Provider Notes (Signed)
CSN: [id number]     Arrival date & time 10/22/14  0014 History  This chart was scribed for Mirian Mo, MD by Annye Asa, ED Scribe. This patient was seen in room D35C/D35C and the patient's care was started at 1:47 AM.    Chief Complaint  Patient presents with  . Vaginal Bleeding  . Pregnant    Patient is a 29 y.o. female presenting with vaginal bleeding. The history is provided by the patient. No language interpreter was used.  Vaginal Bleeding Quality:  Heavier than menses Severity:  Moderate Onset quality:  Gradual Duration:  6 days Timing:  Constant Progression:  Worsening Chronicity:  New Possible pregnancy: yes   Context: spontaneously   Relieved by:  None tried Worsened by:  Nothing tried Ineffective treatments:  None tried Associated symptoms: abdominal pain   Risk factors: no bleeding disorder, no hx of ectopic pregnancy and no prior miscarriage     HPI Comments: Monica Holloway is an otherwise healthy, [redacted] weeks pregnant 29 y.o. female who presents to the Emergency Department complaining of 6 days of gradually worsening vaginal bleeding with abdominal cramping. Her pain worsens with applied pressure. Patient explains she was originally seen on 10/15/14 and told at that time that she had a "small amount of blood" in her uterus, but was told that this was normal but to return if it worsened.   She is a 1ppd smoker.   Past Medical History  Diagnosis Date  . Asthma   . Panic disorder   . Panic attacks    Past Surgical History  Procedure Laterality Date  . No past surgeries     Family History  Problem Relation Age of Onset  . Cancer Mother   . Diabetes Father    History  Substance Use Topics  . Smoking status: Current Every Day Smoker -- 1.00 packs/day    Types: Cigarettes  . Smokeless tobacco: Never Used  . Alcohol Use: Yes     Comment: occaisional    OB History    Gravida Para Term Preterm AB TAB SAB Ectopic Multiple Living   1 0             Review  of Systems  Gastrointestinal: Positive for abdominal pain.  Genitourinary: Positive for vaginal bleeding.  All other systems reviewed and are negative.  Allergies  Review of patient's allergies indicates no known allergies.  Home Medications   Prior to Admission medications   Medication Sig Start Date End Date Taking? Authorizing Provider  cephALEXin (KEFLEX) 500 MG capsule Take 1 capsule (500 mg total) by mouth 4 (four) times daily. 10/15/14  Yes Marny Lowenstein, PA-C  Multiple Vitamin (MULTIVITAMIN WITH MINERALS) TABS tablet Take 1 tablet by mouth daily.   Yes Historical Provider, MD  Omega-3 Fatty Acids (FISH OIL PO) Take 1 tablet by mouth daily.   Yes Historical Provider, MD   BP 113/64 mmHg  Pulse 88  Temp(Src) 99.1 F (37.3 C) (Oral)  Resp 16  Ht  (1.575 m)  Wt 160 lb 14.4 oz (72.984 kg)  BMI 29.42 kg/m2  SpO2 100%  LMP 08/26/2014 (Approximate) Physical Exam  Constitutional: She is oriented to person, place, and time. She appears well-developed and well-nourished.  HENT:  Head: Normocephalic and atraumatic.  Right Ear: External ear normal.  Left Ear: External ear normal.  Eyes: Conjunctivae and EOM are normal. Pupils are equal, round, and reactive to light.  Neck: Normal range of motion. Neck supple.  Cardiovascular: Normal  rate, regular rhythm, normal heart sounds and intact distal pulses.   Pulmonary/Chest: Effort normal and breath sounds normal.  Abdominal: Soft. Bowel sounds are normal. There is tenderness in the suprapubic area.  Genitourinary: Cervix exhibits discharge (small blood). Cervix exhibits no motion tenderness and no friability. Right adnexum displays no tenderness. Left adnexum displays no tenderness.  Musculoskeletal: Normal range of motion.  Neurological: She is alert and oriented to person, place, and time.  Skin: Skin is warm and dry.  Vitals reviewed.   ED Course  Procedures   DIAGNOSTIC STUDIES: Oxygen Saturation is 100% on RA, normal by  my interpretation.    COORDINATION OF CARE: 1:53 AM Discussed treatment plan with pt at bedside and pt agreed to plan.   Labs Review Labs Reviewed  CBC WITH DIFFERENTIAL/PLATELET - Abnormal; Notable for the following:    WBC 13.5 (*)    RBC 3.58 (*)    Hemoglobin 11.7 (*)    HCT 35.3 (*)    Neutrophils Relative % 79 (*)    Neutro Abs 10.7 (*)    All other components within normal limits  COMPREHENSIVE METABOLIC PANEL - Abnormal; Notable for the following:    Sodium 134 (*)    Glucose, Bld 107 (*)    All other components within normal limits  HCG, QUANTITATIVE, PREGNANCY - Abnormal; Notable for the following:    hCG, Beta Chain, Quant, S 1693 (*)    All other components within normal limits    Imaging Review No results found.   EKG Interpretation None     EMERGENCY DEPARTMENT US PREGNANCY "Study: Limited Ultrasound of the Pelvis for Pregnancy"  INDICATIONS:Pregnancy(required) Multiple views of the uterus and pelvic cavity were obtained in real-time with a multi-frequency probe.  APPROACH:Transabdominal   PERFORMED BY: Myself  IMAGES ARCHIVED?: Yes  LIMITATIONS: Body habitus  PREGNANCY FREE FLUID: None  ADNEXAL FINDINGS:none  PREGNANCY FINDINGS: fluid collection within uterus, no visualized yolk sac or fetal pole  INTERPRETATION: No visualized intrauterine pregnancy  GESTATIONAL AGE, ESTIMATE:   FETAL HEART RATE:   CPT Codes:  81191-4776815-26 (transabdominal OB)  768-26-52 (transvaginal OB, Reduced level of service for incomplete exam)     MDM   Final diagnoses:  Vaginal bleeding  Miscarriage    29 y.o. female with pertinent PMH of current gravid status at ~6-7 weeks, cocaine use presents with vaginal bleeding and abd cramping as above.  Physical exam and US concerning for ongoing abortion.  Obtained confirmatory US as formal which verified no IUP, with present US in system with + pregnancy, indicating ongoing ab.  Pt Rh+.  Discussed results.  DC home to  fu with OB.    I have reviewed all laboratory and imaging studies if ordered as above  1. Miscarriage   2. Vaginal bleeding           Mirian MoMatthew Carollee Nussbaumer, MD 10/24/14 2328

## 2014-10-22 NOTE — ED Notes (Signed)
Patient transported to Ultrasound 

## 2014-11-16 ENCOUNTER — Emergency Department (HOSPITAL_COMMUNITY)
Admission: EM | Admit: 2014-11-16 | Discharge: 2014-11-16 | Disposition: A | Discharge status: Home / Self Care | Payer: Self-pay | Attending: Emergency Medicine | Admitting: Emergency Medicine

## 2014-11-16 ENCOUNTER — Emergency Department (HOSPITAL_COMMUNITY): Payer: Self-pay

## 2014-11-16 ENCOUNTER — Encounter (HOSPITAL_COMMUNITY): Payer: Self-pay | Admitting: Emergency Medicine

## 2014-11-16 DIAGNOSIS — Z720 Tobacco use: Secondary | ICD-10-CM | POA: Insufficient documentation

## 2014-11-16 DIAGNOSIS — R079 Chest pain, unspecified: Secondary | ICD-10-CM | POA: Insufficient documentation

## 2014-11-16 DIAGNOSIS — Z792 Long term (current) use of antibiotics: Secondary | ICD-10-CM | POA: Insufficient documentation

## 2014-11-16 DIAGNOSIS — R000 Tachycardia, unspecified: Secondary | ICD-10-CM | POA: Insufficient documentation

## 2014-11-16 DIAGNOSIS — Z8659 Personal history of other mental and behavioral disorders: Secondary | ICD-10-CM | POA: Insufficient documentation

## 2014-11-16 DIAGNOSIS — J45909 Unspecified asthma, uncomplicated: Secondary | ICD-10-CM | POA: Insufficient documentation

## 2014-11-16 LAB — BASIC METABOLIC PANEL
ANION GAP: 13 (ref 5–15)
BUN: 6 mg/dL (ref 6–20)
CO2: 23 mmol/L (ref 22–32)
Calcium: 9.7 mg/dL (ref 8.9–10.3)
Chloride: 103 mmol/L (ref 101–111)
Creatinine, Ser: 0.93 mg/dL (ref 0.44–1.00)
GFR calc non Af Amer: >60 mL/min (ref >60)
Glucose, Bld: 82 mg/dL (ref 65–99)
Potassium: 3.9 mmol/L (ref 3.5–5.1)
Sodium: 139 mmol/L (ref 135–145)

## 2014-11-16 LAB — CBC
HEMATOCRIT: 44.2 % (ref 36.0–46.0)
Hemoglobin: 15.2 g/dL — ABNORMAL HIGH (ref 12.0–15.0)
MCH: 33.8 pg (ref 26.0–34.0)
MCHC: 34.4 g/dL (ref 30.0–36.0)
MCV: 98.2 fL (ref 78.0–100.0)
Platelets: 249 10*3/uL (ref 150–400)
RBC: 4.5 MIL/uL (ref 3.87–5.11)
RDW: 12.3 % (ref 11.5–15.5)
WBC: 8.7 10*3/uL (ref 4.0–10.5)

## 2014-11-16 MED ORDER — ONDANSETRON 4 MG PO TBDP
8.0000 mg | ORAL_TABLET | Freq: Once | ORAL | Status: AC
  Administered 2014-11-16: 8 mg via ORAL
  Filled 2014-11-16: qty 2

## 2014-11-16 MED ORDER — ONDANSETRON HCL 4 MG/2ML IJ SOLN
4.0000 mg | Freq: Once | INTRAMUSCULAR | Status: AC
  Administered 2014-11-16: 4 mg via INTRAVENOUS
  Filled 2014-11-16: qty 2

## 2014-11-16 MED ORDER — GI COCKTAIL ~~LOC~~
30.0000 mL | Freq: Once | ORAL | Status: AC
  Administered 2014-11-16: 30 mL via ORAL
  Filled 2014-11-16: qty 30

## 2014-11-16 MED ORDER — LORAZEPAM 2 MG/ML IJ SOLN
1.0000 mg | Freq: Once | INTRAMUSCULAR | Status: AC
  Administered 2014-11-16: 1 mg via INTRAVENOUS
  Filled 2014-11-16: qty 1

## 2014-11-16 MED ORDER — SODIUM CHLORIDE 0.9 % IV BOLUS (SEPSIS)
1000.0000 mL | Freq: Once | INTRAVENOUS | Status: AC
  Administered 2014-11-16: 1000 mL via INTRAVENOUS

## 2014-11-16 MED ORDER — OMEPRAZOLE 40 MG PO CPDR: 40.0000 mg | DELAYED_RELEASE_CAPSULE | Freq: Every day | ORAL | Status: DC

## 2014-11-16 MED ORDER — PROMETHAZINE HCL 25 MG PO TABS: 25.0000 mg | ORAL_TABLET | Freq: Four times a day (QID) | ORAL | Status: DC | PRN
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
## 2014-11-16 NOTE — ED Notes (Signed)
Ward, DO at bedside.  

## 2014-11-16 NOTE — ED Notes (Signed)
This RN attempted to start an IV and draw blood, however the was unable to. Phlebotomy notified that a blood draw is needed.

## 2014-11-16 NOTE — Discharge Instructions (Signed)
Chest Pain (Nonspecific) °It is often hard to give a specific diagnosis for the cause of chest pain. There is always a chance that your pain could be related to something serious, such as a heart attack or a blood clot in the lungs. You need to follow up with your health care provider for further evaluation. °CAUSES  °· Heartburn. °· Pneumonia or bronchitis. °· Anxiety or stress. °· Inflammation around your heart (pericarditis) or lung (pleuritis or pleurisy). °· A blood clot in the lung. °· A collapsed lung (pneumothorax). It can develop suddenly on its own (spontaneous pneumothorax) or from trauma to the chest. °· Shingles infection (herpes zoster virus). °The chest wall is composed of bones, muscles, and cartilage. Any of these can be the source of the pain. °· The bones can be bruised by injury. °· The muscles or cartilage can be strained by coughing or overwork. °· The cartilage can be affected by inflammation and become sore (costochondritis). °DIAGNOSIS  °Lab tests or other studies may be needed to find the cause of your pain. Your health care provider may have you take a test called an ambulatory electrocardiogram (ECG). An ECG records your heartbeat patterns over a 24-hour period. You may also have other tests, such as: °· Transthoracic echocardiogram (TTE). During echocardiography, sound waves are used to evaluate how blood flows through your heart. °· Transesophageal echocardiogram (TEE). °· Cardiac monitoring. This allows your health care provider to monitor your heart rate and rhythm in real time. °· Holter monitor. This is a portable device that records your heartbeat and can help diagnose heart arrhythmias. It allows your health care provider to track your heart activity for several days, if needed. °· Stress tests by exercise or by giving medicine that makes the heart beat faster. °TREATMENT  °· Treatment depends on what may be causing your chest pain. Treatment may include: °· Acid blockers for  heartburn. °· Anti-inflammatory medicine. °· Pain medicine for inflammatory conditions. °· Antibiotics if an infection is present. °· You may be advised to change lifestyle habits. This includes stopping smoking and avoiding alcohol, caffeine, and chocolate. °· You may be advised to keep your head raised (elevated) when sleeping. This reduces the chance of acid going backward from your stomach into your esophagus. °Most of the time, nonspecific chest pain will improve within 2-3 days with rest and mild pain medicine.  °HOME CARE INSTRUCTIONS  °· If antibiotics were prescribed, take them as directed. Finish them even if you start to feel better. °· For the next few days, avoid physical activities that bring on chest pain. Continue physical activities as directed. °· Do not use any tobacco products, including cigarettes, chewing tobacco, or electronic cigarettes. °· Avoid drinking alcohol. °· Only take medicine as directed by your health care provider. °· Follow your health care provider's suggestions for further testing if your chest pain does not go away. °· Keep any follow-up appointments you made. If you do not go to an appointment, you could develop lasting (chronic) problems with pain. If there is any problem keeping an appointment, call to reschedule. °SEEK MEDICAL CARE IF:  °· Your chest pain does not go away, even after treatment. °· You have a rash with blisters on your chest. °· You have a fever. °SEEK IMMEDIATE MEDICAL CARE IF:  °· You have increased chest pain or pain that spreads to your arm, neck, jaw, back, or abdomen. °· You have shortness of breath. °· You have an increasing cough, or you cough   up blood. °· You have severe back or abdominal pain. °· You feel nauseous or vomit. °· You have severe weakness. °· You faint. °· You have chills. °This is an emergency. Do not wait to see if the pain will go away. Get medical help at once. Call your local emergency services (911 in [place] Do not drive  yourself to the hospital. °MAKE SURE YOU:  °· Understand these instructions. °· Will watch your condition. °· Will get help right away if you are not doing well or get worse. °Document Released: 03/23/2005 Document Revised: 06/18/2013 Document Reviewed: 01/17/2008 °ExitCare® Patient Information ©2015 [employer]. This information is not intended to replace advice given to you by your health care provider. Make sure you discuss any questions you have with your health care provider. ° ° ° °Nausea and Vomiting °Nausea is a sick feeling that often comes before throwing up (vomiting). Vomiting is a reflex where stomach contents come out of your mouth. Vomiting can cause severe loss of body fluids (dehydration). Children and elderly adults can become dehydrated quickly, especially if they also have diarrhea. Nausea and vomiting are symptoms of a condition or disease. It is important to find the cause of your symptoms. °CAUSES  °· Direct irritation of the stomach lining. This irritation can result from increased acid production (gastroesophageal reflux disease), infection, food poisoning, taking certain medicines (such as nonsteroidal anti-inflammatory drugs), alcohol use, or tobacco use. °· Signals from the brain. These signals could be caused by a headache, heat exposure, an inner ear disturbance, increased pressure in the brain from injury, infection, a tumor, or a concussion, pain, emotional stimulus, or metabolic problems. °· An obstruction in the gastrointestinal tract (bowel obstruction). °· Illnesses such as diabetes, hepatitis, gallbladder problems, appendicitis, kidney problems, cancer, sepsis, atypical symptoms of a heart attack, or eating disorders. °· Medical treatments such as chemotherapy and radiation. °· Receiving medicine that makes you sleep (general anesthetic) during surgery. °DIAGNOSIS °Your caregiver may ask for tests to be done if the problems do not improve after a few days. Tests may also be  done if symptoms are severe or if the reason for the nausea and vomiting is not clear. Tests may include: °· Urine tests. °· Blood tests. °· Stool tests. °· Cultures (to look for evidence of infection). °· X-rays or other imaging studies. °Test results can help your caregiver make decisions about treatment or the need for additional tests. °TREATMENT °You need to stay well hydrated. Drink frequently but in small amounts. You may wish to drink water, sports drinks, clear broth, or eat frozen ice pops or gelatin dessert to help stay hydrated. When you eat, eating slowly may help prevent nausea. There are also some antinausea medicines that may help prevent nausea. °HOME CARE INSTRUCTIONS  °· Take all medicine as directed by your caregiver. °· If you do not have an appetite, do not force yourself to eat. However, you must continue to drink fluids. °· If you have an appetite, eat a normal diet unless your caregiver tells you differently. °¨ Eat a variety of complex carbohydrates (rice, wheat, potatoes, bread), lean meats, yogurt, fruits, and vegetables. °¨ Avoid high-fat foods because they are more difficult to digest. °· Drink enough water and fluids to keep your urine clear or pale yellow. °· If you are dehydrated, ask your caregiver for specific rehydration instructions. Signs of dehydration may include: °¨ Severe thirst. °¨ Dry lips and mouth. °¨ Dizziness. °¨ Dark urine. °¨ Decreasing urine frequency and amount. °¨ Confusion. °¨ Rapid   breathing or pulse. °SEEK IMMEDIATE MEDICAL CARE IF:  °· You have blood or brown flecks (like coffee grounds) in your vomit. °· You have black or bloody stools. °· You have a severe headache or stiff neck. °· You are confused. °· You have severe abdominal pain. °· You have chest pain or trouble breathing. °· You do not urinate at least once every 8 hours. °· You develop cold or clammy skin. °· You continue to vomit for longer than 24 to 48 hours. °· You have a fever. °MAKE SURE YOU:    °· Understand these instructions. °· Will watch your condition. °· Will get help right away if you are not doing well or get worse. °Document Released: 06/13/2005 Document Revised: 09/05/2011 Document Reviewed: 11/10/2010 °ExitCare® Patient Information ©2015 [employer]. This information is not intended to replace advice given to you by your health care provider. Make sure you discuss any questions you have with your health care provider. ° °
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
## 2014-11-17 LAB — I-STAT TROPONIN, ED: TROPONIN I, POC: 0 ng/mL (ref 0.00–0.08)

## 2014-11-17 LAB — I-STAT CHEM 8, ED
BUN: 6 mg/dL (ref 6–20)
CALCIUM ION: 1.12 mmol/L (ref 1.12–1.23)
Chloride: 104 mmol/L (ref 101–111)
Creatinine, Ser: 1.1 mg/dL — ABNORMAL HIGH (ref 0.44–1.00)
Glucose, Bld: 82 mg/dL (ref 65–99)
HCT: 49 % — ABNORMAL HIGH (ref 36.0–46.0)
Hemoglobin: 16.7 g/dL — ABNORMAL HIGH (ref 12.0–15.0)
Potassium: 3.9 mmol/L (ref 3.5–5.1)
SODIUM: 141 mmol/L (ref 135–145)
TCO2: 21 mmol/L (ref 0–100)

## 2015-04-15 ENCOUNTER — Emergency Department (HOSPITAL_COMMUNITY)
Admission: EM | Admit: 2015-04-15 | Discharge: 2015-04-15 | Disposition: A | Discharge status: Home / Self Care | Payer: Medicaid Other | Attending: Emergency Medicine | Admitting: Emergency Medicine

## 2015-04-15 ENCOUNTER — Encounter (HOSPITAL_COMMUNITY): Payer: Self-pay | Admitting: *Deleted

## 2015-04-15 DIAGNOSIS — Z3490 Encounter for supervision of normal pregnancy, unspecified, unspecified trimester: Secondary | ICD-10-CM

## 2015-04-15 DIAGNOSIS — Z720 Tobacco use: Secondary | ICD-10-CM | POA: Diagnosis not present

## 2015-04-15 DIAGNOSIS — R112 Nausea with vomiting, unspecified: Secondary | ICD-10-CM | POA: Diagnosis present

## 2015-04-15 DIAGNOSIS — Z3201 Encounter for pregnancy test, result positive: Secondary | ICD-10-CM | POA: Insufficient documentation

## 2015-04-15 DIAGNOSIS — Z8659 Personal history of other mental and behavioral disorders: Secondary | ICD-10-CM | POA: Insufficient documentation

## 2015-04-15 DIAGNOSIS — R110 Nausea: Secondary | ICD-10-CM

## 2015-04-15 DIAGNOSIS — J45909 Unspecified asthma, uncomplicated: Secondary | ICD-10-CM | POA: Insufficient documentation

## 2015-04-15 LAB — POC URINE PREG, ED: Preg Test, Ur: POSITIVE — AB

## 2015-04-15 MED ORDER — ONDANSETRON HCL 4 MG PO TABS: 4.0000 mg | ORAL_TABLET | Freq: Four times a day (QID) | ORAL | Status: DC

## 2015-04-15 MED ORDER — COMPLETENATE 29-1 MG PO CHEW: 1.0000 | CHEWABLE_TABLET | Freq: Every day | ORAL | Status: DC

## 2015-04-15 NOTE — ED Notes (Signed)
PT had cold SX that started this AM

## 2015-04-15 NOTE — ED Notes (Signed)
Called x 2 to pull to fast track.  Noone answered.

## 2015-04-15 NOTE — Discharge Instructions (Signed)
Nausea and Vomiting  Nausea means you feel sick to your stomach. Throwing up (vomiting) is a reflex where stomach contents come out of your mouth.  HOME CARE   · Take medicine as told by your doctor.  · Do not force yourself to eat. However, you do need to drink fluids.  · If you feel like eating, eat a normal diet as told by your doctor.    Eat rice, wheat, potatoes, bread, lean meats, yogurt, fruits, and vegetables.    Avoid high-fat foods.  · Drink enough fluids to keep your pee (urine) clear or pale yellow.  · Ask your doctor how to replace body fluid losses (rehydrate). Signs of body fluid loss (dehydration) include:    Feeling very thirsty.    Dry lips and mouth.    Feeling dizzy.    Dark pee.    Peeing less than normal.    Feeling confused.    Fast breathing or heart rate.  GET HELP RIGHT AWAY IF:   · You have blood in your throw up.  · You have black or bloody poop (stool).  · You have a bad headache or stiff neck.  · You feel confused.  · You have bad belly (abdominal) pain.  · You have chest pain or trouble breathing.  · You do not pee at least once every 8 hours.  · You have cold, clammy skin.  · You keep throwing up after 24 to 48 hours.  · You have a fever.  MAKE SURE YOU:   · Understand these instructions.  · Will watch your condition.  · Will get help right away if you are not doing well or get worse.     This information is not intended to replace advice given to you by your health care provider. Make sure you discuss any questions you have with your health care provider.     Document Released: 11/30/2007 Document Revised: 09/05/2011 Document Reviewed: 11/12/2010  Elsevier Interactive Patient Education ©2016 [employer]
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
## 2015-04-15 NOTE — ED Notes (Signed)
Went in to patient's room to triage.  Patient would not get off the phone for me to triage.   I had advised patient that I needed to triage.   After a few minutes of patient still being on phone and making no effort to get off, I left the room.  Advised provider that patient would not get off the phone for me to triage.

## 2015-04-16 ENCOUNTER — Encounter (HOSPITAL_COMMUNITY): Payer: Self-pay | Admitting: *Deleted

## 2015-04-16 ENCOUNTER — Inpatient Hospital Stay (HOSPITAL_COMMUNITY)
Admission: AD | Admit: 2015-04-16 | Discharge: 2015-04-16 | Discharge status: Against Medical Advice | Payer: Medicaid Other | Source: Ambulatory Visit | Attending: Obstetrics & Gynecology | Admitting: Obstetrics & Gynecology

## 2015-04-16 DIAGNOSIS — Z5320 Procedure and treatment not carried out because of patient's decision for unspecified reasons: Secondary | ICD-10-CM | POA: Insufficient documentation

## 2015-04-16 DIAGNOSIS — R109 Unspecified abdominal pain: Secondary | ICD-10-CM | POA: Diagnosis present
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
## 2015-04-16 NOTE — MAU Note (Signed)
Urine in lab 

## 2015-04-16 NOTE — MAU Note (Signed)
[facility]  Told to come here for f/u. Pt c/o abd pain.

## 2015-04-16 NOTE — MAU Provider Note (Signed)
I walked in the room and patient requested that I put in her US because she needed to leave. I informed her that I would need to examine her and interview her before I could make the decision regarding an US today.  The patient started packing her things and stated she had to leave.   + fetal heart tones by doppler  Duane LopeJennifer I Yamen Castrogiovanni, NP 04/16/2015 5:45 PM

## 2015-05-05 ENCOUNTER — Inpatient Hospital Stay (HOSPITAL_COMMUNITY): Payer: Medicaid Other

## 2015-05-05 ENCOUNTER — Inpatient Hospital Stay (HOSPITAL_COMMUNITY)
Admission: AD | Admit: 2015-05-05 | Discharge: 2015-05-05 | Disposition: A | Discharge status: Home / Self Care | Payer: Medicaid Other | Source: Ambulatory Visit | Attending: Obstetrics and Gynecology | Admitting: Obstetrics and Gynecology

## 2015-05-05 ENCOUNTER — Encounter (HOSPITAL_COMMUNITY): Payer: Self-pay | Admitting: *Deleted

## 2015-05-05 DIAGNOSIS — R825 Elevated urine levels of drugs, medicaments and biological substances: Secondary | ICD-10-CM

## 2015-05-05 DIAGNOSIS — O99321 Drug use complicating pregnancy, first trimester: Secondary | ICD-10-CM | POA: Diagnosis not present

## 2015-05-05 DIAGNOSIS — S3991XA Unspecified injury of abdomen, initial encounter: Secondary | ICD-10-CM | POA: Diagnosis not present

## 2015-05-05 DIAGNOSIS — Z3A12 12 weeks gestation of pregnancy: Secondary | ICD-10-CM | POA: Diagnosis not present

## 2015-05-05 DIAGNOSIS — F1410 Cocaine abuse, uncomplicated: Secondary | ICD-10-CM | POA: Insufficient documentation

## 2015-05-05 DIAGNOSIS — O4691 Antepartum hemorrhage, unspecified, first trimester: Secondary | ICD-10-CM | POA: Diagnosis not present

## 2015-05-05 DIAGNOSIS — Y929 Unspecified place or not applicable: Secondary | ICD-10-CM | POA: Insufficient documentation

## 2015-05-05 DIAGNOSIS — F1490 Cocaine use, unspecified, uncomplicated: Secondary | ICD-10-CM

## 2015-05-05 DIAGNOSIS — O26899 Other specified pregnancy related conditions, unspecified trimester: Secondary | ICD-10-CM

## 2015-05-05 DIAGNOSIS — O4692 Antepartum hemorrhage, unspecified, second trimester: Secondary | ICD-10-CM

## 2015-05-05 DIAGNOSIS — R109 Unspecified abdominal pain: Secondary | ICD-10-CM

## 2015-05-05 LAB — URINALYSIS, ROUTINE W REFLEX MICROSCOPIC
Bilirubin Urine: NEGATIVE
Glucose, UA: NEGATIVE mg/dL
HGB URINE DIPSTICK: NEGATIVE
Ketones, ur: NEGATIVE mg/dL
LEUKOCYTES UA: NEGATIVE
Nitrite: NEGATIVE
PROTEIN: NEGATIVE mg/dL
SPECIFIC GRAVITY, URINE: 1.025 (ref 1.005–1.030)
UROBILINOGEN UA: 0.2 mg/dL (ref 0.0–1.0)
pH: 6 (ref 5.0–8.0)

## 2015-05-05 LAB — WET PREP, GENITAL
Clue Cells Wet Prep HPF POC: NONE SEEN
Trich, Wet Prep: NONE SEEN
YEAST WET PREP: NONE SEEN

## 2015-05-05 LAB — RAPID URINE DRUG SCREEN, HOSP PERFORMED
AMPHETAMINES: NOT DETECTED
Barbiturates: NOT DETECTED
Benzodiazepines: NOT DETECTED
COCAINE: POSITIVE — AB
OPIATES: NOT DETECTED
Tetrahydrocannabinol: POSITIVE — AB

## 2015-05-05 LAB — CBC
HCT: 34.1 % — ABNORMAL LOW (ref 36.0–46.0)
Hemoglobin: 11.7 g/dL — ABNORMAL LOW (ref 12.0–15.0)
MCH: 32.7 pg (ref 26.0–34.0)
MCHC: 34.3 g/dL (ref 30.0–36.0)
MCV: 95.3 fL (ref 78.0–100.0)
Platelets: 287 10*3/uL (ref 150–400)
RBC: 3.58 MIL/uL — ABNORMAL LOW (ref 3.87–5.11)
RDW: 12.4 % (ref 11.5–15.5)
WBC: 9.3 10*3/uL (ref 4.0–10.5)

## 2015-05-05 NOTE — MAU Note (Signed)
Pt presents to MAU with complaints of vaginal bleeding that started last night. Reports lower abdominal cramping
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
## 2015-05-05 NOTE — Discharge Instructions (Signed)
Abdominal Pain During Pregnancy Belly (abdominal) pain is common during pregnancy. Most of the time, it is not a serious problem. Other times, it can be a sign that something is wrong with the pregnancy. Always tell your doctor if you have belly pain. HOME CARE Monitor your belly pain for any changes. The following actions may help you feel better:  Do not have sex (intercourse) or put anything in your vagina until you feel better.  Rest until your pain stops.  Drink clear fluids if you feel sick to your stomach (nauseous). Do not eat solid food until you feel better.  Only take medicine as told by your doctor.  Keep all doctor visits as told. GET HELP RIGHT AWAY IF:   You are bleeding, leaking fluid, or pieces of tissue come out of your vagina.  You have more pain or cramping.  You keep throwing up (vomiting).  You have pain when you pee (urinate) or have blood in your pee.  You have a fever.  You do not feel your baby moving as much.  You feel very weak or feel like passing out.  You have trouble breathing, with or without belly pain.  You have a very bad headache and belly pain.  You have fluid leaking from your vagina and belly pain.  You keep having watery poop (diarrhea).  Your belly pain does not go away after resting, or the pain gets worse. MAKE SURE YOU:   Understand these instructions.  Will watch your condition.  Will get help right away if you are not doing well or get worse.   This information is not intended to replace advice given to you by your health care provider. Make sure you discuss any questions you have with your health care provider.   Document Released: 06/01/2009 Document Revised: 02/13/2013 Document Reviewed: 01/10/2013 Elsevier Interactive Patient Education [employer]2016 [employer] First Trimester of Pregnancy The first trimester of pregnancy is from week 1 until the end of week 12 (months 1 through 3). During this time, your baby will begin  to develop inside you. At 6-8 weeks, the eyes and face are formed, and the heartbeat can be seen on ultrasound. At the end of 12 weeks, all the baby's organs are formed. Prenatal care is all the medical care you receive before the birth of your baby. Make sure you get good prenatal care and follow all of your doctor's instructions. HOME CARE  Medicines  Take medicine only as told by your doctor. Some medicines are safe and some are not during pregnancy.  Take your prenatal vitamins as told by your doctor.  Take medicine that helps you poop (stool softener) as needed if your doctor says it is okay. Diet  Eat regular, healthy meals.  Your doctor will tell you the amount of weight gain that is right for you.  Avoid raw meat and uncooked cheese.  If you feel sick to your stomach (nauseous) or throw up (vomit):  Eat 4 or 5 small meals a day instead of 3 large meals.  Try eating a few soda crackers.  Drink liquids between meals instead of during meals.  If you have a hard time pooping (constipation):  Eat high-fiber foods like fresh vegetables, fruit, and whole grains.  Drink enough fluids to keep your pee (urine) clear or pale yellow. Activity and Exercise  Exercise only as told by your doctor. Stop exercising if you have cramps or pain in your lower belly (abdomen) or low back.  Try to avoid standing for long periods of time. Move your legs often if you must stand in one place for a long time. °· Avoid heavy lifting. °· Wear low-heeled shoes. Sit and stand up straight. °· You can have sex unless your doctor tells you not to. °Relief of Pain or Discomfort °· Wear a good support bra if your breasts are sore. °· Take warm water baths (sitz baths) to soothe pain or discomfort caused by hemorrhoids. Use hemorrhoid cream if your doctor says it is okay. °· Rest with your legs raised if you have leg cramps or low back pain. °· Wear support hose if you have puffy, bulging veins (varicose veins)  in your legs. Raise (elevate) your feet for 15 minutes, 3-4 times a day. Limit salt in your diet. °Prenatal Care °· Schedule your prenatal visits by the twelfth week of pregnancy. °· Write down your questions. Take them to your prenatal visits. °· Keep all your prenatal visits as told by your doctor. °Safety °· Wear your seat belt at all times when driving. °· Make a list of emergency phone numbers. The list should include numbers for family, friends, the hospital, and police and fire departments. °General Tips °· Ask your doctor for a referral to a local prenatal class. Begin classes no later than at the start of month 6 of your pregnancy. °· Ask for help if you need counseling or help with nutrition. Your doctor can give you advice or tell you where to go for help. °· Do not use hot tubs, steam rooms, or saunas. °· Do not douche or use tampons or scented sanitary pads. °· Do not cross your legs for long periods of time. °· Avoid litter boxes and soil used by cats. °· Avoid all smoking, herbs, and alcohol. Avoid drugs not approved by your doctor. °· Do not use any tobacco products, including cigarettes, chewing tobacco, and electronic cigarettes. If you need help quitting, ask your doctor. You may get counseling or other support to help you quit. °· Visit your dentist. At home, brush your teeth with a soft toothbrush. Be gentle when you floss. °GET HELP IF: °· You are dizzy. °· You have mild cramps or pressure in your lower belly. °· You have a nagging pain in your belly area. °· You continue to feel sick to your stomach, throw up, or have watery poop (diarrhea). °· You have a bad smelling fluid coming from your vagina. °· You have pain with peeing (urination). °· You have increased puffiness (swelling) in your face, hands, legs, or ankles. °GET HELP RIGHT AWAY IF:  °· You have a fever. °· You are leaking fluid from your vagina. °· You have spotting or bleeding from your vagina. °· You have very bad belly cramping  or pain. °· You gain or lose weight rapidly. °· You throw up blood. It may look like coffee grounds. °· You are around people who have German measles, fifth disease, or chickenpox. °· You have a very bad headache. °· You have shortness of breath. °· You have any kind of trauma, such as from a fall or a car accident. °  °This information is not intended to replace advice given to you by your health care provider. Make sure you discuss any questions you have with your health care provider. °  °Document Released: 11/30/2007 Document Revised: 07/04/2014 Document Reviewed: 04/23/2013 °Elsevier Interactive Patient Education ©2016 Elsevier Inc. ° °

## 2015-05-06 LAB — GC/CHLAMYDIA PROBE AMP (~~LOC~~) NOT AT ARMC
Chlamydia: NEGATIVE
Neisseria Gonorrhea: NEGATIVE

## 2015-05-06 LAB — HIV ANTIBODY (ROUTINE TESTING W REFLEX): HIV SCREEN 4TH GENERATION: NONREACTIVE

## 2015-06-12 ENCOUNTER — Encounter (HOSPITAL_COMMUNITY): Payer: Self-pay | Admitting: *Deleted

## 2015-06-12 ENCOUNTER — Inpatient Hospital Stay (HOSPITAL_COMMUNITY)
Admission: AD | Admit: 2015-06-12 | Discharge: 2015-06-12 | Disposition: A | Discharge status: Home / Self Care | Payer: Medicaid Other | Source: Ambulatory Visit | Attending: Obstetrics & Gynecology | Admitting: Obstetrics & Gynecology

## 2015-06-12 DIAGNOSIS — R109 Unspecified abdominal pain: Secondary | ICD-10-CM

## 2015-06-12 DIAGNOSIS — O99332 Smoking (tobacco) complicating pregnancy, second trimester: Secondary | ICD-10-CM | POA: Diagnosis not present

## 2015-06-12 DIAGNOSIS — O9989 Other specified diseases and conditions complicating pregnancy, childbirth and the puerperium: Secondary | ICD-10-CM | POA: Diagnosis not present

## 2015-06-12 DIAGNOSIS — O26892 Other specified pregnancy related conditions, second trimester: Secondary | ICD-10-CM | POA: Insufficient documentation

## 2015-06-12 DIAGNOSIS — F17210 Nicotine dependence, cigarettes, uncomplicated: Secondary | ICD-10-CM | POA: Diagnosis not present

## 2015-06-12 DIAGNOSIS — O9A212 Injury, poisoning and certain other consequences of external causes complicating pregnancy, second trimester: Secondary | ICD-10-CM

## 2015-06-12 DIAGNOSIS — O99321 Drug use complicating pregnancy, first trimester: Secondary | ICD-10-CM

## 2015-06-12 DIAGNOSIS — Z3A17 17 weeks gestation of pregnancy: Secondary | ICD-10-CM | POA: Diagnosis not present

## 2015-06-12 DIAGNOSIS — F1410 Cocaine abuse, uncomplicated: Secondary | ICD-10-CM

## 2015-06-12 MED ORDER — ACETAMINOPHEN 325 MG PO TABS
650.0000 mg | ORAL_TABLET | ORAL | Status: AC
  Administered 2015-06-12: 650 mg via ORAL
  Filled 2015-06-12: qty 2

## 2015-06-12 NOTE — Discharge Instructions (Signed)
Prenatal Care °WHAT IS PRENATAL CARE?  °Prenatal care is the process of caring for a pregnant woman before she gives birth. Prenatal care makes sure that she and her baby remain as healthy as possible throughout pregnancy. Prenatal care may be provided by a midwife, family practice health care provider, or a childbirth and pregnancy specialist (obstetrician). Prenatal care may include physical examinations, testing, treatments, and education on nutrition, lifestyle, and social support services. °WHY IS PRENATAL CARE SO IMPORTANT?  °Early and consistent prenatal care increases the chance that you and your baby will remain healthy throughout your pregnancy. This type of care also decreases a baby's risk of being born too early (prematurely), or being born smaller than expected (small for gestational age). Any underlying medical conditions you may have that could pose a risk during your pregnancy are discussed during prenatal care visits. You will also be monitored regularly for any new conditions that may arise during your pregnancy so they can be treated quickly and effectively. °WHAT HAPPENS DURING PRENATAL CARE VISITS? °Prenatal care visits may include the following: °Discussion °Tell your health care provider about any new signs or symptoms you have experienced since your last visit. These might include: °· Nausea or vomiting. °· Increased or decreased level of energy. °· Difficulty sleeping. °· Back or leg pain. °· Weight changes. °· Frequent urination. °· Shortness of breath with physical activity. °· Changes in your skin, such as the development of a rash or itchiness. °· Vaginal discharge or bleeding. °· Feelings of excitement or nervousness. °· Changes in your baby's movements. °You may want to write down any questions or topics you want to discuss with your health care provider and bring them with you to your appointment. °Examination °During your first prenatal care visit, you will likely have a complete  physical exam. Your health care provider will often examine your vagina, cervix, and the position of your uterus, as well as check your heart, lungs, and other body systems. As your pregnancy progresses, your health care provider will measure the size of your uterus and your baby's position inside your uterus. He or she may also examine you for early signs of labor. Your prenatal visits may also include checking your blood pressure and, after about 10-12 weeks of pregnancy, listening to your baby's heartbeat. °Testing °Regular testing often includes: °· Urinalysis. This checks your urine for glucose, protein, or signs of infection. °· Blood count. This checks the levels of white and red blood cells in your body. °· Tests for sexually transmitted infections (STIs). Testing for STIs at the beginning of pregnancy is routinely done and is required in many states. °· Antibody testing. You will be checked to see if you are immune to certain illnesses, such as rubella, that can affect a developing fetus. °· Glucose screen. Around 24-28 weeks of pregnancy, your blood glucose level will be checked for signs of gestational diabetes. Follow-up tests may be recommended. °· Group B strep. This is a bacteria that is commonly found inside a woman's vagina. This test will inform your health care provider if you need an antibiotic to reduce the amount of this bacteria in your body prior to labor and childbirth. °· Ultrasound. Many pregnant women undergo an ultrasound screening around 18-20 weeks of pregnancy to evaluate the health of the fetus and check for any developmental abnormalities. °· HIV (human immunodeficiency virus) testing. Early in your pregnancy, you will be screened for HIV. If you are at high risk for HIV, this test   may be repeated during your third trimester of pregnancy. °You may be offered other testing based on your age, personal or family medical history, or other factors.  °HOW OFTEN SHOULD I PLAN TO SEE MY  HEALTH CARE PROVIDER FOR PRENATAL CARE? °Your prenatal care check-up schedule depends on any medical conditions you have before, or develop during, your pregnancy. If you do not have any underlying medical conditions, you will likely be seen for checkups: °· Monthly, during the first 6 months of pregnancy. °· Twice a month during months 7 and 8 of pregnancy. °· Weekly starting in the 9th month of pregnancy and until delivery. °If you develop signs of early labor or other concerning signs or symptoms, you may need to see your health care provider more often. Ask your health care provider what prenatal care schedule is best for you. °WHAT CAN I DO TO KEEP MYSELF AND MY BABY AS HEALTHY AS POSSIBLE DURING MY PREGNANCY? °· Take a prenatal vitamin containing 400 micrograms (0.4 mg) of folic acid every day. Your health care provider may also ask you to take additional vitamins such as iodine, vitamin D, iron, copper, and zinc. °· Take 1500-2000 mg of calcium daily starting at your 20th week of pregnancy until you deliver your baby. °· Make sure you are up to date on your vaccinations. Unless directed otherwise by your health care provider: °¨ You should receive a tetanus, diphtheria, and pertussis (Tdap) vaccination between the 27th and 36th week of your pregnancy, regardless of when your last Tdap immunization occurred. This helps protect your baby from whooping cough (pertussis) after he or she is born. °¨ You should receive an annual inactivated influenza vaccine (IIV) to help protect you and your baby from influenza. This can be done at any point during your pregnancy. °· Eat a well-rounded diet that includes: °¨ Fresh fruits and vegetables. °¨ Lean proteins. °¨ Calcium-rich foods such as milk, yogurt, hard cheeses, and dark, leafy greens. °¨ Whole grain breads. °· Do not eat seafood high in mercury, including: °¨ Swordfish. °¨ Tilefish. °¨ Shark. °¨ King mackerel. °¨ More than 6 oz tuna per week. °· Do not eat: °¨ Raw  or undercooked meats or eggs. °¨ Unpasteurized foods, such as soft cheeses (brie, blue, or feta), juices, and milks. °¨ Lunch meats. °¨ Hot dogs that have not been heated until they are steaming. °· Drink enough water to keep your urine clear or pale yellow. For many women, this may be 10 or more 8 oz glasses of water each day. Keeping yourself hydrated helps deliver nutrients to your baby and may prevent the start of pre-term uterine contractions. °· Do not use any tobacco products including cigarettes, chewing tobacco, or electronic cigarettes. If you need help quitting, ask your health care provider. °· Do not drink beverages containing alcohol. No safe level of alcohol consumption during pregnancy has been determined. °· Do not use any illegal drugs. These can harm your developing baby or cause a miscarriage. °· Ask your health care provider or pharmacist before taking any prescription or over-the-counter medicines, herbs, or supplements. °· Limit your caffeine intake to no more than 200 mg per day. °· Exercise. Unless told otherwise by your health care provider, try to get 30 minutes of moderate exercise most days of the week. Do not  do high-impact activities, contact sports, or activities with a high risk of falling, such as horseback riding or downhill skiing. °· Get plenty of rest. °· Avoid anything that raises your   body temperature, such as hot tubs and saunas. °· If you own a cat, do not empty its litter box. Bacteria contained in cat feces can cause an infection called toxoplasmosis. This can result in serious harm to the fetus. °· Stay away from chemicals such as insecticides, lead, mercury, and cleaning or paint products that contain solvents. °· Do not have any X-rays taken unless medically necessary. °· Take a childbirth and breastfeeding preparation class. Ask your health care provider if you need a referral or recommendation. °  °This information is not intended to replace advice given to you by  your health care provider. Make sure you discuss any questions you have with your health care provider. °  °Document Released: 06/16/2003 Document Revised: 07/04/2014 Document Reviewed: 08/28/2013 °Elsevier Interactive Patient Education ©2016 [employer] Inc. ° °
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
## 2015-06-16 ENCOUNTER — Encounter (HOSPITAL_COMMUNITY): Payer: Self-pay | Admitting: *Deleted

## 2015-06-16 ENCOUNTER — Inpatient Hospital Stay (HOSPITAL_COMMUNITY)
Admission: AD | Admit: 2015-06-16 | Discharge: 2015-06-16 | Disposition: A | Discharge status: Home / Self Care | Payer: Medicaid Other | Source: Ambulatory Visit | Attending: Obstetrics & Gynecology | Admitting: Obstetrics & Gynecology

## 2015-06-16 DIAGNOSIS — Z3A18 18 weeks gestation of pregnancy: Secondary | ICD-10-CM | POA: Diagnosis not present

## 2015-06-16 DIAGNOSIS — F17210 Nicotine dependence, cigarettes, uncomplicated: Secondary | ICD-10-CM | POA: Insufficient documentation

## 2015-06-16 DIAGNOSIS — R109 Unspecified abdominal pain: Secondary | ICD-10-CM | POA: Diagnosis not present

## 2015-06-16 DIAGNOSIS — O26892 Other specified pregnancy related conditions, second trimester: Secondary | ICD-10-CM | POA: Insufficient documentation

## 2015-06-16 DIAGNOSIS — O9989 Other specified diseases and conditions complicating pregnancy, childbirth and the puerperium: Secondary | ICD-10-CM

## 2015-06-16 DIAGNOSIS — F1410 Cocaine abuse, uncomplicated: Secondary | ICD-10-CM

## 2015-06-16 DIAGNOSIS — O99332 Smoking (tobacco) complicating pregnancy, second trimester: Secondary | ICD-10-CM | POA: Diagnosis not present

## 2015-06-16 DIAGNOSIS — O99321 Drug use complicating pregnancy, first trimester: Secondary | ICD-10-CM

## 2015-06-16 DIAGNOSIS — O26899 Other specified pregnancy related conditions, unspecified trimester: Secondary | ICD-10-CM

## 2015-06-16 LAB — URINE MICROSCOPIC-ADD ON: RBC / HPF: NONE SEEN RBC/hpf (ref 0–5)

## 2015-06-16 LAB — RAPID URINE DRUG SCREEN, HOSP PERFORMED
Amphetamines: NOT DETECTED
Barbiturates: NOT DETECTED
Benzodiazepines: NOT DETECTED
Cocaine: POSITIVE — AB
Opiates: NOT DETECTED
Tetrahydrocannabinol: POSITIVE — AB

## 2015-06-16 LAB — URINALYSIS, ROUTINE W REFLEX MICROSCOPIC
BILIRUBIN URINE: NEGATIVE
GLUCOSE, UA: NEGATIVE mg/dL
HGB URINE DIPSTICK: NEGATIVE
KETONES UR: 15 mg/dL — AB
NITRITE: NEGATIVE
PROTEIN: NEGATIVE mg/dL
Specific Gravity, Urine: 1.025 (ref 1.005–1.030)
pH: 6 (ref 5.0–8.0)
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
## 2015-06-16 NOTE — MAU Note (Signed)
Pt upset about not getting U/S, signed out AMA.

## 2015-06-24 ENCOUNTER — Emergency Department (HOSPITAL_COMMUNITY): Payer: Medicaid Other

## 2015-06-24 ENCOUNTER — Encounter (HOSPITAL_COMMUNITY): Payer: Self-pay

## 2015-06-24 ENCOUNTER — Emergency Department (HOSPITAL_COMMUNITY)
Admission: EM | Admit: 2015-06-24 | Discharge: 2015-06-24 | Disposition: A | Discharge status: Home / Self Care | Payer: Medicaid Other | Attending: Emergency Medicine | Admitting: Emergency Medicine

## 2015-06-24 DIAGNOSIS — S3991XA Unspecified injury of abdomen, initial encounter: Secondary | ICD-10-CM | POA: Insufficient documentation

## 2015-06-24 DIAGNOSIS — O9A212 Injury, poisoning and certain other consequences of external causes complicating pregnancy, second trimester: Secondary | ICD-10-CM | POA: Insufficient documentation

## 2015-06-24 DIAGNOSIS — Z8659 Personal history of other mental and behavioral disorders: Secondary | ICD-10-CM | POA: Insufficient documentation

## 2015-06-24 DIAGNOSIS — O99332 Smoking (tobacco) complicating pregnancy, second trimester: Secondary | ICD-10-CM | POA: Diagnosis not present

## 2015-06-24 DIAGNOSIS — Y9301 Activity, walking, marching and hiking: Secondary | ICD-10-CM | POA: Diagnosis not present

## 2015-06-24 DIAGNOSIS — Y9289 Other specified places as the place of occurrence of the external cause: Secondary | ICD-10-CM | POA: Diagnosis not present

## 2015-06-24 DIAGNOSIS — F17210 Nicotine dependence, cigarettes, uncomplicated: Secondary | ICD-10-CM | POA: Insufficient documentation

## 2015-06-24 DIAGNOSIS — T148 Other injury of unspecified body region: Secondary | ICD-10-CM | POA: Insufficient documentation

## 2015-06-24 DIAGNOSIS — J45909 Unspecified asthma, uncomplicated: Secondary | ICD-10-CM | POA: Diagnosis not present

## 2015-06-24 DIAGNOSIS — Y998 Other external cause status: Secondary | ICD-10-CM | POA: Diagnosis not present

## 2015-06-24 DIAGNOSIS — S3992XA Unspecified injury of lower back, initial encounter: Secondary | ICD-10-CM | POA: Insufficient documentation

## 2015-06-24 DIAGNOSIS — T07XXXA Unspecified multiple injuries, initial encounter: Secondary | ICD-10-CM

## 2015-06-24 LAB — CBC WITH DIFFERENTIAL/PLATELET
Basophils Absolute: 0 10*3/uL (ref 0.0–0.1)
Basophils Relative: 0 %
EOS ABS: 0.1 10*3/uL (ref 0.0–0.7)
Eosinophils Relative: 1 %
HEMATOCRIT: 32.9 % — AB (ref 36.0–46.0)
HEMOGLOBIN: 10.9 g/dL — AB (ref 12.0–15.0)
LYMPHS ABS: 1.7 10*3/uL (ref 0.7–4.0)
Lymphocytes Relative: 24 %
MCH: 31.4 pg (ref 26.0–34.0)
MCHC: 33.1 g/dL (ref 30.0–36.0)
MCV: 94.8 fL (ref 78.0–100.0)
MONOS PCT: 10 %
Monocytes Absolute: 0.7 10*3/uL (ref 0.1–1.0)
NEUTROS ABS: 4.5 10*3/uL (ref 1.7–7.7)
NEUTROS PCT: 65 %
Platelets: 300 10*3/uL (ref 150–400)
RBC: 3.47 MIL/uL — ABNORMAL LOW (ref 3.87–5.11)
RDW: 12.6 % (ref 11.5–15.5)
WBC: 7 10*3/uL (ref 4.0–10.5)

## 2015-06-24 LAB — BASIC METABOLIC PANEL
Anion gap: 9 (ref 5–15)
BUN: 9 mg/dL (ref 6–20)
CHLORIDE: 106 mmol/L (ref 101–111)
CO2: 21 mmol/L — AB (ref 22–32)
CREATININE: 0.75 mg/dL (ref 0.44–1.00)
Calcium: 9.2 mg/dL (ref 8.9–10.3)
GFR calc Af Amer: >60 mL/min (ref >60)
GFR calc non Af Amer: >60 mL/min (ref >60)
GLUCOSE: 88 mg/dL (ref 65–99)
Potassium: 3.9 mmol/L (ref 3.5–5.1)
Sodium: 136 mmol/L (ref 135–145)
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
## 2015-06-24 NOTE — Discharge Instructions (Signed)
Tylenol as needed for pain.  Recheck with your OB/GYN as needed.

## 2015-06-24 NOTE — ED Notes (Signed)
Pt was gone to [place]

## 2015-06-24 NOTE — ED Notes (Signed)
MD James at the bedside  

## 2015-07-14 ENCOUNTER — Emergency Department (HOSPITAL_COMMUNITY)
Admission: EM | Admit: 2015-07-14 | Discharge: 2015-07-14 | Disposition: A | Discharge status: Home / Self Care | Payer: Medicaid Other | Attending: Emergency Medicine | Admitting: Emergency Medicine

## 2015-07-14 ENCOUNTER — Encounter (HOSPITAL_COMMUNITY): Payer: Self-pay

## 2015-07-14 DIAGNOSIS — Y998 Other external cause status: Secondary | ICD-10-CM | POA: Diagnosis not present

## 2015-07-14 DIAGNOSIS — J45909 Unspecified asthma, uncomplicated: Secondary | ICD-10-CM | POA: Insufficient documentation

## 2015-07-14 DIAGNOSIS — Y9289 Other specified places as the place of occurrence of the external cause: Secondary | ICD-10-CM | POA: Diagnosis not present

## 2015-07-14 DIAGNOSIS — F1210 Cannabis abuse, uncomplicated: Secondary | ICD-10-CM | POA: Insufficient documentation

## 2015-07-14 DIAGNOSIS — O99512 Diseases of the respiratory system complicating pregnancy, second trimester: Secondary | ICD-10-CM | POA: Insufficient documentation

## 2015-07-14 DIAGNOSIS — O9A212 Injury, poisoning and certain other consequences of external causes complicating pregnancy, second trimester: Secondary | ICD-10-CM | POA: Insufficient documentation

## 2015-07-14 DIAGNOSIS — S3991XA Unspecified injury of abdomen, initial encounter: Secondary | ICD-10-CM | POA: Insufficient documentation

## 2015-07-14 DIAGNOSIS — Z3A21 21 weeks gestation of pregnancy: Secondary | ICD-10-CM | POA: Insufficient documentation

## 2015-07-14 DIAGNOSIS — O99332 Smoking (tobacco) complicating pregnancy, second trimester: Secondary | ICD-10-CM | POA: Diagnosis not present

## 2015-07-14 DIAGNOSIS — F17210 Nicotine dependence, cigarettes, uncomplicated: Secondary | ICD-10-CM | POA: Insufficient documentation

## 2015-07-14 DIAGNOSIS — F1410 Cocaine abuse, uncomplicated: Secondary | ICD-10-CM | POA: Insufficient documentation

## 2015-07-14 DIAGNOSIS — Y9389 Activity, other specified: Secondary | ICD-10-CM | POA: Insufficient documentation

## 2015-07-14 DIAGNOSIS — O99322 Drug use complicating pregnancy, second trimester: Secondary | ICD-10-CM | POA: Insufficient documentation

## 2015-07-14 DIAGNOSIS — IMO0002 Reserved for concepts with insufficient information to code with codable children: Secondary | ICD-10-CM

## 2015-07-14 LAB — URINALYSIS, ROUTINE W REFLEX MICROSCOPIC
BILIRUBIN URINE: NEGATIVE
Glucose, UA: NEGATIVE mg/dL
Hgb urine dipstick: NEGATIVE
Ketones, ur: NEGATIVE mg/dL
NITRITE: NEGATIVE
Protein, ur: NEGATIVE mg/dL
pH: 6 (ref 5.0–8.0)

## 2015-07-14 LAB — RAPID URINE DRUG SCREEN, HOSP PERFORMED
AMPHETAMINES: NOT DETECTED
Barbiturates: NOT DETECTED
Benzodiazepines: NOT DETECTED
Cocaine: POSITIVE — AB
Opiates: NOT DETECTED
Tetrahydrocannabinol: POSITIVE — AB

## 2015-07-14 LAB — COMPREHENSIVE METABOLIC PANEL
ALT: 30 U/L (ref 14–54)
AST: 24 U/L (ref 15–41)
Albumin: 3.7 g/dL (ref 3.5–5.0)
Alkaline Phosphatase: 61 U/L (ref 38–126)
Anion gap: 9 (ref 5–15)
BUN: 6 mg/dL (ref 6–20)
CHLORIDE: 103 mmol/L (ref 101–111)
CO2: 24 mmol/L (ref 22–32)
CREATININE: 0.6 mg/dL (ref 0.44–1.00)
Calcium: 9.7 mg/dL (ref 8.9–10.3)
GFR calc non Af Amer: >60 mL/min (ref >60)
Glucose, Bld: 103 mg/dL — ABNORMAL HIGH (ref 65–99)
POTASSIUM: 4.2 mmol/L (ref 3.5–5.1)
SODIUM: 136 mmol/L (ref 135–145)
Total Bilirubin: 0.4 mg/dL (ref 0.3–1.2)
Total Protein: 7.6 g/dL (ref 6.5–8.1)

## 2015-07-14 LAB — CBC WITH DIFFERENTIAL/PLATELET
Basophils Absolute: 0 10*3/uL (ref 0.0–0.1)
Basophils Relative: 0 %
EOS PCT: 1 %
Eosinophils Absolute: 0.1 10*3/uL (ref 0.0–0.7)
HCT: 34.6 % — ABNORMAL LOW (ref 36.0–46.0)
Hemoglobin: 11.4 g/dL — ABNORMAL LOW (ref 12.0–15.0)
LYMPHS ABS: 1.9 10*3/uL (ref 0.7–4.0)
LYMPHS PCT: 22 %
MCH: 31.6 pg (ref 26.0–34.0)
MCHC: 32.9 g/dL (ref 30.0–36.0)
MCV: 95.8 fL (ref 78.0–100.0)
Monocytes Absolute: 0.5 10*3/uL (ref 0.1–1.0)
Monocytes Relative: 6 %
NEUTROS ABS: 6.1 10*3/uL (ref 1.7–7.7)
Neutrophils Relative %: 71 %
PLATELETS: 289 10*3/uL (ref 150–400)
RBC: 3.61 MIL/uL — AB (ref 3.87–5.11)
RDW: 12.4 % (ref 11.5–15.5)
WBC: 8.6 10*3/uL (ref 4.0–10.5)

## 2015-07-14 LAB — URINE MICROSCOPIC-ADD ON

## 2015-07-14 LAB — ETHANOL: Alcohol, Ethyl (B): <5 mg/dL (ref <5)

## 2015-07-14 NOTE — ED Notes (Signed)
Gave pt [place] sandwich, applesauce and ice water, per [facility]
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
## 2015-07-14 NOTE — ED Notes (Signed)
[employer] at bedside per pt's request
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
## 2015-07-14 NOTE — ED Notes (Signed)
OB rapid response nurse in to assess pt at this time
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
## 2015-07-14 NOTE — ED Notes (Signed)
TTS completed.  Pt resting quietly at this time

## 2015-07-14 NOTE — Discharge Instructions (Signed)
Domestic Violence Information WHAT IS DOMESTIC VIOLENCE?  Domestic violence, also called intimate partner violence, can involve physical, emotional, psychological, sexual, and economic abuse by a current or former intimate partner. Stalking is also considered a type of domestic violence. Domestic violence can happen between people who are or were:   Married.  Dating.  Living together. Abusers repeatedly act to maintain control and power over their partner. Physical abuse can include:  Slapping.   Hitting.   Kicking.   Punching.  Choking.  Pulling the victim's hair.  Damaging the victim's property.  Threatening or hurting the victim with weapons.  Trapping the victim in his or her home.  Forcing the victim to use drugs or alcohol. Emotional and psychological abuse can include:  Threats.  Insults.   Isolation.   Humiliation.  Jealousy and possessiveness.  Blame.  Withholding affection.   Intimidation.   Manipulation.   Limiting contact with friends and family.  Sexual abuse can include:  Forcing sex.   Forcing sexual touching.   Hurting the victim during sex.  Forcing the victim to have sex with other people.  Giving the victim a sexually transmitted disease (STD) on purpose. Economic abuse can include:  Controlling resources, such as money, food, transportation, a phone, or computer.  Stealing money from the victim or his or her family or friends.  Forbidding the victim to work.  Refusing to work or to contribute to the household. Stalking can include:  Making repeated, unwanted phone calls, emails, or text messages.  Leaving cards, letters, flowers or other items the victim does not want.  Watching or following the victim from a distance.  Going to places where the victim does not want the abuser.  Entering the victim's home or car.  Damaging the victim's personal property. WHAT ARE SOME WARNING SIGNS OF DOMESTIC  VIOLENCE?  Physical signs  Bruises.   Broken bones.   Burns or cuts.   Physical pain.   Head injury. Emotional and psychological signs  Crying.   Depression.   Hopelessness.   Desperation.   Trouble sleeping.   Fear of partner.   Anxiety.  Suicidal behavior.  Antisocial behavior.  Low self-esteem.  Fear of intimacy.  Flashbacks. Sexual signs  Bruising, swelling, or bleeding of the genital or rectal area.   Signs of a sexually transmitted infection, such as genital sores, warts, or discharge coming from the genital area.   Pain in the genital area.   Unintended pregnancy.  Problems with pregnancy, including delayed prenatal care and prematurity. Economic signs  Having little money or food.   Homelessness.  Asking for or borrowing money. WHAT ARE COMMON BEHAVIORS OF THOSE AFFECTED BY DOMESTIC VIOLENCE? Those affected by domestic violence may:   Be late to work or other events.   Not show up to places as promised.   Have to let their partner know where they are and who they are with.   Be isolated or kept from seeing friends or family.   Make comments about their partner's temper or behavior.   Make excuses for their partner.   Engage in high-risk sexual behaviors.  Use drugs or alcohol.  Have unhealthy diet-related behaviors. WHAT ARE COMMON FEELINGS OF THOSE AFFECTED BY DOMESTIC VIOLENCE?  Those affected by domestic violence may feel that:   They have to be careful not to say or do things that trigger their partner's anger.   They cannot do anything right.   They deserve to be treated badly.  They are overreacting to their partner's behavior or temper.   They cannot trust their own feelings.   They cannot trust other people.   They are trapped.   Their partner would take away their children.   They are emotionally drained or numb.   Their life is in danger.   They might have to kill  their partner to survive.  WHERE CAN YOU GET HELP?  Domestic violence hotlines and websites If you do not feel safe searching for help online at home, use a computer at [employer] to access [employer]. Call 911 if you are in immediate danger or need medical help.   [employer].  The 24-hour phone hotline is 769-257-2894 or 531-715-2513 (TTY).   The videophone is available Monday through Friday, 9 a.m. to 5 p.m. Call [phone].  The website is http://thehotline.org  The National Sexual Assault Hotline.  The 24-hour phone hotline is 424 284 4557.  You can access the online hotline at [employer] Shelters for victims of domestic violence  If you are a victim of domestic violence, there are resources to help you find a temporary place for you and your children to live (shelter). The specific address of these shelters is often not known to the public.  Police Report assaults, threats, and stalking to the police.  Counselors and counseling centers People who have been victims of domestic violence can benefit from counseling. Counseling can help you cope with difficult emotions and empower you to plan for your future safety. The topics you discuss with a counselor are private and confidential. Children of domestic violence victims also might need counseling to manage stress and anxiety.  The court system You can work with a Clinical research associate or an advocate to get legal protection against an abuser. Protection includes restraining orders and private addresses. Crimes against you, such as assault, can also be prosecuted through the courts. Laws vary by state.   This information is not intended to replace advice given to you by your health care provider. Make sure you discuss any questions you have with your health care provider.   Document Released: 09/03/2003 Document Revised: 07/04/2014 Document Reviewed: 02/28/2014 Elsevier Interactive  Patient Education 2016 [employer].  Polysubstance Abuse When people abuse more than one drug or type of drug it is called polysubstance or polydrug abuse. For example, many smokers also drink alcohol. This is one form of polydrug abuse. Polydrug abuse also refers to the use of a drug to counteract an unpleasant effect produced by another drug. It may also be used to help with withdrawal from another drug. People who take stimulants may become agitated. Sometimes this agitation is countered with a tranquilizer. This helps protect against the unpleasant side effects. Polydrug abuse also refers to the use of different drugs at the same time.  Anytime drug use is interfering with normal living activities, it has become abuse. This includes problems with family and friends. Psychological dependence has developed when your mind tells you that the drug is needed. This is usually followed by physical dependence which has developed when continuing increases of drug are required to get the same feeling or "high". This is known as addiction or chemical dependency. A person's risk is much higher if there is a history of chemical dependency in the family. SIGNS OF CHEMICAL DEPENDENCY  You have been told by friends or family that drugs have become a problem.  You fight when using drugs.  You are having blackouts (not remembering  what you do while using).  You feel sick from using drugs but continue using.  You lie about use or amounts of drugs (chemicals) used.  You need chemicals to get you going.  You are suffering in work performance or in school because of drug use.  You get sick from use of drugs but continue to use anyway.  You need drugs to relate to people or feel comfortable in social situations.  You use drugs to forget problems. "Yes" answered to any of the above signs of chemical dependency indicates there are problems. The longer the use of drugs continues, the greater the problems will  become. If there is a family history of drug or alcohol use, it is best not to experiment with these drugs. Continual use leads to tolerance. After tolerance develops more of the drug is needed to get the same feeling. This is followed by addiction. With addiction, drugs become the most important part of life. It becomes more important to take drugs than participate in the other usual activities of life. This includes relating to friends and family. Addiction is followed by dependency. Dependency is a condition where drugs are now needed not just to get high, but to feel normal. Addiction cannot be cured but it can be stopped. This often requires outside help and the care of professionals. Treatment centers are listed in the yellow pages under: Cocaine, Narcotics, and Alcoholics Anonymous. Most hospitals and clinics can refer you to a specialized care center. Talk to your caregiver if you need help.   This information is not intended to replace advice given to you by your health care provider. Make sure you discuss any questions you have with your health care provider.   Document Released: 02/02/2005 Document Revised: 09/05/2011 Document Reviewed: 06/18/2014 Elsevier Interactive Patient Education [employer]
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
## 2015-07-14 NOTE — Progress Notes (Signed)
Spoke with Dr. Erin Fulling. Pt is to follow up at the clinic. OB cleared.
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
## 2015-07-14 NOTE — ED Notes (Addendum)
Gave pt graham crackers, per [facility] - NP

## 2015-08-18 ENCOUNTER — Encounter: Payer: Self-pay | Admitting: Family

## 2015-08-18 ENCOUNTER — Ambulatory Visit (INDEPENDENT_AMBULATORY_CARE_PROVIDER_SITE_OTHER): Payer: Medicaid Other | Admitting: Family

## 2015-08-18 ENCOUNTER — Other Ambulatory Visit (HOSPITAL_COMMUNITY)
Admission: RE | Admit: 2015-08-18 | Discharge: 2015-08-18 | Disposition: A | Discharge status: Home / Self Care | Payer: Medicaid Other | Source: Ambulatory Visit | Attending: Family | Admitting: Family

## 2015-08-18 VITALS — BP 98/54 | HR 91 | Temp 98.4°F | Wt 179.3 lb

## 2015-08-18 DIAGNOSIS — F1410 Cocaine abuse, uncomplicated: Secondary | ICD-10-CM

## 2015-08-18 DIAGNOSIS — O0993 Supervision of high risk pregnancy, unspecified, third trimester: Secondary | ICD-10-CM

## 2015-08-18 DIAGNOSIS — F1920 Other psychoactive substance dependence, uncomplicated: Secondary | ICD-10-CM | POA: Diagnosis not present

## 2015-08-18 DIAGNOSIS — O0990 Supervision of high risk pregnancy, unspecified, unspecified trimester: Secondary | ICD-10-CM | POA: Insufficient documentation

## 2015-08-18 DIAGNOSIS — Z01419 Encounter for gynecological examination (general) (routine) without abnormal findings: Secondary | ICD-10-CM | POA: Diagnosis not present

## 2015-08-18 DIAGNOSIS — O99323 Drug use complicating pregnancy, third trimester: Secondary | ICD-10-CM

## 2015-08-18 DIAGNOSIS — O99320 Drug use complicating pregnancy, unspecified trimester: Secondary | ICD-10-CM | POA: Insufficient documentation

## 2015-08-18 DIAGNOSIS — Z113 Encounter for screening for infections with a predominantly sexual mode of transmission: Secondary | ICD-10-CM | POA: Diagnosis present

## 2015-08-18 DIAGNOSIS — F1490 Cocaine use, unspecified, uncomplicated: Secondary | ICD-10-CM

## 2015-08-18 LAB — WET PREP, GENITAL
TRICH WET PREP: NONE SEEN
YEAST WET PREP: NONE SEEN

## 2015-08-18 NOTE — Patient Instructions (Signed)
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
## 2015-08-19 ENCOUNTER — Telehealth: Payer: Self-pay | Admitting: *Deleted

## 2015-08-19 ENCOUNTER — Other Ambulatory Visit: Payer: Self-pay | Admitting: Family

## 2015-08-19 DIAGNOSIS — B9689 Other specified bacterial agents as the cause of diseases classified elsewhere: Secondary | ICD-10-CM

## 2015-08-19 DIAGNOSIS — N760 Acute vaginitis: Principal | ICD-10-CM

## 2015-08-19 LAB — GC/CHLAMYDIA PROBE AMP (~~LOC~~) NOT AT ARMC
CHLAMYDIA, DNA PROBE: NEGATIVE
Neisseria Gonorrhea: NEGATIVE

## 2015-08-19 LAB — CULTURE, OB URINE

## 2015-08-19 LAB — GLUCOSE TOLERANCE, 1 HOUR (50G) W/O FASTING: GLUCOSE, 1 HR, GESTATIONAL: 93 mg/dL (ref <140)

## 2015-08-19 MED ORDER — METRONIDAZOLE 500 MG PO TABS: 500.0000 mg | ORAL_TABLET | Freq: Two times a day (BID) | ORAL | Status: DC

## 2015-08-19 NOTE — Telephone Encounter (Signed)
Patient has BV per Monica Holloway, [place]. Prescription sent to pharmacy. Attempted to call listed number, a female stating he was her dad answered. Asked me to call her at [phone]. When I called this number went straight to voice mail with message that mail box was full.

## 2015-08-20 ENCOUNTER — Ambulatory Visit (HOSPITAL_COMMUNITY)
Admission: RE | Admit: 2015-08-20 | Discharge: 2015-08-20 | Disposition: A | Discharge status: Home / Self Care | Payer: Medicaid Other | Source: Ambulatory Visit | Attending: Family | Admitting: Family

## 2015-08-20 ENCOUNTER — Encounter (HOSPITAL_COMMUNITY): Payer: Self-pay

## 2015-08-20 ENCOUNTER — Other Ambulatory Visit: Payer: Self-pay | Admitting: Family

## 2015-08-20 DIAGNOSIS — O99312 Alcohol use complicating pregnancy, second trimester: Secondary | ICD-10-CM | POA: Insufficient documentation

## 2015-08-20 DIAGNOSIS — O99322 Drug use complicating pregnancy, second trimester: Secondary | ICD-10-CM

## 2015-08-20 DIAGNOSIS — Z36 Encounter for antenatal screening of mother: Secondary | ICD-10-CM | POA: Diagnosis not present

## 2015-08-20 DIAGNOSIS — Z3A27 27 weeks gestation of pregnancy: Secondary | ICD-10-CM | POA: Insufficient documentation

## 2015-08-20 DIAGNOSIS — Z3689 Encounter for other specified antenatal screening: Secondary | ICD-10-CM

## 2015-08-20 DIAGNOSIS — O0993 Supervision of high risk pregnancy, unspecified, third trimester: Secondary | ICD-10-CM

## 2015-08-20 LAB — PRENATAL PROFILE (SOLSTAS)
Antibody Screen: NEGATIVE
BASOS ABS: 0 10*3/uL (ref 0.0–0.1)
Basophils Relative: 0 % (ref 0–1)
Eosinophils Absolute: 0.1 10*3/uL (ref 0.0–0.7)
Eosinophils Relative: 2 % (ref 0–5)
HEMATOCRIT: 29.6 % — AB (ref 36.0–46.0)
HEP B S AG: NEGATIVE
HIV: NONREACTIVE
Hemoglobin: 10 g/dL — ABNORMAL LOW (ref 12.0–15.0)
LYMPHS ABS: 1.4 10*3/uL (ref 0.7–4.0)
LYMPHS PCT: 24 % (ref 12–46)
MCH: 32.3 pg (ref 26.0–34.0)
MCHC: 33.8 g/dL (ref 30.0–36.0)
MCV: 95.5 fL (ref 78.0–100.0)
MONO ABS: 0.8 10*3/uL (ref 0.1–1.0)
MPV: 9.7 fL (ref 8.6–12.4)
Monocytes Relative: 14 % — ABNORMAL HIGH (ref 3–12)
NEUTROS ABS: 3.6 10*3/uL (ref 1.7–7.7)
Neutrophils Relative %: 60 % (ref 43–77)
Platelets: 264 10*3/uL (ref 150–400)
RBC: 3.1 MIL/uL — AB (ref 3.87–5.11)
RDW: 13 % (ref 11.5–15.5)
Rh Type: POSITIVE
Rubella: 1.71 Index — ABNORMAL HIGH (ref <0.90)
WBC: 6 10*3/uL (ref 4.0–10.5)

## 2015-08-20 LAB — HEMOGLOBINOPATHY EVALUATION
HEMOGLOBIN OTHER: 0 %
Hgb A2 Quant: 2.7 % (ref 2.2–3.2)
Hgb A: 97.3 % (ref 96.8–97.8)
Hgb F Quant: 0 % (ref 0.0–2.0)
Hgb S Quant: 0 %

## 2015-08-20 LAB — CYTOLOGY - PAP

## 2015-08-20 NOTE — Telephone Encounter (Signed)
Called pt and received message that mailbox is full.  Letter sent.

## 2015-08-23 LAB — COCAINE METABOLITE (GC/LC/MS), URINE: BENZOYLECGONINE GC/MS CONF: 3428 ng/mL — AB (ref <100)

## 2015-08-23 LAB — CANNABANOIDS (GC/LC/MS), URINE: THC-COOH (GC/LC/MS), ur confirm: 20 ng/mL — AB (ref <5)

## 2015-08-24 ENCOUNTER — Ambulatory Visit: Payer: Medicaid Other

## 2015-08-25 LAB — PRESCRIPTION MONITORING PROFILE (19 PANEL)
Amphetamine/Meth: NEGATIVE ng/mL
Barbiturate Screen, Urine: NEGATIVE ng/mL
Benzodiazepine Screen, Urine: NEGATIVE ng/mL
Buprenorphine, Urine: NEGATIVE ng/mL
CARISOPRODOL, URINE: NEGATIVE ng/mL
CREATININE, URINE: 79.28 mg/dL (ref >20.0)
ECSTASY: NEGATIVE ng/mL
FENTANYL URINE: NEGATIVE ng/mL
MEPERIDINE UR: NEGATIVE ng/mL
METHADONE SCREEN, URINE: NEGATIVE ng/mL
Methaqualone: NEGATIVE ng/mL
Nitrites, Initial: NEGATIVE ug/mL
OXYCODONE SCRN UR: NEGATIVE ng/mL
Opiate Screen, Urine: NEGATIVE ng/mL
PH URINE, INITIAL: 7.7 pH (ref 4.5–8.9)
PHENCYCLIDINE, UR: NEGATIVE ng/mL
Propoxyphene: NEGATIVE ng/mL
Tapentadol, urine: NEGATIVE ng/mL
Tramadol Scrn, Ur: NEGATIVE ng/mL
Zolpidem, Urine: NEGATIVE ng/mL

## 2015-08-29 ENCOUNTER — Encounter (HOSPITAL_COMMUNITY): Payer: Self-pay | Admitting: Emergency Medicine

## 2015-08-29 ENCOUNTER — Emergency Department (HOSPITAL_COMMUNITY): Payer: Medicaid Other

## 2015-08-29 ENCOUNTER — Emergency Department (HOSPITAL_COMMUNITY)
Admission: EM | Admit: 2015-08-29 | Discharge: 2015-08-29 | Disposition: A | Discharge status: Home / Self Care | Payer: Medicaid Other | Attending: Emergency Medicine | Admitting: Emergency Medicine

## 2015-08-29 DIAGNOSIS — R0602 Shortness of breath: Secondary | ICD-10-CM

## 2015-08-29 DIAGNOSIS — O99333 Smoking (tobacco) complicating pregnancy, third trimester: Secondary | ICD-10-CM | POA: Diagnosis not present

## 2015-08-29 DIAGNOSIS — Z331 Pregnant state, incidental: Secondary | ICD-10-CM

## 2015-08-29 DIAGNOSIS — Z8659 Personal history of other mental and behavioral disorders: Secondary | ICD-10-CM | POA: Diagnosis not present

## 2015-08-29 DIAGNOSIS — Z3A28 28 weeks gestation of pregnancy: Secondary | ICD-10-CM | POA: Insufficient documentation

## 2015-08-29 DIAGNOSIS — J45901 Unspecified asthma with (acute) exacerbation: Secondary | ICD-10-CM | POA: Diagnosis not present

## 2015-08-29 DIAGNOSIS — J159 Unspecified bacterial pneumonia: Secondary | ICD-10-CM | POA: Diagnosis not present

## 2015-08-29 DIAGNOSIS — R079 Chest pain, unspecified: Secondary | ICD-10-CM

## 2015-08-29 DIAGNOSIS — O99513 Diseases of the respiratory system complicating pregnancy, third trimester: Secondary | ICD-10-CM | POA: Insufficient documentation

## 2015-08-29 DIAGNOSIS — F1410 Cocaine abuse, uncomplicated: Secondary | ICD-10-CM | POA: Diagnosis not present

## 2015-08-29 DIAGNOSIS — Z3490 Encounter for supervision of normal pregnancy, unspecified, unspecified trimester: Secondary | ICD-10-CM

## 2015-08-29 DIAGNOSIS — J189 Pneumonia, unspecified organism: Secondary | ICD-10-CM

## 2015-08-29 DIAGNOSIS — O99343 Other mental disorders complicating pregnancy, third trimester: Secondary | ICD-10-CM | POA: Insufficient documentation

## 2015-08-29 DIAGNOSIS — F17210 Nicotine dependence, cigarettes, uncomplicated: Secondary | ICD-10-CM | POA: Insufficient documentation

## 2015-08-29 DIAGNOSIS — O9989 Other specified diseases and conditions complicating pregnancy, childbirth and the puerperium: Secondary | ICD-10-CM | POA: Diagnosis present

## 2015-08-29 LAB — CBC WITH DIFFERENTIAL/PLATELET
BASOS ABS: 0 10*3/uL (ref 0.0–0.1)
BASOS PCT: 0 %
EOS ABS: 0.3 10*3/uL (ref 0.0–0.7)
Eosinophils Relative: 2 %
HCT: 35.8 % — ABNORMAL LOW (ref 36.0–46.0)
HEMOGLOBIN: 12.2 g/dL (ref 12.0–15.0)
LYMPHS ABS: 2.1 10*3/uL (ref 0.7–4.0)
Lymphocytes Relative: 18 %
MCH: 32.6 pg (ref 26.0–34.0)
MCHC: 34.1 g/dL (ref 30.0–36.0)
MCV: 95.7 fL (ref 78.0–100.0)
Monocytes Absolute: 0.8 10*3/uL (ref 0.1–1.0)
Monocytes Relative: 6 %
NEUTROS PCT: 74 %
Neutro Abs: 8.5 10*3/uL — ABNORMAL HIGH (ref 1.7–7.7)
PLATELETS: 335 10*3/uL (ref 150–400)
RBC: 3.74 MIL/uL — ABNORMAL LOW (ref 3.87–5.11)
RDW: 12.9 % (ref 11.5–15.5)
WBC: 11.6 10*3/uL — AB (ref 4.0–10.5)

## 2015-08-29 LAB — BASIC METABOLIC PANEL
ANION GAP: 13 (ref 5–15)
BUN: <5 mg/dL — ABNORMAL LOW (ref 6–20)
CHLORIDE: 104 mmol/L (ref 101–111)
CO2: 19 mmol/L — ABNORMAL LOW (ref 22–32)
Calcium: 9.4 mg/dL (ref 8.9–10.3)
Creatinine, Ser: 0.5 mg/dL (ref 0.44–1.00)
Glucose, Bld: 93 mg/dL (ref 65–99)
POTASSIUM: 3.8 mmol/L (ref 3.5–5.1)
SODIUM: 136 mmol/L (ref 135–145)

## 2015-08-29 LAB — RAPID URINE DRUG SCREEN, HOSP PERFORMED
AMPHETAMINES: NOT DETECTED
BENZODIAZEPINES: NOT DETECTED
Barbiturates: NOT DETECTED
COCAINE: POSITIVE — AB
OPIATES: NOT DETECTED
TETRAHYDROCANNABINOL: NOT DETECTED

## 2015-08-29 LAB — ETHANOL: Alcohol, Ethyl (B): <5 mg/dL (ref <5)

## 2015-08-29 MED ORDER — DEXTROSE 5 % IV SOLN
1.0000 g | Freq: Once | INTRAVENOUS | Status: AC
  Administered 2015-08-29: 1 g via INTRAVENOUS
  Filled 2015-08-29: qty 10

## 2015-08-29 MED ORDER — ACETAMINOPHEN 325 MG PO TABS
650.0000 mg | ORAL_TABLET | Freq: Once | ORAL | Status: AC
  Administered 2015-08-29: 650 mg via ORAL
  Filled 2015-08-29: qty 2

## 2015-08-29 MED ORDER — PREDNISONE 50 MG PO TABS
50.0000 mg | ORAL_TABLET | Freq: Once | ORAL | Status: AC
  Administered 2015-08-29: 50 mg via ORAL
  Filled 2015-08-29: qty 1

## 2015-08-29 MED ORDER — AZITHROMYCIN 250 MG PO TABS
500.0000 mg | ORAL_TABLET | Freq: Once | ORAL | Status: AC
  Administered 2015-08-29: 500 mg via ORAL
  Filled 2015-08-29: qty 2

## 2015-08-29 MED ORDER — LORAZEPAM 1 MG PO TABS: 1.0000 mg | ORAL_TABLET | Freq: Once | ORAL | Status: DC

## 2015-08-29 MED ORDER — AZITHROMYCIN 250 MG PO TABS: ORAL_TABLET | ORAL | Status: DC

## 2015-08-29 MED ORDER — METRONIDAZOLE 500 MG PO TABS: 500.0000 mg | ORAL_TABLET | Freq: Two times a day (BID) | ORAL | Status: DC

## 2015-08-29 MED ORDER — AMOXICILLIN 500 MG PO CAPS
500.0000 mg | ORAL_CAPSULE | Freq: Once | ORAL | Status: AC
  Administered 2015-08-29: 500 mg via ORAL
  Filled 2015-08-29: qty 1

## 2015-08-29 MED ORDER — IPRATROPIUM-ALBUTEROL 0.5-2.5 (3) MG/3ML IN SOLN
3.0000 mL | Freq: Once | RESPIRATORY_TRACT | Status: DC
  Filled 2015-08-29: qty 3

## 2015-08-29 NOTE — ED Notes (Signed)
Pt walked and sats dropped to 92%, MD Radford PaxBeaton aware.  Pt refused her breathing tx earlier and MD aware.

## 2015-08-29 NOTE — ED Provider Notes (Signed)
CSN: [id number]     Arrival date & time 08/29/15  1315 History   First MD Initiated Contact with Patient 08/29/15 1327     Chief Complaint  Patient presents with  . Drug Problem     (Consider location/radiation/quality/duration/timing/severity/associated sxs/prior Treatment) HPI   30 year old female who presents with shortness of breath. History of asthma and polysubstance abuse. Currently [redacted] weeks gestational age. Patient poor history due to poor cooperation and mildly altered. States that she smoked a cigarette today that was possibly laced with something just prior to arrival. Developed chest pain, sharp and squeezing, with shortness and breath. States recent cough productive of phlegm. States feeling hot and cold. Father who later presents to bedside states that she was in her usual state of health when he talked to her earlier in the day and was about to bring her food. 30 minutes after talking to her, states that she sounded weird and altered on the phone and told him she was short of breath and going to the ED.  Past Medical History  Diagnosis Date  . Asthma   . Panic disorder   . Panic attacks    Past Surgical History  Procedure Laterality Date  . No past surgeries     Family History  Problem Relation Age of Onset  . Cancer Mother   . Diabetes Father    Social History  Substance Use Topics  . Smoking status: Current Every Day Smoker -- 0.25 packs/day for 10 years    Types: Cigarettes  . Smokeless tobacco: Never Used  . Alcohol Use: Yes     Comment: occaisional    OB History    Gravida Para Term Preterm AB TAB SAB Ectopic Multiple Living   3 0   1  1   0     Review of Systems  Unable to perform ROS: Mental status change      Allergies  Review of patient's allergies indicates no known allergies.  Home Medications   Prior to Admission medications   Medication Sig Start Date End Date Taking? Authorizing Provider  metroNIDAZOLE (FLAGYL) 500 MG tablet Take 1  tablet (500 mg total) by mouth 2 (two) times daily. Patient not taking: Reported on 08/29/2015 08/19/15   Marlis Edelson, CNM   BP 107/76 mmHg  Pulse 102  Temp(Src) 98.2 F (36.8 C) (Oral)  Resp 20  SpO2 98%  LMP 01/26/2015 Physical Exam Physical Exam  Nursing note and vitals reviewed. Constitutional: Well developed, well nourished, non-toxic, moaning on ED gurney in hallway, tossing and turning Head: Normocephalic and atraumatic.  Mouth/Throat: Oropharynx is clear and moist.  Neck: Normal range of motion. Neck supple.  Cardiovascular: Tachycardic rate and regular rhythm.  No LE edema. Pulmonary/Chest: Effort normal. No accessory muscle usage. Scattered expiratory wheezing. Abdominal: Soft. Gravid. There is no tenderness. There is no rebound and no guarding.  Musculoskeletal: Normal range of motion.  Neurological: Awake and alert, but seems altered, inattentive and poorly directable, moves all extremities symmetrically, no facial droop Skin: Skin is warm and dry.  Psychiatric: Cooperative  ED Course  Procedures (including critical care time) Labs Review Labs Reviewed  CBC WITH DIFFERENTIAL/PLATELET - Abnormal; Notable for the following:    WBC 11.6 (*)    RBC 3.74 (*)    HCT 35.8 (*)    Neutro Abs 8.5 (*)    All other components within normal limits  BASIC METABOLIC PANEL - Abnormal; Notable for the following:    CO2  19 (*)    BUN <5 (*)    All other components within normal limits  URINE RAPID DRUG SCREEN, HOSP PERFORMED - Abnormal; Notable for the following:    Cocaine POSITIVE (*)    All other components within normal limits  ETHANOL  I-STAT TROPOININ, ED    Imaging Review Dg Chest 2 View  08/29/2015  CLINICAL DATA:  Lightheadedness, headache and nausea with nonproductive cough after smoking cigarettes which was questionably laced with something, 6 months pregnant, asthma, has been smoking cigarettes and marijuana EXAM: CHEST  2 VIEW COMPARISON:  11/16/2014 FINDINGS:  Upper normal heart size consistent with pregnancy. Mediastinal contours and pulmonary vascularity normal. Atelectasis versus infiltrate at RIGHT middle lobe. Remaining lungs clear. No pleural effusion or pneumothorax. Bones unremarkable. IMPRESSION: Atelectasis versus infiltrate at RIGHT lobe. Electronically Signed   By: Ulyses SouthwardMark  Boles M.D.   On: 08/29/2015 15:04   I have personally reviewed and evaluated these images and lab results as part of my medical decision-making.   EKG Interpretation None      MDM   Final diagnoses:  Chest pain, unspecified chest pain type  Shortness of breath    30 year old female 1228 weeks GA with asthma and polysubstance abuse who presents with sob, chest pain, and ams after smoking possible drug laced cigarette. C/f possible cocaine abuse as she has history of consistent usage during pregnancy on chart review. She is on room air, breathing comfortably with normal oxygenation. With scattered wheezes throughout, ? Flaring up asthma after smoking. CXR with possible right middle lobe infiltrate. Given poor historian with ? Productive cough recently, covered with azithromycin, amoxicillin and treated for asthma with breathing treatment and prednisone. Pending UDS, as I suspect that presentation more likely due to cocaine abuse. Less concern for PE in this setting. Pending blood work and UDS and signed out to oncoming provider.     Lavera Guiseana Duo Liu, MD 08/29/15 [phone]

## 2015-08-29 NOTE — Discharge Instructions (Signed)

## 2015-08-29 NOTE — ED Notes (Signed)
Pt very resistant to care/uncooperative.  Pt given food sandwhich and sprite.

## 2015-08-29 NOTE — ED Notes (Signed)
Pt's father at bedside and pt gives me permission to talk to him about why she is here.

## 2015-08-29 NOTE — ED Provider Notes (Signed)
Patient was ambulating the hall on room air and oxygen saturation was 92-93%.  Patient had some scattered wheezes.  At rest her O2 sats saturation was 94-96%.  Patient was given IV Rocephin and by mouth Zithromax.  Was started on by mouth Zithromax.  Instructed to not do any more cocaine and return to emergency department if condition worsens.  Monica Nayobert Celine Dishman, MD 08/29/15 [phone]
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
## 2015-08-31 ENCOUNTER — Encounter: Payer: Medicaid Other | Admitting: Obstetrics and Gynecology

## 2015-08-31 ENCOUNTER — Encounter: Payer: Self-pay | Admitting: Obstetrics and Gynecology

## 2015-10-01 ENCOUNTER — Inpatient Hospital Stay (HOSPITAL_COMMUNITY)
Admission: AD | Admit: 2015-10-01 | Discharge: 2015-10-01 | Disposition: A | Discharge status: Home / Self Care | Payer: Medicaid Other | Source: Ambulatory Visit | Attending: Obstetrics and Gynecology | Admitting: Obstetrics and Gynecology

## 2015-10-01 ENCOUNTER — Encounter (HOSPITAL_COMMUNITY): Payer: Self-pay

## 2015-10-01 DIAGNOSIS — A599 Trichomoniasis, unspecified: Secondary | ICD-10-CM

## 2015-10-01 DIAGNOSIS — Z833 Family history of diabetes mellitus: Secondary | ICD-10-CM | POA: Diagnosis not present

## 2015-10-01 DIAGNOSIS — F1410 Cocaine abuse, uncomplicated: Secondary | ICD-10-CM | POA: Diagnosis not present

## 2015-10-01 DIAGNOSIS — F17210 Nicotine dependence, cigarettes, uncomplicated: Secondary | ICD-10-CM | POA: Diagnosis not present

## 2015-10-01 DIAGNOSIS — O26893 Other specified pregnancy related conditions, third trimester: Secondary | ICD-10-CM | POA: Insufficient documentation

## 2015-10-01 DIAGNOSIS — Z3A33 33 weeks gestation of pregnancy: Secondary | ICD-10-CM | POA: Diagnosis not present

## 2015-10-01 DIAGNOSIS — Z809 Family history of malignant neoplasm, unspecified: Secondary | ICD-10-CM | POA: Diagnosis not present

## 2015-10-01 DIAGNOSIS — F410 Panic disorder [episodic paroxysmal anxiety] without agoraphobia: Secondary | ICD-10-CM | POA: Diagnosis not present

## 2015-10-01 DIAGNOSIS — J45909 Unspecified asthma, uncomplicated: Secondary | ICD-10-CM | POA: Diagnosis not present

## 2015-10-01 DIAGNOSIS — A5901 Trichomonal vulvovaginitis: Secondary | ICD-10-CM

## 2015-10-01 DIAGNOSIS — O98313 Other infections with a predominantly sexual mode of transmission complicating pregnancy, third trimester: Secondary | ICD-10-CM | POA: Diagnosis not present

## 2015-10-01 LAB — URINALYSIS, ROUTINE W REFLEX MICROSCOPIC
Bilirubin Urine: NEGATIVE
GLUCOSE, UA: NEGATIVE mg/dL
Ketones, ur: 15 mg/dL — AB
Nitrite: NEGATIVE
PROTEIN: NEGATIVE mg/dL
SPECIFIC GRAVITY, URINE: 1.025 (ref 1.005–1.030)
pH: 6 (ref 5.0–8.0)

## 2015-10-01 LAB — RAPID URINE DRUG SCREEN, HOSP PERFORMED
AMPHETAMINES: NOT DETECTED
BENZODIAZEPINES: NOT DETECTED
Barbiturates: NOT DETECTED
Cocaine: POSITIVE — AB
Opiates: NOT DETECTED
TETRAHYDROCANNABINOL: POSITIVE — AB

## 2015-10-01 LAB — URINE MICROSCOPIC-ADD ON: RBC / HPF: NONE SEEN RBC/hpf (ref 0–5)

## 2015-10-01 LAB — WET PREP, GENITAL
CLUE CELLS WET PREP: NONE SEEN
Sperm: NONE SEEN
TRICH WET PREP: NONE SEEN

## 2015-10-01 LAB — GC/CHLAMYDIA PROBE AMP (~~LOC~~) NOT AT ARMC
Chlamydia: NEGATIVE
Neisseria Gonorrhea: NEGATIVE

## 2015-10-01 MED ORDER — METRONIDAZOLE 500 MG PO TABS
2000.0000 mg | ORAL_TABLET | Freq: Once | ORAL | Status: AC
  Administered 2015-10-01: 2000 mg via ORAL
  Filled 2015-10-01: qty 4

## 2015-10-01 MED ORDER — FLUCONAZOLE 150 MG PO TABS
150.0000 mg | ORAL_TABLET | Freq: Once | ORAL | Status: AC
  Administered 2015-10-01: 150 mg via ORAL
  Filled 2015-10-01: qty 1

## 2015-10-01 NOTE — Discharge Instructions (Signed)
Trichomoniasis Trichomoniasis is an infection caused by an organism called Trichomonas. The infection can affect both women and men. In women, the outer female genitalia and the vagina are affected. In men, the penis is mainly affected, but the prostate and other reproductive organs can also be involved. Trichomoniasis is a sexually transmitted infection (STI) and is most often passed to another person through sexual contact.  RISK FACTORS  Having unprotected sexual intercourse.  Having sexual intercourse with an infected partner. SIGNS AND SYMPTOMS  Symptoms of trichomoniasis in women include:  Abnormal gray-green frothy vaginal discharge.  Itching and irritation of the vagina.  Itching and irritation of the area outside the vagina. Symptoms of trichomoniasis in men include:   Penile discharge with or without pain.  Pain during urination. This results from inflammation of the urethra. DIAGNOSIS  Trichomoniasis may be found during a Pap test or physical exam. Your health care provider may use one of the following methods to help diagnose this infection:  Testing the pH of the vagina with a test tape.  Using a vaginal swab test that checks for the Trichomonas organism. A test is available that provides results within a few minutes.  Examining a urine sample.  Testing vaginal secretions. Your health care provider may test you for other STIs, including HIV. TREATMENT   You may be given medicine to fight the infection. Women should inform their health care provider if they could be or are pregnant. Some medicines used to treat the infection should not be taken during pregnancy.  Your health care provider may recommend over-the-counter medicines or creams to decrease itching or irritation.  Your sexual partner will need to be treated if infected.  Your health care provider may test you for infection again 3 months after treatment. HOME CARE INSTRUCTIONS   Take medicines only as  directed by your health care provider.  Take over-the-counter medicine for itching or irritation as directed by your health care provider.  Do not have sexual intercourse while you have the infection.  Women should not douche or wear tampons while they have the infection.  Discuss your infection with your partner. Your partner may have gotten the infection from you, or you may have gotten it from your partner.  Have your sex partner get examined and treated if necessary.  Practice safe, informed, and protected sex.  See your health care provider for other STI testing. SEEK MEDICAL CARE IF:   You still have symptoms after you finish your medicine.  You develop abdominal pain.  You have pain when you urinate.  You have bleeding after sexual intercourse.  You develop a rash.  Your medicine makes you sick or makes you throw up (vomit). MAKE SURE YOU:  Understand these instructions.  Will watch your condition.  Will get help right away if you are not doing well or get worse.   This information is not intended to replace advice given to you by your health care provider. Make sure you discuss any questions you have with your health care provider.   Document Released: 12/07/2000 Document Revised: 07/04/2014 Document Reviewed: 03/25/2013 Elsevier Interactive Patient Education 2016 Elsevier Inc.  
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
## 2015-10-01 NOTE — MAU Note (Signed)
Pt presents via EMS complaining of possible SROM at 2200. Also having contractions every 10 minutes.

## 2015-10-02 LAB — URINE CULTURE: SPECIAL REQUESTS: NORMAL

## 2015-10-04 ENCOUNTER — Emergency Department (HOSPITAL_COMMUNITY)
Admission: EM | Admit: 2015-10-04 | Discharge: 2015-10-04 | Disposition: A | Discharge status: Home / Self Care | Payer: Medicaid Other | Attending: Emergency Medicine | Admitting: Emergency Medicine

## 2015-10-04 ENCOUNTER — Encounter (HOSPITAL_COMMUNITY): Payer: Self-pay | Admitting: Nurse Practitioner

## 2015-10-04 DIAGNOSIS — R000 Tachycardia, unspecified: Secondary | ICD-10-CM | POA: Insufficient documentation

## 2015-10-04 DIAGNOSIS — K002 Abnormalities of size and form of teeth: Secondary | ICD-10-CM | POA: Diagnosis not present

## 2015-10-04 DIAGNOSIS — J45909 Unspecified asthma, uncomplicated: Secondary | ICD-10-CM | POA: Diagnosis not present

## 2015-10-04 DIAGNOSIS — F17210 Nicotine dependence, cigarettes, uncomplicated: Secondary | ICD-10-CM | POA: Insufficient documentation

## 2015-10-04 DIAGNOSIS — O9989 Other specified diseases and conditions complicating pregnancy, childbirth and the puerperium: Secondary | ICD-10-CM | POA: Diagnosis not present

## 2015-10-04 DIAGNOSIS — K0889 Other specified disorders of teeth and supporting structures: Secondary | ICD-10-CM | POA: Insufficient documentation

## 2015-10-04 DIAGNOSIS — Z3A36 36 weeks gestation of pregnancy: Secondary | ICD-10-CM | POA: Insufficient documentation

## 2015-10-04 DIAGNOSIS — F419 Anxiety disorder, unspecified: Secondary | ICD-10-CM | POA: Diagnosis not present

## 2015-10-04 DIAGNOSIS — O99513 Diseases of the respiratory system complicating pregnancy, third trimester: Secondary | ICD-10-CM | POA: Diagnosis not present

## 2015-10-04 DIAGNOSIS — O99333 Smoking (tobacco) complicating pregnancy, third trimester: Secondary | ICD-10-CM | POA: Insufficient documentation

## 2015-10-04 DIAGNOSIS — O99613 Diseases of the digestive system complicating pregnancy, third trimester: Secondary | ICD-10-CM | POA: Diagnosis not present

## 2015-10-04 DIAGNOSIS — K1379 Other lesions of oral mucosa: Secondary | ICD-10-CM

## 2015-10-04 DIAGNOSIS — O99343 Other mental disorders complicating pregnancy, third trimester: Secondary | ICD-10-CM | POA: Diagnosis not present

## 2015-10-04 MED ORDER — LIDOCAINE VISCOUS 2 % MT SOLN
15.0000 mL | Freq: Once | OROMUCOSAL | Status: AC
  Administered 2015-10-04: 15 mL via OROMUCOSAL
  Filled 2015-10-04: qty 15

## 2015-10-04 MED ORDER — BUSPIRONE HCL 15 MG PO TABS
7.5000 mg | ORAL_TABLET | Freq: Once | ORAL | Status: AC
  Administered 2015-10-04: 7.5 mg via ORAL
  Filled 2015-10-04 (×2): qty 1

## 2015-10-04 NOTE — ED Provider Notes (Signed)
History  By signing my name below, I, Karle Plumber, attest that this documentation has been prepared under the direction and in the presence of [employer], PA-C. Electronically Signed: Karle Plumber, ED Scribe. 10/04/2015. 7:51 PM  Chief Complaint  Patient presents with  . Anxiety   The history is provided by the patient and medical records. No language interpreter was used.    HPI Comments:  Monica Holloway is a 30 y.o. female at [redacted] weeks gestation, brought in by EMS, with PMHx of anxiety and cocaine abuse during pregnancy who presents to the Emergency Department complaining of severe burning pain in her roof of the mouth that began this morning. She has not done anything to treat the pain. Eating increases the pain. She denies alleviating factors. She denies any injury to the mouth, dental pain, sore throat. She denies any prenatal care. She denies vaginal discharge, vaginal bleeding, contractions or any complaints pregnancy related. She denies any recent cocaine or other illicit drug use.  Past Medical History  Diagnosis Date  . Asthma   . Panic disorder   . Panic attacks    Past Surgical History  Procedure Laterality Date  . No past surgeries     Family History  Problem Relation Age of Onset  . Cancer Mother   . Diabetes Father    Social History  Substance Use Topics  . Smoking status: Current Every Day Smoker -- 0.25 packs/day for 10 years    Types: Cigarettes  . Smokeless tobacco: Never Used  . Alcohol Use: Yes     Comment: occaisional    OB History    Gravida Para Term Preterm AB TAB SAB Ectopic Multiple Living   2 0   1  1   0     Review of Systems A complete 10 system review of systems was obtained and all systems are negative except as noted in the HPI and PMH.   Allergies  Review of patient's allergies indicates no known allergies.  Home Medications   Prior to Admission medications   Medication Sig Start Date End Date Taking? Authorizing  Provider  azithromycin (ZITHROMAX) 250 MG tablet Take 1 tablet daily until gone starting tomorrow 08/29/15   Nelva Nay, MD   Triage Vitals: BP 104/81 mmHg  Pulse 112  Temp(Src) 98.4 F (36.9 C) (Oral)  Resp 16  Ht  (1.6 m)  Wt 167 lb (75.751 kg)  BMI 29.59 kg/m2  SpO2 99%  LMP 01/26/2015 Physical Exam  Constitutional: She is oriented to person, place, and time. She appears well-developed and well-nourished.  HENT:  Head: Normocephalic and atraumatic.  Mouth/Throat: Uvula is midline, oropharynx is clear and moist and mucous membranes are normal. No trismus in the jaw. No uvula swelling. No oropharyngeal exudate, posterior oropharyngeal edema, posterior oropharyngeal erythema or tonsillar abscesses.  No erythema or lesions to soft or hard palate. Diffusely poor dentition, no sign of dental abscess visualized or palpated.   No sublingual tenderness or swelling   Eyes: EOM are normal.  Neck: Normal range of motion.  Cardiovascular: Regular rhythm and normal heart sounds.  Tachycardia present.   Pulmonary/Chest: Effort normal and breath sounds normal. No respiratory distress.  Abdominal:  Gravid abdomen  Musculoskeletal: Normal range of motion.  Neurological: She is alert and oriented to person, place, and time.  Skin: Skin is warm and dry.  Psychiatric: She has a normal mood and affect. Her behavior is normal.  Nursing note and vitals reviewed.   ED  Course  Procedures (including critical care time) DIAGNOSTIC STUDIES: Oxygen Saturation is 99% on RA, normal by my interpretation.   COORDINATION OF CARE: 6:53 PM- Will order Buspirone to treat anxiety. Pt verbalizes understanding and agrees to plan.  Medications  busPIRone (BUSPAR) tablet 7.5 mg (7.5 mg Oral Given 10/04/15 1931)  lidocaine (XYLOCAINE) 2 % viscous mouth solution 15 mL (15 mLs Mouth/Throat Given 10/04/15 1931)    Labs Review Labs Reviewed - No data to display  Imaging Review No results found. I have  personally reviewed and evaluated these images and lab results as part of my medical decision-making.   EKG Interpretation None     Today's Vitals   10/04/15 1816 10/04/15 1825 10/04/15 1954 10/04/15 2004  BP: 104/81  132/67   Pulse: 112  103   Temp: 98.4 F (36.9 C)  98.8 F (37.1 C)   TempSrc: Oral  Oral   Resp: 16  18   Height: 5\' 3"  (1.6 m)     Weight: 75.751 kg     SpO2: 99%  100%   PainSc:  8   9    MDM   Final diagnoses:  None  Patient with a history of Panic Attacks presents today with anxiety and oral pain.  Patient appears anxious and agitated upon arrival in the ED.  She states that she feels like she is having a panic attack.  Since she is [redacted] weeks pregnant the pharmacist was consulted to determine a medication that would be safe to give during pregnancy.  Pharmacist recommended Buspar.  Patient given medication with slight improvement in symptoms.  She denies SI or HI.  Patient also complaining of oral pain.  Pain located on the roof of her mouth.  No injury or trauma.  No lesions visualized.  No signs of infection.  Oropharynx is clear and moist.  She has poor dentition, but no signs of dental abscess.  No identifiable cause for the oral pain.  Patient given viscous lidocaine, but did not feel that it helped.  Patient is [redacted] weeks pregnant.  However, she did not have any complaints associated with pregnancy.  No abdominal pain, pelvic pain, contractions, vaginal bleeding, or any other complaints.  Feel that the patient is stable for discharge.  Return precautions given. I personally performed the services described in this documentation, which was scribed in my presence. The recorded information has been reviewed and is accurate.     Santiago GladHeather Tarek Cravens, PA-C 10/05/15 0144  Marily MemosJason Mesner, MD 10/06/15 [phone]
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
## 2015-10-04 NOTE — ED Notes (Signed)
She c/o feeling anxious today, she has a history of anxiety and does not take anything for it. She also c/o severe oral pain states :"my whole mouth hurts." she is [redacted] weeks pregnant. She denies any complaints related to pregnancy, denies vaginal discharge or bleeding, n/v, abd/pelvic/back pain

## 2015-10-04 NOTE — Discharge Instructions (Signed)
Return to the Emergency Department if you have any thoughts of hurting self or others [facility] Resource [facility] - [facility] Centers    Other Local Resources (Updated 06/2015)  Urgent Care Centers   Hours of Operation and Other Information    Address and Phone Number  [facility]  Hours: Monday - Friday, 7 am - 8 pm, Saturday and Sunday, 8 am - 4 pm  Accepts Medicare, Medicaid, and private insurance  Offers sliding scale for uninsured [phone] [address] [place], [place] [address]   [facility], [place]    Hours: Monday - Friday, 8 am - 8 pm, Saturday 9 am - 6 pm, and Sunday 11 am - 6 pm  Accepts Medicare, Medicaid, and private insurance  Offers sliding scale for uninsured [phone] [address], [address] [place], [place] [address]  [facility]   Hours: Monday - Sunday, 1 pm - 8 pm  Accepts Medicare, Medicaid, and private insurance  Offers sliding scale for uninsured [phone] [address] [place], [place] [address]   [facility]   Hours: Monday - Thursday, 8 am - 8:30 pm, Friday 8 am - 6 pm, Saturday and Sunday 8 am - 4 pm  Accepts Medicare, Medicaid, and private insurance  Offers sliding scale for uninsured [phone] [address], [place], [place]

## 2015-10-05 ENCOUNTER — Inpatient Hospital Stay (HOSPITAL_COMMUNITY)
Admission: EM | Admit: 2015-10-05 | Discharge: 2015-10-05 | Disposition: A | Discharge status: Home / Self Care | Payer: Medicaid Other | Attending: Obstetrics & Gynecology | Admitting: Obstetrics & Gynecology

## 2015-10-05 ENCOUNTER — Encounter (HOSPITAL_COMMUNITY): Payer: Self-pay | Admitting: Emergency Medicine

## 2015-10-05 DIAGNOSIS — A5901 Trichomonal vulvovaginitis: Secondary | ICD-10-CM

## 2015-10-05 DIAGNOSIS — K1379 Other lesions of oral mucosa: Secondary | ICD-10-CM

## 2015-10-05 DIAGNOSIS — Z3A34 34 weeks gestation of pregnancy: Secondary | ICD-10-CM

## 2015-10-05 DIAGNOSIS — F1410 Cocaine abuse, uncomplicated: Secondary | ICD-10-CM | POA: Diagnosis not present

## 2015-10-05 DIAGNOSIS — O4700 False labor before 37 completed weeks of gestation, unspecified trimester: Secondary | ICD-10-CM

## 2015-10-05 DIAGNOSIS — F1910 Other psychoactive substance abuse, uncomplicated: Secondary | ICD-10-CM | POA: Diagnosis not present

## 2015-10-05 DIAGNOSIS — O23593 Infection of other part of genital tract in pregnancy, third trimester: Secondary | ICD-10-CM

## 2015-10-05 DIAGNOSIS — O99330 Smoking (tobacco) complicating pregnancy, unspecified trimester: Secondary | ICD-10-CM | POA: Insufficient documentation

## 2015-10-05 DIAGNOSIS — O99323 Drug use complicating pregnancy, third trimester: Secondary | ICD-10-CM

## 2015-10-05 DIAGNOSIS — O23599 Infection of other part of genital tract in pregnancy, unspecified trimester: Secondary | ICD-10-CM

## 2015-10-05 DIAGNOSIS — O479 False labor, unspecified: Secondary | ICD-10-CM

## 2015-10-05 LAB — URINALYSIS, ROUTINE W REFLEX MICROSCOPIC
Bilirubin Urine: NEGATIVE
GLUCOSE, UA: NEGATIVE mg/dL
Hgb urine dipstick: NEGATIVE
Ketones, ur: 40 mg/dL — AB
LEUKOCYTES UA: NEGATIVE
Nitrite: NEGATIVE
PH: 6.5 (ref 5.0–8.0)
PROTEIN: NEGATIVE mg/dL
Specific Gravity, Urine: 1.01 (ref 1.005–1.030)

## 2015-10-05 LAB — RAPID URINE DRUG SCREEN, HOSP PERFORMED
AMPHETAMINES: NOT DETECTED
BENZODIAZEPINES: NOT DETECTED
Barbiturates: NOT DETECTED
Cocaine: POSITIVE — AB
Opiates: NOT DETECTED
Tetrahydrocannabinol: NOT DETECTED

## 2015-10-05 LAB — GC/CHLAMYDIA PROBE AMP (~~LOC~~) NOT AT ARMC
CHLAMYDIA, DNA PROBE: NEGATIVE
NEISSERIA GONORRHEA: NEGATIVE

## 2015-10-05 LAB — RAPID STREP SCREEN (MED CTR MEBANE ONLY): STREPTOCOCCUS, GROUP A SCREEN (DIRECT): NEGATIVE

## 2015-10-05 MED ORDER — LACTATED RINGERS IV BOLUS (SEPSIS)
1000.0000 mL | Freq: Once | INTRAVENOUS | Status: AC
  Administered 2015-10-05: 1000 mL via INTRAVENOUS

## 2015-10-05 MED ORDER — BENZOCAINE (TOPICAL) 20 % EX AERO: INHALATION_SPRAY | Freq: Four times a day (QID) | CUTANEOUS | Status: DC | PRN

## 2015-10-05 MED ORDER — NYSTATIN 100000 UNIT/ML MT SUSP: 5.0000 mL | Freq: Two times a day (BID) | OROMUCOSAL | Status: DC

## 2015-10-05 MED ORDER — METRONIDAZOLE 500 MG PO TABS
2000.0000 mg | ORAL_TABLET | Freq: Once | ORAL | Status: AC
  Administered 2015-10-05: 2000 mg via ORAL
  Filled 2015-10-05: qty 4

## 2015-10-05 MED ORDER — NALBUPHINE HCL 10 MG/ML IJ SOLN
10.0000 mg | Freq: Once | INTRAMUSCULAR | Status: AC
  Administered 2015-10-05: 10 mg via INTRAVENOUS
  Filled 2015-10-05: qty 1

## 2015-10-05 MED ORDER — BUTAMBEN-TETRACAINE-BENZOCAINE 2-2-14 % EX AERO
1.0000 | INHALATION_SPRAY | Freq: Once | CUTANEOUS | Status: DC
  Filled 2015-10-05: qty 20

## 2015-10-05 MED ORDER — FENTANYL CITRATE (PF) 100 MCG/2ML IJ SOLN
50.0000 ug | INTRAMUSCULAR | Status: DC | PRN
  Administered 2015-10-05: 50 ug via INTRAVENOUS
  Filled 2015-10-05: qty 2

## 2015-10-05 MED ORDER — BENZOCAINE (TOPICAL) 20 % EX AERO
INHALATION_SPRAY | Freq: Four times a day (QID) | CUTANEOUS | Status: DC | PRN
  Administered 2015-10-05: 1 via OROMUCOSAL
  Filled 2015-10-05 (×3): qty 57

## 2015-10-05 MED ORDER — NIFEDIPINE 10 MG PO CAPS
10.0000 mg | ORAL_CAPSULE | ORAL | Status: AC
  Administered 2015-10-05: 10 mg via ORAL
  Filled 2015-10-05: qty 1

## 2015-10-05 MED ORDER — NYSTATIN 100000 UNIT/ML MT SUSP
5.0000 mL | Freq: Once | OROMUCOSAL | Status: AC
  Administered 2015-10-05: 500000 [IU] via ORAL
  Filled 2015-10-05 (×2): qty 5

## 2015-10-05 MED ORDER — DEXTROSE 5 % IV SOLN
1.0000 g | Freq: Once | INTRAVENOUS | Status: AC
  Administered 2015-10-05: 1 g via INTRAVENOUS
  Filled 2015-10-05: qty 10

## 2015-10-05 MED ORDER — BETAMETHASONE SOD PHOS & ACET 6 (3-3) MG/ML IJ SUSP
12.0000 mg | INTRAMUSCULAR | Status: DC
  Administered 2015-10-05: 12 mg via INTRAMUSCULAR
  Filled 2015-10-05: qty 2

## 2015-10-05 MED ORDER — AZITHROMYCIN 250 MG PO TABS
1000.0000 mg | ORAL_TABLET | Freq: Once | ORAL | Status: AC
  Administered 2015-10-05: 1000 mg via ORAL
  Filled 2015-10-05: qty 4

## 2015-10-05 MED ORDER — ONDANSETRON HCL 4 MG/2ML IJ SOLN
4.0000 mg | Freq: Once | INTRAMUSCULAR | Status: AC
  Administered 2015-10-05: 4 mg via INTRAVENOUS
  Filled 2015-10-05: qty 2

## 2015-10-05 NOTE — MAU Provider Note (Signed)
History     CSN: [id number]  Arrival date and time: 10/05/15 0141   First Provider Initiated Contact with Patient 10/05/15 [phone]      Chief Complaint  Patient presents with  . Dental Pain  . Abdominal Pain   HPI Patient is 30 y.o. G2P0010 4415w1d here with complaints of severe burning oral pain.  She notes that she was [facility] this.  She was noted to have +Trich on 4/6.  Unclear as to whether she was treated.  [facility] with Rocephin, Azithromycin and Flagyl and [facility] for contractions that were appreciated on monitor.  She also received nubain and procardia x1.  She reports that she is not feeling her contractions.  +FM, denies LOF, VB.  OB History    Gravida Para Term Preterm AB TAB SAB Ectopic Multiple Living   2 0   1  1   0      Past Medical History  Diagnosis Date  . Asthma   . Panic disorder   . Panic attacks     Past Surgical History  Procedure Laterality Date  . No past surgeries      Family History  Problem Relation Age of Onset  . Cancer Mother   . Diabetes Father     Social History  Substance Use Topics  . Smoking status: Current Every Day Smoker -- 0.25 packs/day for 10 years    Types: Cigarettes  . Smokeless tobacco: Never Used  . Alcohol Use: Yes     Comment: occaisional     Allergies: No Known Allergies  Prescriptions prior to admission  Medication Sig Dispense Refill Last Dose  . Prenatal Vit-Fe Fumarate-FA (MULTIVITAMIN-PRENATAL) 27-0.8 MG TABS tablet Take 1 tablet by mouth daily at 12 noon.   2 weeks ago  . azithromycin (ZITHROMAX) 250 MG tablet Take 1 tablet daily until gone starting tomorrow (Patient not taking: Reported on 10/05/2015) 4 each 0     Review of Systems  Constitutional: Negative for fever and chills.  HENT:       +oral pain  Gastrointestinal: Negative for nausea and vomiting.  Genitourinary: Negative for dysuria.  Skin: Negative for rash.   Physical Exam   Blood pressure 129/67, pulse  107, temperature 98.3 F (36.8 C), temperature source Oral, resp. rate 20, last menstrual period 01/26/2015, SpO2 95 %, unknown if currently breastfeeding.  Physical Exam  Constitutional: She is oriented to person, place, and time. She appears well-developed and well-nourished.  HENT:  Mouth/Throat: Oropharynx is clear and moist. No oropharyngeal exudate.  No mucosal lesions appreciated on exam  Eyes: Conjunctivae and EOM are normal. Pupils are equal, round, and reactive to light.  Neck: Normal range of motion.  Cardiovascular: Normal rate, regular rhythm, normal heart sounds and intact distal pulses.   No murmur heard. Respiratory: Effort normal and breath sounds normal. No respiratory distress.  GI: Soft.  gravid  Musculoskeletal: Normal range of motion. She exhibits no edema.  Neurological: She is alert and oriented to person, place, and time.  Skin: Skin is warm. No rash noted. She is not diaphoretic.   Dilation: 1.5 Effacement (%): 60 Cervical Position: Posterior Station: -2 Presentation: Vertex Exam by:: Dr Nadine CountsGottschalk  Fetal monitoringBaseline: 135 bpm, Variability: Good {> 6 bpm) and Accelerations: Reactive Uterine activity: uterine irritability   MAU Course  Procedures  MDM 0600: Patient experiencing severe oral pain.  Will give Fentanyl 50 IV, may repeat if needed.  No contractions on  monitor.  Will continue to monitor.  Assessment and Plan   Premature uterine contractions: None on monitor here in MAU.  Will continue to monitor for another few hours and recheck cervix.  Plan to repeat Procardia if needed.  Only 1 dose of Procardia  given at [facility].  Patient will need to return/ follow up in office for second dose of BMZ  Mouth pain: No oral lesions appreciated but concern for Trich in oral cavity causing pain.  Fentanyl given with moderate relief.  Has hurricane spray ordered. S/p Rocephin, Flagyl and Azithromycin - Consider MMW w/ lidocaine swish and spit if no  relief with above   Patient signed out to oncoming daytime physician Willadean Carol, MD.   Delynn Flavin, DO 10/05/2015, 6:01 AM
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
## 2015-10-05 NOTE — MAU Provider Note (Signed)
History     CSN: [id number]  Arrival date and time: 10/05/15 0141   First Provider Initiated Contact with Patient 10/05/15 [phone]      Chief Complaint  Patient presents with  . Dental Pain  . Abdominal Pain   HPI  Pt presented overnight as a transfer from [facility]. She came to the [facility] complaining of severe oral burning pain. She was recently seen on 4/6 in the [facility] and noted to have Trichomonas, but it was unclear if she was treated for this. Regarding [facility]- reviewed record for 4/6. She received metronidazole 2000mg  PO X1 on 4/6 and was [facility] again [facility] on [facility]10 (again 2000mg  PO x1). At [facility], she was noted to have contractions so was transferred to MAU for further management. She was given Nubain x 1 and Procardia x 1 and her contractions stopped. She was also treated with Hurricane oral spray for her oral pain, but has not had any relief with this.  She endorses lower abdominal constant "crampy" pain for the last 2 days that is worse with walking and standing and better with rest.  Endorses fetal movement, no vaginal bleeding, no leakage of fluids. No fevers, no chills, no chest pain, no shortness of breath, no lower extremity swelling. Her last BM was yesterday and was normal. Denies changes in vaginal discharge, vaginal itchiness, or dysuria.  She also has a history of UDS positive for cocaine and THC throughout pregnancy. She also endorses alcohol use during pregnancy.   Past Medical History  Diagnosis Date  . Asthma   . Panic disorder   . Panic attacks     Past Surgical History  Procedure Laterality Date  . No past surgeries      Family History  Problem Relation Age of Onset  . Cancer Mother   . Diabetes Father     Social History  Substance Use Topics  . Smoking status: Current Every Day Smoker -- 0.25 packs/day for 10 years    Types: Cigarettes  . Smokeless tobacco: Never Used  . Alcohol Use: Yes     Comment: occaisional     Allergies: No  Known Allergies  Prescriptions prior to admission  Medication Sig Dispense Refill Last Dose  . Prenatal Vit-Fe Fumarate-FA (MULTIVITAMIN-PRENATAL) 27-0.8 MG TABS tablet Take 1 tablet by mouth daily at 12 noon.   2 weeks ago  . azithromycin (ZITHROMAX) 250 MG tablet Take 1 tablet daily until gone starting tomorrow (Patient not taking: Reported on 10/05/2015) 4 each 0     Review of Systems  Constitutional: Negative for fever and chills.  HENT: Positive for sore throat.   Eyes: Negative for blurred vision.  Respiratory: Negative for shortness of breath.   Cardiovascular: Negative for chest pain.  Gastrointestinal: Positive for abdominal pain. Negative for heartburn, nausea and vomiting.  Genitourinary: Negative for dysuria.  Musculoskeletal: Negative for myalgias.  Skin: Negative for rash.  Neurological: Negative for dizziness, weakness and headaches.  Endo/Heme/Allergies: Negative for environmental allergies.   Physical Exam   Blood pressure 129/67, pulse 107, temperature 98.3 F (36.8 C), temperature source Oral, resp. rate 20, last menstrual period 01/26/2015, SpO2 95 %, unknown if currently breastfeeding.  Physical Exam  Nursing note and vitals reviewed. Constitutional: She is oriented to person, place, and time. She appears well-developed and well-nourished. No distress.  Eyes: No scleral icterus.  Neck: Normal range of motion.  Cardiovascular: Normal rate.   Respiratory: Effort normal.  GI: Soft. There is  tenderness. There is no rebound and no guarding.  gravid  Musculoskeletal: Normal range of motion. She exhibits no edema.  Neurological: She is alert and oriented to person, place, and time.  Skin: Skin is warm and dry. No rash noted.    MAU Course  Procedures  MDM Took over care from the night team. She was treated with Hurricane mouth spray, which has not helped. She is endorsing lower crampy abdominal pain.  Assessment and Plan  #Oral Pain: Unclear etiology. May  be secondary to STI vs candidiasis, although her oral exam is normal. - Will treat with Nystatin Swish and Swallow to see if this helps  #Abdominal Pain: Likely secondary to Trichomonas vs UTI vs musculoskeletal pain. Urine culture from 4/6 grew multiple species. Pt is s/p Flagyl 2g.  - Will order UA, urine culture, and UDS (given hx of +cocaine, +THC) - Pt advised to use Tylenol as needed for abdominal pain - Follow-up in clinic for routine OB visit.  Monica [facility]ay 10/05/2015, 9:28 AM   OB fellow attestation: I have seen and examined this patient; I agree with above documentation in the resident's note.   Monica [facility]ay is a 30 y.o. G2P0010 reporting mouth pain and contractions. Evaluated at [facility] and observed here for several hours.  +FM, denies LOF, VB, contractions, vaginal discharge.  PE: BP 129/67 mmHg  Pulse 107  Temp(Src) 98.3 F (36.8 C) (Oral)  Resp 20  SpO2 95%  LMP 01/26/2015 Gen: calm comfortable, NAD Resp: normal effort, no distress Abd: gravid  ROS, labs, PMH reviewed NST reactive   Plan: - Agree w/plan above.  - Mouth pain: nystatin swish and swallow, first dose here - UA- to r/o UTI - UDS to determine last use, per patient "was weeks ago"  Monica Flake, MD 10:12 AM

## 2015-10-05 NOTE — ED Notes (Signed)
Pt arrives to the ER via [facility] for complaints of mouth pain; pt states that [facility] same; pt states "my mouth feels like it is on fire"; pt also c/o left sided abd pain; pt denies contractions

## 2015-10-05 NOTE — ED Notes (Signed)
RROB spoke with Rutherford Guysarlene Lawson,CNM, told of pt history, uc pattern, sve; said that pt can be treated with  antibiotics and flagyl and may have nubain or percocet for mouth pain; continue to monitor for contractions, if they get more frequent, then [facility]; pt needs betamethasone now and will need to come to [facility] on 4/11 for a second dose

## 2015-10-05 NOTE — MAU Note (Signed)
Dr. Alvester MorinNewton & Dr. Nancy MarusMayo in to see pt.

## 2015-10-05 NOTE — ED Notes (Signed)
Bed: RESB Expected date:  Expected time:  Means of arrival:  Comments: EMS 6F

## 2015-10-05 NOTE — ED Notes (Signed)
Pt also c/o intermittent abd pain; pt states that the pain comes and goes

## 2015-10-05 NOTE — MAU Note (Signed)
Pt transferred via [facility] from [facility]. Pt has complaints of sore throat and contractions. Denies LOF or vag bleeding. +FM. RROB nurse did SVE and pt was 1-2cm dilated.

## 2015-10-05 NOTE — Discharge Instructions (Signed)
Braxton Hicks Contractions °Contractions of the uterus can occur throughout pregnancy. Contractions are not always a sign that you are in labor.  °WHAT ARE BRAXTON HICKS CONTRACTIONS?  °Contractions that occur before labor are called Braxton Hicks contractions, or false labor. Toward the end of pregnancy (32-34 weeks), these contractions can develop more often and may become more forceful. This is not true labor because these contractions do not result in opening (dilatation) and thinning of the cervix. They are sometimes difficult to tell apart from true labor because these contractions can be forceful and people have different pain tolerances. You should not feel embarrassed if you go to the hospital with false labor. Sometimes, the only way to tell if you are in true labor is for your health care provider to look for changes in the cervix. °If there are no prenatal problems or other health problems associated with the pregnancy, it is completely safe to be sent home with false labor and await the onset of true labor. °HOW CAN YOU TELL THE DIFFERENCE BETWEEN TRUE AND FALSE LABOR? °False Labor °· The contractions of false labor are usually shorter and not as hard as those of true labor.   °· The contractions are usually irregular.   °· The contractions are often felt in the front of the lower abdomen and in the groin.   °· The contractions may go away when you walk around or change positions while lying down.   °· The contractions get weaker and are shorter lasting as time goes on.   °· The contractions do not usually become progressively stronger, regular, and closer together as with true labor.   °True Labor °· Contractions in true labor last 30-70 seconds, become very regular, usually become more intense, and increase in frequency.   °· The contractions do not go away with walking.   °· The discomfort is usually felt in the top of the uterus and spreads to the lower abdomen and low back.   °· True labor can be  determined by your health care provider with an exam. This will show that the cervix is dilating and getting thinner.   °WHAT TO REMEMBER °· Keep up with your usual exercises and follow other instructions given by your health care provider.   °· Take medicines as directed by your health care provider.   °· Keep your regular prenatal appointments.   °· Eat and drink lightly if you think you are going into labor.   °· If Braxton Hicks contractions are making you uncomfortable:   °¨ Change your position from lying down or resting to walking, or from walking to resting.   °¨ Sit and rest in a tub of warm water.   °¨ Drink 2-3 glasses of water. Dehydration may cause these contractions.   °¨ Do slow and deep breathing several times an hour.   °WHEN SHOULD I SEEK IMMEDIATE MEDICAL CARE? °Seek immediate medical care if: °· Your contractions become stronger, more regular, and closer together.   °· You have fluid leaking or gushing from your vagina.   °· You have a fever.   °· You pass blood-tinged mucus.   °· You have vaginal bleeding.   °· You have continuous abdominal pain.   °· You have low back pain that you never had before.   °· You feel your baby's head pushing down and causing pelvic pressure.   °· Your baby is not moving as much as it used to.   °  °This information is not intended to replace advice given to you by your health care provider. Make sure you discuss any questions you have with your health care   provider.   Document Released: 06/13/2005 Document Revised: 06/18/2013 Document Reviewed: 03/25/2013 Elsevier Interactive Patient Education 2016 [employer]     Safe Medications in Pregnancy   Acne: Benzoyl Peroxide Salicylic Acid  Backache/Headache: Tylenol: 2 regular strength every 4 hours OR              2 Extra strength every 6 hours  Colds/Coughs/Allergies: Benadryl (alcohol free) 25 mg every 6 hours as needed Breath right strips Claritin Cepacol throat lozenges Chloraseptic throat  spray Cold-Eeze- up to three times per day Cough drops, alcohol free Flonase (by prescription only) Guaifenesin Mucinex Robitussin DM (plain only, alcohol free) Saline nasal spray/drops Sudafed (pseudoephedrine) & Actifed ** use only after [redacted] weeks gestation and if you do not have high blood pressure Tylenol Vicks Vaporub Zinc lozenges Zyrtec   Constipation: Colace Ducolax suppositories Fleet enema Glycerin suppositories Metamucil Milk of magnesia Miralax Senokot Smooth move tea  Diarrhea: Kaopectate Imodium A-D  *NO pepto Bismol  Hemorrhoids: Anusol Anusol HC Preparation H Tucks  Indigestion: Tums Maalox Mylanta Zantac  Pepcid  Insomnia: Benadryl (alcohol free) 25mg  every 6 hours as needed Tylenol PM Unisom, no Gelcaps  Leg Cramps: Tums MagGel  Nausea/Vomiting:  Bonine Dramamine Emetrol Ginger extract Sea bands Meclizine  Nausea medication to take during pregnancy:  Unisom (doxylamine succinate 25 mg tablets) Take one tablet daily at bedtime. If symptoms are not adequately controlled, the dose can be increased to a maximum recommended dose of two tablets daily (1/2 tablet in the morning, 1/2 tablet mid-afternoon and one at bedtime). Vitamin B6 100mg  tablets. Take one tablet twice a day (up to 200 mg per day).  Skin Rashes: Aveeno products Benadryl cream or 25mg  every 6 hours as needed Calamine Lotion 1% cortisone cream  Yeast infection: Gyne-lotrimin 7 Monistat 7   **If taking multiple medications, please check labels to avoid duplicating the same active ingredients **take medication as directed on the label ** Do not exceed 4000 mg of tylenol in 24 hours **Do not take medications that contain aspirin or ibuprofen

## 2015-10-05 NOTE — ED Notes (Signed)
Rapid OB called to advise of patient; pt on Fetal Monitor HR 143 initially

## 2015-10-05 NOTE — Progress Notes (Signed)
RROB spoke with Darlene Lawson,CNM and told of contractions every 2-585min; orders for procardia 10mg  now and to transfer pt to [facility].

## 2015-10-05 NOTE — ED Notes (Signed)
This note also relates to the following rows which could not be included: Pulse Rate - Cannot attach notes to unvalidated device data SpO2 - Cannot attach notes to unvalidated device data   RROB gave report to Debra,RNC [facility]

## 2015-10-06 ENCOUNTER — Inpatient Hospital Stay (HOSPITAL_COMMUNITY)
Admission: AD | Admit: 2015-10-06 | Discharge: 2015-10-06 | Disposition: A | Discharge status: Home / Self Care | Payer: Medicaid Other | Source: Ambulatory Visit | Attending: Obstetrics & Gynecology | Admitting: Obstetrics & Gynecology

## 2015-10-06 ENCOUNTER — Encounter (HOSPITAL_COMMUNITY): Payer: Self-pay | Admitting: Family Medicine

## 2015-10-06 DIAGNOSIS — O99323 Drug use complicating pregnancy, third trimester: Secondary | ICD-10-CM

## 2015-10-06 DIAGNOSIS — F1410 Cocaine abuse, uncomplicated: Secondary | ICD-10-CM | POA: Diagnosis not present

## 2015-10-06 DIAGNOSIS — F1910 Other psychoactive substance abuse, uncomplicated: Secondary | ICD-10-CM | POA: Insufficient documentation

## 2015-10-06 DIAGNOSIS — K1379 Other lesions of oral mucosa: Secondary | ICD-10-CM | POA: Insufficient documentation

## 2015-10-06 DIAGNOSIS — O99333 Smoking (tobacco) complicating pregnancy, third trimester: Secondary | ICD-10-CM | POA: Insufficient documentation

## 2015-10-06 DIAGNOSIS — O26893 Other specified pregnancy related conditions, third trimester: Secondary | ICD-10-CM | POA: Diagnosis present

## 2015-10-06 DIAGNOSIS — O9989 Other specified diseases and conditions complicating pregnancy, childbirth and the puerperium: Secondary | ICD-10-CM | POA: Diagnosis not present

## 2015-10-06 DIAGNOSIS — Z3A34 34 weeks gestation of pregnancy: Secondary | ICD-10-CM

## 2015-10-06 DIAGNOSIS — O0993 Supervision of high risk pregnancy, unspecified, third trimester: Secondary | ICD-10-CM | POA: Insufficient documentation

## 2015-10-06 DIAGNOSIS — F1490 Cocaine use, unspecified, uncomplicated: Secondary | ICD-10-CM

## 2015-10-06 MED ORDER — MAGIC MOUTHWASH W/LIDOCAINE: 10.0000 mL | Freq: Four times a day (QID) | ORAL | Status: DC | PRN

## 2015-10-06 MED ORDER — MAGIC MOUTHWASH W/LIDOCAINE
10.0000 mL | Freq: Four times a day (QID) | ORAL | Status: DC | PRN
  Administered 2015-10-06: 10 mL via ORAL
  Filled 2015-10-06 (×2): qty 10

## 2015-10-06 MED ORDER — BETAMETHASONE SOD PHOS & ACET 6 (3-3) MG/ML IJ SUSP
12.0000 mg | Freq: Once | INTRAMUSCULAR | Status: AC
  Administered 2015-10-06: 12 mg via INTRAMUSCULAR
  Filled 2015-10-06: qty 2

## 2015-10-06 MED ORDER — ACETAMINOPHEN 325 MG PO TABS
650.0000 mg | ORAL_TABLET | Freq: Once | ORAL | Status: AC
  Administered 2015-10-06: 650 mg via ORAL
  Filled 2015-10-06: qty 2

## 2015-10-06 NOTE — MAU Note (Signed)
Pt arrives via EMS, states her headache is getting worse and her mouth hurts and her throat hurts and the meds are not working. Tells RN repeatedly to just read her discharge instructions from the other ER, and something about trich but will not answer the question about what she went to the other ER for.

## 2015-10-06 NOTE — Discharge Instructions (Signed)
You were seen for mouth pain, specifically located on the roof of your mouth.  We saw some small areas that might be canker sores but overall did not find a reason for your pain.  You have hurricane spray you can use and tylenol is safe in pregnancy. We encourage you to pick up the medications that were sent to your pharmacy. Your urine was positive for cocaine which means you used cocaine in the last 3-5 days.  This is very dangerous to the life of your baby and can cause your placenta to come off the wall of your uterus and bleed.  You and your baby can lose a lot of blood this way.     You are a follow up appointment scheduled for Thursday at [facility]. This will be to establish/continue prenatal care.   Polysubstance Abuse When people abuse more than one drug or type of drug it is called polysubstance or polydrug abuse. For example, many smokers also drink alcohol. This is one form of polydrug abuse. Polydrug abuse also refers to the use of a drug to counteract an unpleasant effect produced by another drug. It may also be used to help with withdrawal from another drug. People who take stimulants may become agitated. Sometimes this agitation is countered with a tranquilizer. This helps protect against the unpleasant side effects. Polydrug abuse also refers to the use of different drugs at the same time.  Anytime drug use is interfering with normal living activities, it has become abuse. This includes problems with family and friends. Psychological dependence has developed when your mind tells you that the drug is needed. This is usually followed by physical dependence which has developed when continuing increases of drug are required to get the same feeling or "high". This is known as addiction or chemical dependency. A person's risk is much higher if there is a history of chemical dependency in the family. SIGNS OF CHEMICAL DEPENDENCY  You have been told by friends or family that drugs  have become a problem.  You fight when using drugs.  You are having blackouts (not remembering what you do while using).  You feel sick from using drugs but continue using.  You lie about use or amounts of drugs (chemicals) used.  You need chemicals to get you going.  You are suffering in work performance or in school because of drug use.  You get sick from use of drugs but continue to use anyway.  You need drugs to relate to people or feel comfortable in social situations.  You use drugs to forget problems. "Yes" answered to any of the above signs of chemical dependency indicates there are problems. The longer the use of drugs continues, the greater the problems will become. If there is a family history of drug or alcohol use, it is best not to experiment with these drugs. Continual use leads to tolerance. After tolerance develops more of the drug is needed to get the same feeling. This is followed by addiction. With addiction, drugs become the most important part of life. It becomes more important to take drugs than participate in the other usual activities of life. This includes relating to friends and family. Addiction is followed by dependency. Dependency is a condition where drugs are now needed not just to get high, but to feel normal. Addiction cannot be cured but it can be stopped. This often requires outside help and the care of professionals. Treatment centers are listed in the yellow pages  under: Cocaine, Narcotics, and Alcoholics Anonymous. Most hospitals and clinics can refer you to a specialized care center. Talk to your caregiver if you need help.   This information is not intended to replace advice given to you by your health care provider. Make sure you discuss any questions you have with your health care provider.   Document Released: 02/02/2005 Document Revised: 09/05/2011 Document Reviewed: 06/18/2014 Elsevier Interactive Patient Education 2016 [employer]  Canker  Sores Canker sores are small, painful sores that develop inside your mouth. They may also be called aphthous ulcers. You can get canker sores on the inside of your lips or cheeks, on your tongue, or anywhere inside your mouth. You can have just one canker sore or several of them. Canker sores cannot be passed from one person to another (noncontagious). These sores are different than the sores that you may get on the outside of your lips (cold sores or fever blisters). Canker sores usually start as painful red bumps. Then they turn into small white, yellow, or gray ulcers that have red borders. The ulcers may be quite painful. The pain may be worse when you eat or drink. CAUSES The cause of this condition is not known. RISK FACTORS This condition is more likely to develop in:  Women.  People in their teens or 2120s.  Women who are having their menstrual period.  People who are under a lot of emotional stress.  People who do not get enough iron or B vitamins.  People who have poor oral hygiene.  People who have an injury inside the mouth. This can happen after having dental work or from chewing something hard. SYMPTOMS Along with the canker sore, symptoms may also include:  Fever.  Fatigue.  Swollen lymph nodes in your neck. DIAGNOSIS This condition can be diagnosed based on your symptoms. Your health care provider will also examine your mouth. Your health care provider may also do tests if you get canker sores often or if they are very bad. Tests may include:  Blood tests to rule out other causes of canker sores.  Taking swabs from the sore to check for infection.  Taking a small piece of skin from the sore (biopsy) to test it for cancer. TREATMENT Most canker sores clear up without treatment in about 10 days. Home care is usually the only treatment that you will need. Over-the-counter medicines can relieve discomfort.If you have severe canker sores, your health care provider may  prescribe:  Numbing ointment to relieve pain.  Vitamins.  Steroid medicines. These may be given as:  Oral pills.  Mouth rinses.  Gels.  Antibiotic mouth rinse. HOME CARE INSTRUCTIONS  Apply, take, or use medicines only as directed by your health care provider. These include vitamins.  If you were prescribed an antibiotic mouth rinse, finish all of it even if you start to feel better.  Until the sores are healed:  Do not drink coffee or citrus juices.  Do not eat spicy or salty foods.  Use a mild, over-the-counter mouth rinse as directed by your health care provider.  Practice good oral hygiene.  Floss your teeth every day.  Brush your teeth with a soft brush twice each day. SEEK MEDICAL CARE IF:  Your symptoms do not get better after two weeks.  You also have a fever or swollen glands.  You get canker sores often.  You have a canker sore that is getting larger.  You cannot eat or drink due to your canker  sores.   This information is not intended to replace advice given to you by your health care provider. Make sure you discuss any questions you have with your health care provider.   Document Released: 10/08/2010 Document Revised: 10/28/2014 Document Reviewed: 05/14/2014 Elsevier Interactive Patient Education [employer]

## 2015-10-06 NOTE — MAU Provider Note (Signed)
History     CSN: [id number]  Arrival date and time: 10/06/15 0403  MAU patient contact 0500  CC: mouth Pain  HPI Patient is 30 y.o. G2P0010 [redacted]w[redacted]d here with complaints of continued oral pain and new onset headache.  She reports that she did not obtain Nystatin suspension as directed.  She has been using the [facility] spray with no relief.  She describes the headache as a 10/10 but notes that she has not taken any medication for this.  She states that she has not been able to sleep secondary to pain in her mouth since she was discharged from MAU yesterday morning.  Though states that her friend had to wake her up because she was moaning in her sleep from pain.  She reports +FM, denies LOF, VB, contractions, vaginal discharge.  She reports poor oral intake today.  OB History    Gravida Para Term Preterm AB TAB SAB Ectopic Multiple Living   2 0   1  1   0      Past Medical History  Diagnosis Date  . Asthma   . Panic disorder   . Panic attacks     Past Surgical History  Procedure Laterality Date  . No past surgeries      Family History  Problem Relation Age of Onset  . Cancer Mother   . Diabetes Father     Social History  Substance Use Topics  . Smoking status: Current Every Day Smoker -- 0.25 packs/day for 10 years    Types: Cigarettes  . Smokeless tobacco: Never Used  . Alcohol Use: Yes     Comment: occaisional     Allergies: No Known Allergies  Prescriptions prior to admission  Medication Sig Dispense Refill Last Dose  . benzocaine (HURRICAINE) 20 % oral spray Use as directed in the mouth or throat 4 (four) times daily as needed for throat irritation / pain. 57 mL 0   . nystatin (MYCOSTATIN) 100000 UNIT/ML suspension Use as directed 5 mLs (500,000 Units total) in the mouth or throat 2 (two) times daily. Swish in mouth for 1 minute and swallow 120 mL 0   . Prenatal Vit-Fe Fumarate-FA (MULTIVITAMIN-PRENATAL) 27-0.8 MG TABS tablet Take 1 tablet by mouth daily at 12  noon.   2 weeks ago    Review of Systems  Constitutional: Negative for fever and chills.  HENT: Sore throat: oral pain.   Eyes: Negative for blurred vision and photophobia.  Respiratory: Negative for shortness of breath.   Cardiovascular: Negative for chest pain.  Gastrointestinal: Positive for nausea. Negative for vomiting and abdominal pain.  Skin: Negative for rash.  Neurological: Positive for headaches. Negative for focal weakness.   Physical Exam   Blood pressure 124/65, pulse 84, temperature 98.7 F (37.1 C), temperature source Oral, resp. rate 18, last menstrual period 01/26/2015, SpO2 99 %, unknown if currently breastfeeding.  Physical Exam  Constitutional: She appears well-developed.  HENT:  Head: Normocephalic and atraumatic.  Mouth/Throat: Oropharynx is clear and moist. No oropharyngeal exudate.  Dental caries appreciated.  No oral lesions visualized.  No abscessed teeth seen.  Eyes: EOM are normal. Pupils are equal, round, and reactive to light. No scleral icterus.  Neck: Normal range of motion.  Cardiovascular: Normal rate, regular rhythm, normal heart sounds and intact distal pulses.   Respiratory: Effort normal and breath sounds normal. No respiratory distress.  GI: Soft.  gravid  Musculoskeletal: Normal range of motion. She exhibits no edema.  Neurological: She is alert.  Skin: She is not diaphoretic.   Fetal monitoringBaseline: 140 bpm, Variability: Good {> 6 bpm), Accelerations: Reactive and Decelerations: Absent Uterine activityNone and uterine irritability appreciated   MAU Course  Procedures  MDM 0430: No focal neurologic deficits.  No oral lesions on exam.  MMW, Tylenol, second dose of BMZ ordered. Oral fluids offered   Assessment and Plan   Mouth pain - Patient seemed to respond to MMW and Tylenol well, as she promptly went to sleep.  Dr Alvester MorinNewton appreciated what appear to be shallow aphthous ulcers on the roof of patient's mouth.  These did NOT  appear to be herpetic lesions.  Discussed +cocaine on UDS.  Patient seemed dismissive of these results.  Plan: Discharge patient w/ written rx for Magic Mouthwash.  Follow up in clinic as scheduled  Polysubstance abuse - Plan: Discharge patient  Supervision of high risk pregnancy, antepartum, third trimester - Plan: Second dose of BMZ administered.  Discharge patient  Cocaine use complicating pregnancy, third trimester - Plan: Discharge patient  Monica Flavinshly Gottschalk, DO 10/06/2015, 4:29 AM   OB fellow attestation: I have seen and examined this patient; I agree with above documentation in the resident's note.   Monica Holloway is a 30 y.o. G2P0010 reporting mouth pain- she was seen on 4/10 for a similar complaint. She reported no improvement in sx but did not pick up medications. She also had a HA on arrival. She came via EMS.   +FM, denies LOF, VB, contractions, vaginal discharge.  PE: BP 139/78 mmHg  Pulse 93  Temp(Src) 98.7 F (37.1 C) (Oral)  Resp 18  SpO2 99%  LMP 01/26/2015 Gen: calm comfortable, NAD. Sleeping when I entered room and I had to wake up to assess.  HEENT: bullseye type lesions on hard palate, no ulcerations, no blisters.  Heart: RR Resp: normal effort, no distress Abd: gravid Pysch: patient was sleeping soundly but then I woke to assess the patient.  She then started writhing in pain but when I discuss cocaine, patient's affect immediately changes and patient no longer grimacing or moaning. On assessment, pain seems distractable.   ROS, labs, PMH reviewed NST reactive   Plan: - Mouth pain: unlikely herpetic. Could be early aphthous ulcers. Rx for [employer] Mouthwash printed and given to patient - Cocaine use: UDS from 4/10 was discussed with patient in detail. I discussed personally with the patient that her urine indicated that she had used cocaine in the last 3-5 days. She reported her [name] "forces her to abuse drugs and that is why I am in those family services." I  provided support that no one deserved to be forced to do drugs but also asked frankly when she last used. The patient turned away from me in the bed and did not answer. I reviewed the risk of placental abruption and hypertensive crisis in the setting of cocaine use. I discussed that placental abruption can cause death to her and the baby.  She continued to ask about her mouth pain. I asked if she understood the risks of cocaine use and she responded " I know all about the risks of drugs."   - Discharge patient home with medications and reinforced her outpatient appointment on 4/13 for prenatal care.    Care was performed under the supervision of Dr. Elsie LincolnKelly Leggett.  Monica FlakeKimberly Niles Robertine Kipper, MD 6:22 AM

## 2015-10-07 LAB — CULTURE, GROUP A STREP (THRC)

## 2015-10-08 ENCOUNTER — Encounter: Payer: Medicaid Other | Admitting: Family

## 2015-10-23 ENCOUNTER — Encounter (HOSPITAL_COMMUNITY): Payer: Self-pay

## 2015-10-23 ENCOUNTER — Inpatient Hospital Stay (HOSPITAL_COMMUNITY)
Admission: AD | Admit: 2015-10-23 | Discharge: 2015-10-23 | Disposition: A | Discharge status: Home / Self Care | Source: Ambulatory Visit | Attending: Obstetrics & Gynecology | Admitting: Obstetrics & Gynecology

## 2015-10-23 DIAGNOSIS — B379 Candidiasis, unspecified: Secondary | ICD-10-CM

## 2015-10-23 DIAGNOSIS — O99333 Smoking (tobacco) complicating pregnancy, third trimester: Secondary | ICD-10-CM | POA: Insufficient documentation

## 2015-10-23 DIAGNOSIS — B373 Candidiasis of vulva and vagina: Secondary | ICD-10-CM | POA: Diagnosis not present

## 2015-10-23 DIAGNOSIS — O98813 Other maternal infectious and parasitic diseases complicating pregnancy, third trimester: Secondary | ICD-10-CM | POA: Diagnosis not present

## 2015-10-23 DIAGNOSIS — O99513 Diseases of the respiratory system complicating pregnancy, third trimester: Secondary | ICD-10-CM | POA: Insufficient documentation

## 2015-10-23 DIAGNOSIS — Z3A36 36 weeks gestation of pregnancy: Secondary | ICD-10-CM | POA: Insufficient documentation

## 2015-10-23 DIAGNOSIS — R109 Unspecified abdominal pain: Secondary | ICD-10-CM | POA: Diagnosis present

## 2015-10-23 DIAGNOSIS — J45909 Unspecified asthma, uncomplicated: Secondary | ICD-10-CM | POA: Insufficient documentation

## 2015-10-23 DIAGNOSIS — F17210 Nicotine dependence, cigarettes, uncomplicated: Secondary | ICD-10-CM | POA: Diagnosis not present

## 2015-10-23 LAB — URINE MICROSCOPIC-ADD ON: RBC / HPF: NONE SEEN RBC/hpf (ref 0–5)

## 2015-10-23 LAB — URINALYSIS, ROUTINE W REFLEX MICROSCOPIC
BILIRUBIN URINE: NEGATIVE
Glucose, UA: NEGATIVE mg/dL
Hgb urine dipstick: NEGATIVE
KETONES UR: NEGATIVE mg/dL
NITRITE: NEGATIVE
Protein, ur: NEGATIVE mg/dL
SPECIFIC GRAVITY, URINE: 1.01 (ref 1.005–1.030)
pH: 6.5 (ref 5.0–8.0)

## 2015-10-23 LAB — WET PREP, GENITAL
CLUE CELLS WET PREP: NONE SEEN
Sperm: NONE SEEN
Trich, Wet Prep: NONE SEEN

## 2015-10-23 LAB — OB RESULTS CONSOLE GC/CHLAMYDIA: Gonorrhea: NEGATIVE

## 2015-10-23 MED ORDER — FLUCONAZOLE 150 MG PO TABS
150.0000 mg | ORAL_TABLET | Freq: Once | ORAL | Status: AC
  Administered 2015-10-23: 150 mg via ORAL
  Filled 2015-10-23: qty 1

## 2015-10-23 NOTE — MAU Provider Note (Signed)
History     CSN: [id number]  Arrival date and time: 10/23/15 0147   First Provider Initiated Contact with Patient 10/23/15 0215      Chief Complaint  Patient presents with  . Abdominal Pain   HPI  This is a 30 year old G2P0010 at [redacted]w[redacted]d who presented to the MAU with vaginal itchiness and feeling like her labia is swollen. This started today. She also noticed slight pink on a rag when she was washing her genital area. She also endorses dysuria and urinary frequency. No vaginal lesions. No change in vaginal discharge. No fevers, no chills.  Endorses fetal movement. No vaginal bleeding, no leakage of fluids.  Past Medical History  Diagnosis Date  . Asthma   . Panic disorder   . Panic attacks     Past Surgical History  Procedure Laterality Date  . No past surgeries      Family History  Problem Relation Age of Onset  . Cancer Mother   . Diabetes Father     Social History  Substance Use Topics  . Smoking status: Current Every Day Smoker -- 0.25 packs/day for 10 years    Types: Cigarettes  . Smokeless tobacco: Never Used  . Alcohol Use: Yes     Comment: occaisional     Allergies: No Known Allergies  Prescriptions prior to admission  Medication Sig Dispense Refill Last Dose  . benzocaine (HURRICAINE) 20 % oral spray Use as directed in the mouth or throat 4 (four) times daily as needed for throat irritation / pain. 57 mL 0   . magic mouthwash w/lidocaine SOLN Take 10 mLs by mouth 4 (four) times daily as needed for mouth pain (swish and spit). 100 mL 0   . Prenatal Vit-Fe Fumarate-FA (MULTIVITAMIN-PRENATAL) 27-0.8 MG TABS tablet Take 1 tablet by mouth daily at 12 noon.   2 weeks ago    Review of Systems  Constitutional: Negative for fever and chills.  Eyes: Negative for blurred vision.  Respiratory: Negative for shortness of breath.   Cardiovascular: Negative for chest pain.  Gastrointestinal: Negative for heartburn, nausea, vomiting and abdominal pain.   Genitourinary: Positive for dysuria and frequency.  Musculoskeletal: Negative for myalgias.  Skin: Negative for rash.  Neurological: Negative for dizziness, weakness and headaches.  Endo/Heme/Allergies: Negative for environmental allergies.   Physical Exam   Blood pressure 118/79, pulse 88, temperature 98.1 F (36.7 C), temperature source Oral, resp. rate 18, last menstrual period 01/26/2015, unknown if currently breastfeeding.  Physical Exam  Nursing note and vitals reviewed. Constitutional: She is oriented to person, place, and time. She appears well-developed and well-nourished. No distress.  Eyes: No scleral icterus.  Neck: Normal range of motion.  Cardiovascular: Normal rate.   Respiratory: Effort normal.  GI: Soft.  Gravid, +suprapubic tenderness  Musculoskeletal: Normal range of motion.  Neurological: She is alert and oriented to person, place, and time.  Skin: Skin is warm and dry. No rash noted.    MAU Course  Procedures  MDM This is a 30 year old G2P0010 at [redacted]w[redacted]d who presented to the MAU with vaginal itchiness that started today. UA was significant for few bacteria, large leukocytes, negative nitrites. Wet prep was significant for yeast. Tocometer showed some uterine irritability, which resolved with PO fluids and rest. NST reactive.  Assessment and Plan  #Yeast Infection - Pt given Diflucan  PO x 1  - GC/Chlamydia ordered and pending - Urine culture sent - Return precautions discussed - Continue routine prenatal care  Jinny BlossomKaty D Mayo 10/23/2015, 2:38 AM   I agree with the resident's above assessment

## 2015-10-23 NOTE — MAU Note (Signed)
Patient presents with c/o abdominal pain that started tonight. Patient also states when she wiped she saw a scant amount of blood on her rag and feels vaginal irritation and burning during urination.

## 2015-10-23 NOTE — Discharge Instructions (Signed)

## 2015-10-24 LAB — CULTURE, OB URINE

## 2015-10-26 LAB — GC/CHLAMYDIA PROBE AMP (~~LOC~~) NOT AT ARMC
CHLAMYDIA, DNA PROBE: NEGATIVE
NEISSERIA GONORRHEA: NEGATIVE

## 2015-11-10 ENCOUNTER — Inpatient Hospital Stay (HOSPITAL_COMMUNITY)
Admission: AD | Admit: 2015-11-10 | Discharge: 2015-11-14 | DRG: 765 | Disposition: A | Discharge status: Home / Self Care | Payer: Medicaid Other | Source: Ambulatory Visit | Attending: Obstetrics & Gynecology | Admitting: Obstetrics & Gynecology

## 2015-11-10 ENCOUNTER — Inpatient Hospital Stay (HOSPITAL_COMMUNITY): Payer: Medicaid Other | Admitting: Anesthesiology

## 2015-11-10 ENCOUNTER — Encounter (HOSPITAL_COMMUNITY): Payer: Self-pay

## 2015-11-10 DIAGNOSIS — O4593 Premature separation of placenta, unspecified, third trimester: Principal | ICD-10-CM | POA: Diagnosis present

## 2015-11-10 DIAGNOSIS — O9952 Diseases of the respiratory system complicating childbirth: Secondary | ICD-10-CM | POA: Diagnosis present

## 2015-11-10 DIAGNOSIS — J45909 Unspecified asthma, uncomplicated: Secondary | ICD-10-CM | POA: Diagnosis present

## 2015-11-10 DIAGNOSIS — F17210 Nicotine dependence, cigarettes, uncomplicated: Secondary | ICD-10-CM | POA: Diagnosis present

## 2015-11-10 DIAGNOSIS — Z833 Family history of diabetes mellitus: Secondary | ICD-10-CM

## 2015-11-10 DIAGNOSIS — O99334 Smoking (tobacco) complicating childbirth: Secondary | ICD-10-CM | POA: Diagnosis present

## 2015-11-10 DIAGNOSIS — F410 Panic disorder [episodic paroxysmal anxiety] without agoraphobia: Secondary | ICD-10-CM | POA: Diagnosis present

## 2015-11-10 DIAGNOSIS — F1910 Other psychoactive substance abuse, uncomplicated: Secondary | ICD-10-CM

## 2015-11-10 DIAGNOSIS — O458X3 Other premature separation of placenta, third trimester: Secondary | ICD-10-CM | POA: Diagnosis not present

## 2015-11-10 DIAGNOSIS — Z98891 History of uterine scar from previous surgery: Secondary | ICD-10-CM

## 2015-11-10 DIAGNOSIS — O4693 Antepartum hemorrhage, unspecified, third trimester: Secondary | ICD-10-CM | POA: Diagnosis present

## 2015-11-10 DIAGNOSIS — O99324 Drug use complicating childbirth: Secondary | ICD-10-CM | POA: Diagnosis present

## 2015-11-10 DIAGNOSIS — O99344 Other mental disorders complicating childbirth: Secondary | ICD-10-CM | POA: Diagnosis present

## 2015-11-10 DIAGNOSIS — O4690 Antepartum hemorrhage, unspecified, unspecified trimester: Secondary | ICD-10-CM | POA: Diagnosis present

## 2015-11-10 DIAGNOSIS — Z3A39 39 weeks gestation of pregnancy: Secondary | ICD-10-CM

## 2015-11-10 DIAGNOSIS — F1410 Cocaine abuse, uncomplicated: Secondary | ICD-10-CM | POA: Diagnosis not present

## 2015-11-10 DIAGNOSIS — J811 Chronic pulmonary edema: Secondary | ICD-10-CM

## 2015-11-10 LAB — CBC
HCT: 38.4 % (ref 36.0–46.0)
HEMOGLOBIN: 13.2 g/dL (ref 12.0–15.0)
MCH: 32.8 pg (ref 26.0–34.0)
MCHC: 34.4 g/dL (ref 30.0–36.0)
MCV: 95.5 fL (ref 78.0–100.0)
Platelets: 358 10*3/uL (ref 150–400)
RBC: 4.02 MIL/uL (ref 3.87–5.11)
RDW: 13.6 % (ref 11.5–15.5)
WBC: 5.9 10*3/uL (ref 4.0–10.5)

## 2015-11-10 LAB — RAPID URINE DRUG SCREEN, HOSP PERFORMED
AMPHETAMINES: NOT DETECTED
BENZODIAZEPINES: NOT DETECTED
Barbiturates: NOT DETECTED
Cocaine: POSITIVE — AB
OPIATES: NOT DETECTED
TETRAHYDROCANNABINOL: POSITIVE — AB

## 2015-11-10 LAB — OB RESULTS CONSOLE GBS: GBS: NEGATIVE

## 2015-11-10 MED ORDER — SODIUM CHLORIDE 0.9 % IV BOLUS (SEPSIS)
1000.0000 mL | Freq: Once | INTRAVENOUS | Status: AC
  Administered 2015-11-10: 1000 mL via INTRAVENOUS

## 2015-11-10 MED ORDER — CITRIC ACID-SODIUM CITRATE 334-500 MG/5ML PO SOLN
30.0000 mL | ORAL | Status: DC | PRN
  Administered 2015-11-11: 30 mL via ORAL
  Filled 2015-11-10: qty 15

## 2015-11-10 MED ORDER — LACTATED RINGERS IV SOLN
INTRAVENOUS | Status: DC
  Administered 2015-11-10 – 2015-11-11 (×5): via INTRAVENOUS

## 2015-11-10 MED ORDER — FENTANYL 2.5 MCG/ML BUPIVACAINE 1/10 % EPIDURAL INFUSION (WH - ANES)
14.0000 mL/h | INTRAMUSCULAR | Status: DC | PRN
  Administered 2015-11-10 – 2015-11-11 (×3): 14 mL/h via EPIDURAL
  Filled 2015-11-10 (×2): qty 125

## 2015-11-10 MED ORDER — OXYTOCIN BOLUS FROM INFUSION: 500.0000 mL | INTRAVENOUS | Status: DC

## 2015-11-10 MED ORDER — DIPHENHYDRAMINE HCL 50 MG/ML IJ SOLN: 12.5000 mg | INTRAMUSCULAR | Status: DC | PRN

## 2015-11-10 MED ORDER — FENTANYL CITRATE (PF) 100 MCG/2ML IJ SOLN
100.0000 ug | Freq: Once | INTRAMUSCULAR | Status: AC
  Administered 2015-11-10: 100 ug via INTRAVENOUS
  Filled 2015-11-10: qty 2

## 2015-11-10 MED ORDER — OXYCODONE-ACETAMINOPHEN 5-325 MG PO TABS: 2.0000 | ORAL_TABLET | ORAL | Status: DC | PRN

## 2015-11-10 MED ORDER — LACTATED RINGERS IV SOLN
500.0000 mL | INTRAVENOUS | Status: DC | PRN
  Administered 2015-11-11: 500 mL via INTRAVENOUS

## 2015-11-10 MED ORDER — EPHEDRINE 5 MG/ML INJ
10.0000 mg | INTRAVENOUS | Status: DC | PRN
  Filled 2015-11-10: qty 4

## 2015-11-10 MED ORDER — OXYCODONE-ACETAMINOPHEN 5-325 MG PO TABS: 1.0000 | ORAL_TABLET | ORAL | Status: DC | PRN

## 2015-11-10 MED ORDER — OXYTOCIN 40 UNITS IN LACTATED RINGERS INFUSION - SIMPLE MED: 2.5000 [IU]/h | INTRAVENOUS | Status: DC

## 2015-11-10 MED ORDER — PHENYLEPHRINE 40 MCG/ML (10ML) SYRINGE FOR IV PUSH (FOR BLOOD PRESSURE SUPPORT)
80.0000 ug | PREFILLED_SYRINGE | INTRAVENOUS | Status: DC | PRN
  Administered 2015-11-11: 80 ug via INTRAVENOUS
  Filled 2015-11-10 (×3): qty 10

## 2015-11-10 MED ORDER — EPHEDRINE 5 MG/ML INJ
10.0000 mg | INTRAVENOUS | Status: AC | PRN
  Administered 2015-11-11 (×2): 10 mg via INTRAVENOUS

## 2015-11-10 MED ORDER — ACETAMINOPHEN 325 MG PO TABS: 650.0000 mg | ORAL_TABLET | ORAL | Status: DC | PRN

## 2015-11-10 MED ORDER — LIDOCAINE HCL (PF) 1 % IJ SOLN
INTRAMUSCULAR | Status: DC | PRN
  Administered 2015-11-10 (×2): 4 mL via EPIDURAL
  Administered 2015-11-10: 2 mL via EPIDURAL

## 2015-11-10 MED ORDER — LIDOCAINE HCL (PF) 1 % IJ SOLN: 30.0000 mL | INTRAMUSCULAR | Status: DC | PRN

## 2015-11-10 MED ORDER — ONDANSETRON HCL 4 MG/2ML IJ SOLN
4.0000 mg | Freq: Four times a day (QID) | INTRAMUSCULAR | Status: DC | PRN
  Administered 2015-11-11: 4 mg via INTRAVENOUS
  Filled 2015-11-10 (×2): qty 2

## 2015-11-10 MED ORDER — LACTATED RINGERS IV SOLN
500.0000 mL | Freq: Once | INTRAVENOUS | Status: AC
  Administered 2015-11-11: 1000 mL via INTRAVENOUS

## 2015-11-10 MED ORDER — PHENYLEPHRINE 40 MCG/ML (10ML) SYRINGE FOR IV PUSH (FOR BLOOD PRESSURE SUPPORT)
80.0000 ug | PREFILLED_SYRINGE | INTRAVENOUS | Status: AC | PRN
  Administered 2015-11-11: 40 ug via INTRAVENOUS
  Administered 2015-11-11 (×2): 80 ug via INTRAVENOUS

## 2015-11-10 NOTE — Anesthesia Pain Management Evaluation Note (Signed)
  CRNA Pain Management Visit Note  Patient: Monica RumpsBrittany L Taillon, 30 y.o., female  "Hello I am a [facility] of the anesthesia [facility]. We have an anesthesia team available at all times to provide care throughout the hospital, including epidural management and anesthesia for C-section. I don't know your plan for the delivery whether it a natural birth, water birth, IV sedation, nitrous supplementation, doula or epidural, but we want to meet your pain goals."   1.Was your pain managed to your expectations on prior hospitalizations?   No prior hospitalizations  2.What is your expectation for pain management during this hospitalization?     Epidural  3.How can we help you reach that goal? Epidural  Record the patient's initial score and the patient's pain goal.   Pain: 8  Pain Goal: 8 [facility] wants you to be able to say your pain was always managed very well.  Cephus ShellingBURGER,Karinna Beadles 11/10/2015

## 2015-11-10 NOTE — MAU Provider Note (Signed)
S:  [name] is a 30 y.o. female 402P0010 @ 7075w2d with a history of cocaine use (most recent use today) here with vaginal bleeding and contraction pain.  I was called to the room by the RN to evaluate the patient's bleeding prior to her transfer to labor and delivery for active labor.  O:  GENERAL: Well-developed, well-nourished female in no acute distress.  LUNGS: Effort normal SKIN: Warm, dry and without erythema Speculum exam: Vagina - Moderate amount of particulate, bright red blood pooling in the vagina.  imanual exam: Cervix:  3, 80%, -1 Exam by Venia CarbonJennifer Passion Lavin NP  Chaperone present for exam.  MDM: B positive blood type  Dr. Alvester MorinNewton notified of spec exam Patient to L&D    A:  Vaginal bleeding in third trimester  Active labor   P:  Admit  Monica LopeJennifer I Avika Carbine, NP 11/10/2015 8:39 PM

## 2015-11-10 NOTE — MAU Note (Signed)
Pt presents via EMS with contractions every 2-3 minutes. Denies leaking of fluid. Some bloody show on arrival.

## 2015-11-10 NOTE — Anesthesia Procedure Notes (Signed)
Epidural Patient location during procedure: OB  Staffing Anesthesiologist: Gerhardt Gleed Performed by: anesthesiologist   Preanesthetic Checklist Completed: patient identified, site marked, surgical consent, pre-op evaluation, timeout performed, IV checked, risks and benefits discussed and monitors and equipment checked  Epidural Patient position: sitting Prep: site prepped and draped and DuraPrep Patient monitoring: continuous pulse ox and blood pressure Approach: midline Location: L3-L4 Injection technique: LOR saline  Needle:  Needle type: Tuohy  Needle gauge: 17 G Needle length: 9 cm and 9 Needle insertion depth: 5.5 cm Catheter type: closed end flexible Catheter size: 19 Gauge Catheter at skin depth: 9.5 cm Test dose: negative  Assessment Sensory level: T8 Events: blood not aspirated, injection not painful, no injection resistance, negative IV test and no paresthesia  Additional Notes Patient identified. Risks/Benefits/Options discussed with patient including but not limited to bleeding, infection, nerve damage, paralysis, failed block, incomplete pain control, headache, blood pressure changes, nausea, vomiting, reactions to medications, itching and postpartum back pain. Confirmed with bedside nurse the patient's most recent platelet count. Confirmed with patient that they are not currently taking any anticoagulation, have any bleeding history or any family history of bleeding disorders. Patient expressed understanding and wished to proceed. All questions were answered. Sterile technique was used throughout the entire procedure. Please see nursing notes for vital signs. Test dose was given through epidural catheter and negative prior to continuing to dose epidural or start infusion. Warning signs of high block given to the patient including shortness of breath, tingling/numbness in hands, complete motor block, or any concerning symptoms with instructions to call for help.  Patient was given instructions on fall risk and not to get out of bed. All questions and concerns addressed with instructions to call with any issues or inadequate analgesia.  Reason for block:procedure for pain   

## 2015-11-10 NOTE — Anesthesia Preprocedure Evaluation (Signed)
Anesthesia Evaluation  Patient identified by MRN, date of birth, ID band Patient awake    Reviewed: Allergy & Precautions, H&P , NPO status , Patient's Chart, lab work & pertinent test results  History of Anesthesia Complications Negative for: history of anesthetic complications  Airway Mallampati: I  TM Distance: >3 FB Neck ROM: full    Dental no notable dental hx.    Pulmonary asthma , Current Smoker,    Pulmonary exam normal breath sounds clear to auscultation       Cardiovascular negative cardio ROS Normal cardiovascular exam Rhythm:regular Rate:Normal     Neuro/Psych Anxiety negative neurological ROS     GI/Hepatic negative GI ROS, (+)     substance abuse  cocaine use and marijuana use,   Endo/Other  negative endocrine ROS  Renal/GU negative Renal ROS     Musculoskeletal   Abdominal   Peds  Hematology negative hematology ROS (+)   Anesthesia Other Findings Pregnancy - complicated by substance abuse ( cocaine and marijuana + on day of labor), significant anxiety Platelets and allergies reviewed Denies active cardiac or pulmonary symptoms, METS > 4  Denies blood thinning medications, bleeding disorders, supine hypotension syndrome, previous anesthesia difficulties    Reproductive/Obstetrics negative OB ROS                             Anesthesia Physical Anesthesia Plan  ASA: III  Anesthesia Plan: Epidural   Post-op Pain Management:    Induction:   Airway Management Planned:   Additional Equipment:   Intra-op Plan:   Post-operative Plan:   Informed Consent: I have reviewed the patients History and Physical, chart, labs and discussed the procedure including the risks, benefits and alternatives for the proposed anesthesia with the patient or authorized representative who has indicated his/her understanding and acceptance.   Dental Advisory Given  Plan Discussed  with: Anesthesiologist, CRNA and Surgeon  Anesthesia Plan Comments:         Anesthesia Quick Evaluation

## 2015-11-10 NOTE — H&P (Signed)
OBSTETRIC ADMISSION HISTORY AND PHYSICAL Monica RumpsBrittany L Holloway is a 30 y.o. female G2P0010 with IUP at 6059w2d by 12wk presenting for vaginal bleeding. She reports +FMs, No LOF, no VB, no blurry vision, headaches or peripheral edema, and RUQ pain.  She plans on bottle feeding. She request depo for birth control.  Dating: By Sung Amabile12wk --->  Estimated Date of Delivery: 11/15/15  Clinic  [facility] Prenatal Labs  Dating 1st trimester ultrasound Blood type:   B+  Genetic Screen 1 Screen:    AFP:     Quad:     NIPS: Antibody: Neg  Anatomic US  27 wks nnl Rubella:  immune  GTT Early:               Third trimester:  93  RPR:   neg  Flu vaccine Not received this pregnancy HBsAg:   Neg  TDaP vaccine Not received this pregnancy                             Rhogam: HIV:   NR  Baby Food  Bottle (possible cocaine exposure)                      GBS: (For PCN allergy, check sensitivities)  Contraception  Depo Pap:  Negative  Circumcision  Desires   Pediatrician    Support Person  Onalee HuaDavid (father)     Prenatal History/Complications:  Past Medical History: Past Medical History  Diagnosis Date  . Asthma   . Panic disorder   . Panic attacks     Past Surgical History: Past Surgical History  Procedure Laterality Date  . No past surgeries      Obstetrical History: OB History    Gravida Para Term Preterm AB TAB SAB Ectopic Multiple Living   2 0   1  1   0      Social History: Social History   Social History  . Marital Status: Single    Spouse Name: N/A  . Number of Children: N/A  . Years of Education: N/A   Social History Main Topics  . Smoking status: Current Every Day Smoker -- 0.25 packs/day for 10 years    Types: Cigarettes  . Smokeless tobacco: Never Used  . Alcohol Use: Yes     Comment: occaisional   . Drug Use: 15.00 per week    Special: Marijuana, Cocaine     Comment: last use January 2017  . Sexual Activity: Yes    Birth Control/ Protection: None   Other Topics Concern  . None   Social  History Narrative    Family History: Family History  Problem Relation Age of Onset  . Cancer Mother   . Diabetes Father     Allergies: No Known Allergies  Prescriptions prior to admission  Medication Sig Dispense Refill Last Dose  . benzocaine (HURRICAINE) 20 % oral spray Use as directed in the mouth or throat 4 (four) times daily as needed for throat irritation / pain. 57 mL 0   . magic mouthwash w/lidocaine SOLN Take 10 mLs by mouth 4 (four) times daily as needed for mouth pain (swish and spit). 100 mL 0   . Prenatal Vit-Fe Fumarate-FA (MULTIVITAMIN-PRENATAL) 27-0.8 MG TABS tablet Take 1 tablet by mouth daily at 12 noon.   2 weeks ago     Review of Systems   All systems reviewed and negative except as stated in HPI  Blood pressure  129/75, pulse 111, temperature 98.5 F (36.9 C), temperature source Oral, resp. rate 20, last menstrual period 01/26/2015, unknown if currently breastfeeding. General appearance: alert, cooperative and appears stated age Lungs: clear to auscultation bilaterally Heart: regular rate and rhythm Abdomen: soft, non-tender; bowel sounds normal Pelvic: adequate-- Speculum exam performed by Venia Carbon and she reported pooling vaginal blood Extremities: Homans sign is negative, no sign of DVT DTR's wnl Presentation: cephalic Fetal monitoringBaseline: 120 bpm, Variability: Good {> 6 bpm), Accelerations: Reactive and Decelerations: Absent. Period of minimal variability that improved throughout tracing.   Uterine activityFrequency: Every 3 minutes Dilation: 2.5 Effacement (%): 90 Station: -1 Exam by:: L. Clemmons CNM   Prenatal labs: ABO, Rh: B/POS/-- (02/21 1308) Antibody: NEG (02/21 0922) Rubella: !Error! RPR: NON REAC (02/21 6578)  HBsAg: NEGATIVE (02/21 4696)  HIV: NONREACTIVE (02/21 0922)  GBS:    1 hr Glucola 93 Genetic screening  Early US Anatomy US WNL  Prenatal Transfer Tool  Maternal Diabetes: No Genetic Screening:  Normal Maternal Ultrasounds/Referrals: Normal Fetal Ultrasounds or other Referrals:  None Maternal Substance Abuse:  Yes:  Type: Cocaine Significant Maternal Medications:  None Significant Maternal Lab Results: Lab values include: Group B Strep negative  No results found for this or any previous visit (from the past 24 hour(s)).  Patient Active Problem List   Diagnosis Date Noted  . Polysubstance abuse 10/05/2015  . Trichomonal vaginitis during pregnancy, antepartum 10/05/2015  . BV (bacterial vaginosis) 08/19/2015  . Supervision of high risk pregnancy, antepartum 08/18/2015  . Cocaine use complicating pregnancy 08/18/2015    Assessment: KAHMARI HERARD is a 30 y.o. G2P0010 at [redacted]w[redacted]d here for vaginal bleeding at term with recently cocaine use, suspicious for abruption.   #Labor: Expectant managment #Pain: Prn med and epidural #FWB: Cat II #ID:  GBS neg #MOF: bottle #MOC: Depo #Circ:  Desires, planning outpatient #Cocaine use:  UDS positive multiple times in pregnancy and endorses recent use. SW consult needed postpartum  Federico Flake, MD  11/10/2015, 8:41 PM

## 2015-11-11 ENCOUNTER — Inpatient Hospital Stay (HOSPITAL_COMMUNITY): Payer: Medicaid Other

## 2015-11-11 ENCOUNTER — Encounter (HOSPITAL_COMMUNITY)
Admission: AD | Disposition: A | Discharge status: Home / Self Care | Payer: Self-pay | Source: Ambulatory Visit | Attending: Obstetrics & Gynecology

## 2015-11-11 ENCOUNTER — Encounter (HOSPITAL_COMMUNITY): Payer: Self-pay | Admitting: *Deleted

## 2015-11-11 ENCOUNTER — Encounter (HOSPITAL_COMMUNITY): Payer: Self-pay | Admitting: Certified Registered Nurse Anesthetist

## 2015-11-11 DIAGNOSIS — O99324 Drug use complicating childbirth: Secondary | ICD-10-CM

## 2015-11-11 DIAGNOSIS — F1910 Other psychoactive substance abuse, uncomplicated: Secondary | ICD-10-CM

## 2015-11-11 DIAGNOSIS — F1410 Cocaine abuse, uncomplicated: Secondary | ICD-10-CM

## 2015-11-11 DIAGNOSIS — Z3A39 39 weeks gestation of pregnancy: Secondary | ICD-10-CM

## 2015-11-11 DIAGNOSIS — O458X3 Other premature separation of placenta, third trimester: Secondary | ICD-10-CM

## 2015-11-11 LAB — CBC
HCT: 33.4 % — ABNORMAL LOW (ref 36.0–46.0)
HEMATOCRIT: 33.3 % — AB (ref 36.0–46.0)
Hemoglobin: 11.5 g/dL — ABNORMAL LOW (ref 12.0–15.0)
Hemoglobin: 11.5 g/dL — ABNORMAL LOW (ref 12.0–15.0)
MCH: 32.8 pg (ref 26.0–34.0)
MCH: 32.8 pg (ref 26.0–34.0)
MCHC: 34.4 g/dL (ref 30.0–36.0)
MCHC: 34.5 g/dL (ref 30.0–36.0)
MCV: 95.2 fL (ref 78.0–100.0)
MCV: 94.9 fL (ref 78.0–100.0)
PLATELETS: 224 10*3/uL (ref 150–400)
PLATELETS: 297 10*3/uL (ref 150–400)
RBC: 3.51 MIL/uL — ABNORMAL LOW (ref 3.87–5.11)
RBC: 3.51 MIL/uL — ABNORMAL LOW (ref 3.87–5.11)
RDW: 13.7 % (ref 11.5–15.5)
RDW: 13.6 % (ref 11.5–15.5)
WBC: 7.8 10*3/uL (ref 4.0–10.5)
WBC: 12.5 10*3/uL — AB (ref 4.0–10.5)

## 2015-11-11 LAB — DIC (DISSEMINATED INTRAVASCULAR COAGULATION) PANEL
APTT: 26 s (ref 24–37)
D DIMER QUANT: 1.82 ug{FEU}/mL — AB (ref 0.00–0.50)
FIBRINOGEN: 461 mg/dL (ref 204–475)
INR: 0.96 (ref 0.00–1.49)
PLATELETS: 244 10*3/uL (ref 150–400)
SMEAR REVIEW: NONE SEEN

## 2015-11-11 LAB — PREPARE RBC (CROSSMATCH)

## 2015-11-11 LAB — DIC (DISSEMINATED INTRAVASCULAR COAGULATION)PANEL: Prothrombin Time: 13 seconds (ref 11.6–15.2)

## 2015-11-11 SURGERY — Surgical Case
Anesthesia: Regional

## 2015-11-11 MED ORDER — DIPHENHYDRAMINE HCL 25 MG PO CAPS
25.0000 mg | ORAL_CAPSULE | ORAL | Status: DC | PRN
  Administered 2015-11-12: 25 mg via ORAL
  Filled 2015-11-11 (×2): qty 1

## 2015-11-11 MED ORDER — ONDANSETRON HCL 4 MG/2ML IJ SOLN
INTRAMUSCULAR | Status: DC | PRN
  Administered 2015-11-11: 4 mg via INTRAVENOUS

## 2015-11-11 MED ORDER — MENTHOL 3 MG MT LOZG: 1.0000 | LOZENGE | OROMUCOSAL | Status: DC | PRN

## 2015-11-11 MED ORDER — NALBUPHINE HCL 10 MG/ML IJ SOLN: 5.0000 mg | Freq: Once | INTRAMUSCULAR | Status: DC | PRN

## 2015-11-11 MED ORDER — CEFAZOLIN SODIUM-DEXTROSE 2-4 GM/100ML-% IV SOLN: 2.0000 g | INTRAVENOUS | Status: DC

## 2015-11-11 MED ORDER — IBUPROFEN 600 MG PO TABS
600.0000 mg | ORAL_TABLET | Freq: Four times a day (QID) | ORAL | Status: DC
  Administered 2015-11-11 – 2015-11-14 (×12): 600 mg via ORAL
  Filled 2015-11-11 (×12): qty 1

## 2015-11-11 MED ORDER — SCOPOLAMINE 1 MG/3DAYS TD PT72
MEDICATED_PATCH | TRANSDERMAL | Status: DC | PRN
  Administered 2015-11-11: 1 via TRANSDERMAL

## 2015-11-11 MED ORDER — MEPERIDINE HCL 25 MG/ML IJ SOLN
INTRAMUSCULAR | Status: DC | PRN
  Administered 2015-11-11: 12.5 mg via INTRAVENOUS

## 2015-11-11 MED ORDER — SODIUM CHLORIDE 0.9% FLUSH: 3.0000 mL | INTRAVENOUS | Status: DC | PRN

## 2015-11-11 MED ORDER — ONDANSETRON HCL 4 MG/2ML IJ SOLN: 4.0000 mg | Freq: Once | INTRAMUSCULAR | Status: DC | PRN

## 2015-11-11 MED ORDER — SIMETHICONE 80 MG PO CHEW
80.0000 mg | CHEWABLE_TABLET | Freq: Three times a day (TID) | ORAL | Status: DC
  Administered 2015-11-11 – 2015-11-13 (×4): 80 mg via ORAL
  Filled 2015-11-11 (×6): qty 1

## 2015-11-11 MED ORDER — SIMETHICONE 80 MG PO CHEW: 80.0000 mg | CHEWABLE_TABLET | ORAL | Status: DC | PRN

## 2015-11-11 MED ORDER — NALBUPHINE HCL 10 MG/ML IJ SOLN: 5.0000 mg | INTRAMUSCULAR | Status: DC | PRN

## 2015-11-11 MED ORDER — KETOROLAC TROMETHAMINE 30 MG/ML IJ SOLN: 30.0000 mg | Freq: Four times a day (QID) | INTRAMUSCULAR | Status: DC | PRN

## 2015-11-11 MED ORDER — PHENYLEPHRINE 8 MG IN D5W 100 ML (0.08MG/ML) PREMIX OPTIME
INJECTION | INTRAVENOUS | Status: DC | PRN
  Administered 2015-11-11: 60 ug/min via INTRAVENOUS

## 2015-11-11 MED ORDER — WITCH HAZEL-GLYCERIN EX PADS: 1.0000 "application " | MEDICATED_PAD | CUTANEOUS | Status: DC | PRN

## 2015-11-11 MED ORDER — NALOXONE HCL 0.4 MG/ML IJ SOLN: 0.4000 mg | INTRAMUSCULAR | Status: DC | PRN

## 2015-11-11 MED ORDER — SODIUM BICARBONATE 8.4 % IV SOLN
INTRAVENOUS | Status: AC
  Filled 2015-11-11: qty 50

## 2015-11-11 MED ORDER — PRENATAL MULTIVITAMIN CH
1.0000 | ORAL_TABLET | Freq: Every day | ORAL | Status: DC
  Administered 2015-11-12 – 2015-11-14 (×3): 1 via ORAL
  Filled 2015-11-11 (×3): qty 1

## 2015-11-11 MED ORDER — OXYTOCIN 40 UNITS IN LACTATED RINGERS INFUSION - SIMPLE MED
INTRAVENOUS | Status: AC
  Administered 2015-11-11: 2 [IU] via INTRAVENOUS
  Filled 2015-11-11: qty 1000

## 2015-11-11 MED ORDER — SIMETHICONE 80 MG PO CHEW
80.0000 mg | CHEWABLE_TABLET | ORAL | Status: DC
  Administered 2015-11-11 – 2015-11-12 (×2): 80 mg via ORAL
  Filled 2015-11-11 (×2): qty 1

## 2015-11-11 MED ORDER — LIDOCAINE-EPINEPHRINE (PF) 2 %-1:200000 IJ SOLN
INTRAMUSCULAR | Status: AC
  Filled 2015-11-11: qty 20

## 2015-11-11 MED ORDER — PHENYLEPHRINE 8 MG IN D5W 100 ML (0.08MG/ML) PREMIX OPTIME
INJECTION | INTRAVENOUS | Status: AC
  Filled 2015-11-11: qty 100

## 2015-11-11 MED ORDER — LIDOCAINE-EPINEPHRINE (PF) 2 %-1:200000 IJ SOLN
INTRAMUSCULAR | Status: DC | PRN
  Administered 2015-11-11 (×2): 5 mL via EPIDURAL

## 2015-11-11 MED ORDER — OXYCODONE HCL 5 MG PO TABS
5.0000 mg | ORAL_TABLET | ORAL | Status: DC | PRN
  Administered 2015-11-11 – 2015-11-14 (×4): 5 mg via ORAL
  Filled 2015-11-11 (×4): qty 1

## 2015-11-11 MED ORDER — ACETAMINOPHEN 500 MG PO TABS
1000.0000 mg | ORAL_TABLET | Freq: Four times a day (QID) | ORAL | Status: AC
  Administered 2015-11-11 – 2015-11-12 (×3): 1000 mg via ORAL
  Filled 2015-11-11 (×3): qty 2

## 2015-11-11 MED ORDER — DEXAMETHASONE SODIUM PHOSPHATE 4 MG/ML IJ SOLN
INTRAMUSCULAR | Status: AC
  Filled 2015-11-11: qty 1

## 2015-11-11 MED ORDER — MORPHINE SULFATE (PF) 0.5 MG/ML IJ SOLN
INTRAMUSCULAR | Status: DC | PRN
  Administered 2015-11-11: 3.5 mg via EPIDURAL

## 2015-11-11 MED ORDER — FENTANYL CITRATE (PF) 100 MCG/2ML IJ SOLN: 25.0000 ug | INTRAMUSCULAR | Status: DC | PRN

## 2015-11-11 MED ORDER — SCOPOLAMINE 1 MG/3DAYS TD PT72: 1.0000 | MEDICATED_PATCH | Freq: Once | TRANSDERMAL | Status: DC

## 2015-11-11 MED ORDER — MEPERIDINE HCL 25 MG/ML IJ SOLN
INTRAMUSCULAR | Status: AC
  Filled 2015-11-11: qty 1

## 2015-11-11 MED ORDER — EPHEDRINE 5 MG/ML INJ
INTRAVENOUS | Status: AC
  Filled 2015-11-11: qty 10

## 2015-11-11 MED ORDER — ACETAMINOPHEN 325 MG PO TABS
650.0000 mg | ORAL_TABLET | ORAL | Status: DC | PRN
  Administered 2015-11-13 – 2015-11-14 (×3): 650 mg via ORAL
  Filled 2015-11-11 (×3): qty 2

## 2015-11-11 MED ORDER — DEXAMETHASONE SODIUM PHOSPHATE 4 MG/ML IJ SOLN
INTRAMUSCULAR | Status: DC | PRN
  Administered 2015-11-11: 4 mg via INTRAVENOUS

## 2015-11-11 MED ORDER — MORPHINE SULFATE (PF) 0.5 MG/ML IJ SOLN
INTRAMUSCULAR | Status: AC
  Filled 2015-11-11: qty 10

## 2015-11-11 MED ORDER — PNEUMOCOCCAL VAC POLYVALENT 25 MCG/0.5ML IJ INJ
0.5000 mL | INJECTION | INTRAMUSCULAR | Status: AC
  Administered 2015-11-12: 0.5 mL via INTRAMUSCULAR
  Filled 2015-11-11 (×2): qty 0.5

## 2015-11-11 MED ORDER — DIPHENHYDRAMINE HCL 25 MG PO CAPS: 25.0000 mg | ORAL_CAPSULE | Freq: Four times a day (QID) | ORAL | Status: DC | PRN

## 2015-11-11 MED ORDER — ZOLPIDEM TARTRATE 5 MG PO TABS: 5.0000 mg | ORAL_TABLET | Freq: Every evening | ORAL | Status: DC | PRN

## 2015-11-11 MED ORDER — DIPHENHYDRAMINE HCL 50 MG/ML IJ SOLN: 12.5000 mg | INTRAMUSCULAR | Status: DC | PRN

## 2015-11-11 MED ORDER — SENNOSIDES-DOCUSATE SODIUM 8.6-50 MG PO TABS
2.0000 | ORAL_TABLET | ORAL | Status: DC
  Administered 2015-11-11 – 2015-11-13 (×3): 2 via ORAL
  Filled 2015-11-11 (×3): qty 2

## 2015-11-11 MED ORDER — OXYTOCIN 40 UNITS IN LACTATED RINGERS INFUSION - SIMPLE MED: 2.5000 [IU]/h | INTRAVENOUS | Status: AC

## 2015-11-11 MED ORDER — ONDANSETRON HCL 4 MG/2ML IJ SOLN
INTRAMUSCULAR | Status: AC
  Filled 2015-11-11: qty 2

## 2015-11-11 MED ORDER — OXYTOCIN 40 UNITS IN LACTATED RINGERS INFUSION - SIMPLE MED
1.0000 m[IU]/min | INTRAVENOUS | Status: DC
  Administered 2015-11-11: 2 [IU] via INTRAVENOUS

## 2015-11-11 MED ORDER — COCONUT OIL OIL
1.0000 "application " | TOPICAL_OIL | Status: DC | PRN
  Filled 2015-11-11: qty 120

## 2015-11-11 MED ORDER — OXYTOCIN 10 UNIT/ML IJ SOLN
INTRAMUSCULAR | Status: AC
  Filled 2015-11-11: qty 4

## 2015-11-11 MED ORDER — TERBUTALINE SULFATE 1 MG/ML IJ SOLN
0.2500 mg | Freq: Once | INTRAMUSCULAR | Status: AC | PRN
Start: 2015-11-11 — End: 2015-11-11
  Administered 2015-11-11: 0.25 mg via SUBCUTANEOUS
  Filled 2015-11-11: qty 1

## 2015-11-11 MED ORDER — NALOXONE HCL 2 MG/2ML IJ SOSY
1.0000 ug/kg/h | PREFILLED_SYRINGE | INTRAMUSCULAR | Status: DC | PRN
  Filled 2015-11-11: qty 2

## 2015-11-11 MED ORDER — CEFAZOLIN SODIUM-DEXTROSE 2-3 GM-% IV SOLR
INTRAVENOUS | Status: DC | PRN
  Administered 2015-11-11: 2 g via INTRAVENOUS

## 2015-11-11 MED ORDER — LACTATED RINGERS IV SOLN
40.0000 [IU] | INTRAVENOUS | Status: DC | PRN
  Administered 2015-11-11: 40 [IU] via INTRAVENOUS

## 2015-11-11 MED ORDER — MEPERIDINE HCL 25 MG/ML IJ SOLN: 6.2500 mg | INTRAMUSCULAR | Status: DC | PRN

## 2015-11-11 MED ORDER — LACTATED RINGERS IV SOLN
INTRAVENOUS | Status: DC
  Administered 2015-11-11 – 2015-11-12 (×2): via INTRAVENOUS

## 2015-11-11 MED ORDER — DIBUCAINE 1 % RE OINT: 1.0000 "application " | TOPICAL_OINTMENT | RECTAL | Status: DC | PRN

## 2015-11-11 MED ORDER — SCOPOLAMINE 1 MG/3DAYS TD PT72
MEDICATED_PATCH | TRANSDERMAL | Status: AC
  Filled 2015-11-11: qty 1

## 2015-11-11 MED ORDER — ONDANSETRON HCL 4 MG/2ML IJ SOLN: 4.0000 mg | Freq: Three times a day (TID) | INTRAMUSCULAR | Status: DC | PRN

## 2015-11-11 MED ORDER — TETANUS-DIPHTH-ACELL PERTUSSIS 5-2.5-18.5 LF-MCG/0.5 IM SUSP
0.5000 mL | Freq: Once | INTRAMUSCULAR | Status: AC
  Administered 2015-11-12: 0.5 mL via INTRAMUSCULAR
  Filled 2015-11-11 (×2): qty 0.5

## 2015-11-11 MED ORDER — OXYCODONE HCL 5 MG PO TABS
10.0000 mg | ORAL_TABLET | ORAL | Status: DC | PRN
  Administered 2015-11-12 – 2015-11-14 (×4): 10 mg via ORAL
  Filled 2015-11-11 (×5): qty 2

## 2015-11-11 MED ORDER — CEFAZOLIN SODIUM-DEXTROSE 2-4 GM/100ML-% IV SOLN
INTRAVENOUS | Status: AC
  Filled 2015-11-11: qty 100

## 2015-11-11 SURGICAL SUPPLY — 32 items
BENZOIN TINCTURE PRP APPL 2/3 (GAUZE/BANDAGES/DRESSINGS) ×3 IMPLANT
CLAMP CORD UMBIL (MISCELLANEOUS) IMPLANT
CLOSURE WOUND 1/2 X4 (GAUZE/BANDAGES/DRESSINGS) ×1
CONTAINER PREFILL 10% NBF 15ML (MISCELLANEOUS) IMPLANT
DRSG OPSITE POSTOP 4X10 (GAUZE/BANDAGES/DRESSINGS) ×3 IMPLANT
DURAPREP 26ML APPLICATOR (WOUND CARE) ×3 IMPLANT
ELECT REM PT RETURN 9FT ADLT (ELECTROSURGICAL) ×3
ELECTRODE REM PT RTRN 9FT ADLT (ELECTROSURGICAL) ×1 IMPLANT
EXTRACTOR VACUUM M CUP 4 TUBE (SUCTIONS) IMPLANT
EXTRACTOR VACUUM M CUP 4' TUBE (SUCTIONS)
GAUZE SPONGE 4X4 12PLY STRL (GAUZE/BANDAGES/DRESSINGS) ×6 IMPLANT
GLOVE BIOGEL PI IND STRL 6.5 (GLOVE) ×1 IMPLANT
GLOVE BIOGEL PI IND STRL 7.0 (GLOVE) ×1 IMPLANT
GLOVE BIOGEL PI INDICATOR 6.5 (GLOVE) ×2
GLOVE BIOGEL PI INDICATOR 7.0 (GLOVE) ×2
GLOVE SURG SS PI 6.0 STRL IVOR (GLOVE) ×3 IMPLANT
GOWN STRL REUS W/TWL LRG LVL3 (GOWN DISPOSABLE) ×6 IMPLANT
KIT ABG SYR 3ML LUER SLIP (SYRINGE) IMPLANT
NEEDLE HYPO 25X5/8 SAFETYGLIDE (NEEDLE) IMPLANT
NS IRRIG 1000ML POUR BTL (IV SOLUTION) ×3 IMPLANT
PACK C SECTION WH (CUSTOM PROCEDURE TRAY) ×3 IMPLANT
PAD ABD 7.5X8 STRL (GAUZE/BANDAGES/DRESSINGS) ×3 IMPLANT
PAD OB MATERNITY 4.3X12.25 (PERSONAL CARE ITEMS) ×3 IMPLANT
PENCIL SMOKE EVAC W/HOLSTER (ELECTROSURGICAL) ×3 IMPLANT
RTRCTR C-SECT PINK 25CM LRG (MISCELLANEOUS) IMPLANT
SEPRAFILM MEMBRANE 5X6 (MISCELLANEOUS) IMPLANT
STRIP CLOSURE SKIN 1/2X4 (GAUZE/BANDAGES/DRESSINGS) ×2 IMPLANT
SUT PLAIN 0 NONE (SUTURE) IMPLANT
SUT VIC AB 0 CT1 36 (SUTURE) ×12 IMPLANT
SUT VIC AB 4-0 KS 27 (SUTURE) ×3 IMPLANT
TOWEL OR 17X24 6PK STRL BLUE (TOWEL DISPOSABLE) ×3 IMPLANT
TRAY FOLEY CATH SILVER 14FR (SET/KITS/TRAYS/PACK) ×3 IMPLANT

## 2015-11-11 NOTE — Progress Notes (Signed)
BP cuff changed and BP repeated

## 2015-11-11 NOTE — Progress Notes (Signed)
Spoke with pt about infant safety, baby on back to sleep in crib with NO pillow.  Baby in crib when mom sleeping, NOT in bed with mom. [facility] teaching.

## 2015-11-11 NOTE — Progress Notes (Signed)
Patient ID: Monica Holloway, female   DOB: 05-16-1986, 30 y.o.   MRN: [id number] Labor Progress Note Monica Holloway is a 30 y.o. G2P0010 at 4922w3d presented for vaginal bleeding which was concerning for abruption given recent cocaine use.   S:Called to room urgently due to fetal bradycardia in the 60s.   O:  BP 112/67 mmHg  Pulse 63  Temp(Src) 97.5 F (36.4 C) (Axillary)  Resp 18  Ht 5\' 2"  (1.575 m)  Wt 170 lb (77.111 kg)  BMI 31.09 kg/m2  SpO2 99%  LMP 01/26/2015 EFM: 123/min/no accels, no decels  CVE: Dilation: 5 Effacement (%): 70 Cervical Position: Anterior Station: -2 Presentation: Vertex Exam by:: Ashantia Amaral MD  AROM, bloody, moderate amount FSE placed IUPC placed  A&P: 30 y.o. G2P0010 6022w3d here for likely abruption, IOL given amount bleeding #Labor: Currently on pitocin - titrate up appropriately #Pain: Epidural in place #FWB: Cat II #GBS neg  #Placental abruption: continue to monitor bleeding. Give strip becomes more concerning may need CS.  #Mode of Delivery: guarded given amt of bleeding.   Federico FlakeKimberly Niles Koben Daman, MD 6:36 AM

## 2015-11-11 NOTE — Transfer of Care (Signed)
Immediate Anesthesia Transfer of Care Note  Patient: Monica Holloway  Procedure(s) Performed: Procedure(s): CESAREAN SECTION (N/A)  Patient Location: PACU  Anesthesia Type:Epidural  Level of Consciousness: awake, alert , oriented and patient cooperative  Airway & Oxygen Therapy: Patient Spontanous Breathing and Patient connected to nasal cannula oxygen  Post-op Assessment: Report given to RN and Post -op Vital signs reviewed and stable  Post vital signs: Reviewed and stable  Last Vitals:  Filed Vitals:   11/11/15 1135 11/11/15 1140  BP:    Pulse: 79 68  Temp:    Resp:      Last Pain:  Filed Vitals:   11/11/15 1241  PainSc: 0-No pain      Patients Stated Pain Goal: 0 (11/11/15 0954)  Complications: No apparent anesthesia complications

## 2015-11-11 NOTE — Anesthesia Pain Management Evaluation Note (Signed)
  CRNA Pain Management Visit Note  Patient: Monica Holloway, 30 y.o., female  "Hello I am a [facility] the [facility]. We have an anesthesia team available at all times to provide care throughout the hospital, including epidural management and anesthesia for C-section. I don't know your plan for the delivery whether it a natural birth, water birth, IV sedation, nitrous supplementation, doula or epidural, but we want to meet your pain goals."   1.Was your pain managed to your expectations on prior hospitalizations?   No prior hospitalizations  2.What is your expectation for pain management during this hospitalization?     Epidural  3.How can we help you reach that goal? Epidural.  Record the patient's initial score and the patient's pain goal.   Pain: 0  Pain Goal: 0 [facility] wants you to be able to say your pain was always managed very well.  Monica Holloway 11/11/2015

## 2015-11-11 NOTE — Progress Notes (Signed)
Labor Progress Note Monica Holloway is a [age] y.o. G2P0010 at 2577w3d presented for vaginal bleeding which was concerning for abruption given recent cocaine use.   S:  S/p epidural and feeling warm/nauseated. Patient is intermittently moaning but distractable. Discussed with patient positive cocaine in urine.   O:  BP 112/67 mmHg  Pulse 63  Temp(Src) 97.5 F (36.4 C) (Axillary)  Resp 18  Ht 5\' 2"  (1.575 m)  Wt 170 lb (77.111 kg)  BMI 31.09 kg/m2  SpO2 99%  LMP 01/26/2015 EFM: 120/min/no accels, no decels  CVE: Dilation: 5 Effacement (%): 70 Cervical Position: Anterior Station: -2 Presentation: Vertex Exam by:: Glenroy Crossen  50 cc clot was evacuated from the LUS  A&P: 30 y.o. G2P0010 7077w3d here for likely abruption, IOL given amount bleeding #Labor: Currently on pitocin  #Pain: Epidural in place #FWB: Cat II #GBS neg  #Placental abruption: continue to monitor bleeding. Give strip becomes more concerning may need CS.  #Mode of Delivery: guarded given amt of bleeding.   Federico FlakeKimberly Niles Candelario Steppe, MD 6:22 AM

## 2015-11-11 NOTE — Progress Notes (Signed)
When RN arrived in room for initial assessment found bed positioned as high as possible.  Explained need to lower bed for safety reasons and pt became very agitated, said "I have anxiety issues and need bed to be as high as possible."  Discussed fall concerns at length.  Pt. leaned out of bed and raised bed back up to high height.  Stated for RN to "leave this bed alone - I was fine before you came in."  Reinforced safety issues but put SR up x 4 and discussed not getting out of bed without RN in room.  Family in room and also talked with pt about "being unreasonable"  but pt insists on bed being as high as possible.

## 2015-11-11 NOTE — Progress Notes (Signed)
[employer] investment bankerhugger gown and blanket on.  Dr Gentry RochJudd aware of temp

## 2015-11-11 NOTE — Progress Notes (Signed)
Patient ID: Tommi RumpsBrittany L Teti, female   DOB: 11/27/1985, 30 y.o.   MRN: [id number] 30 yo G2P0010 at 3378w3d admitted with likely abruption in labor. Patient with repetitive decelerations and worsening vaginal bleeding. Patient without cervical change in the past hour due to the inability to augment labor. Patient was consented for delivery via cesarean section. Risks, benefits and alternatives were explained including but not limited to risks of bleeding, infection and damage to adjacent organs. Patient verbalized understanding and all questions were answered.

## 2015-11-11 NOTE — Progress Notes (Signed)
RN questioned terbutaline order with vaginal bleeding and maternal BP.  Attending, fellow and anesthesia at bediside agree to give terbutaline.

## 2015-11-11 NOTE — Anesthesia Postprocedure Evaluation (Addendum)
Anesthesia Post Note  Patient: Tommi RumpsBrittany L Tartaglia  Procedure(s) Performed: Procedure(s) (LRB): CESAREAN SECTION (N/A)  Patient location during evaluation: PACU Anesthesia Type: Epidural Level of consciousness: awake and alert Pain management: pain level controlled Vital Signs Assessment: post-procedure vital signs reviewed and stable Respiratory status: spontaneous breathing, nonlabored ventilation, respiratory function stable and patient connected to nasal cannula oxygen Cardiovascular status: blood pressure returned to baseline and stable Postop Assessment: no signs of nausea or vomiting, patient able to bend at knees and epidural receding Anesthetic complications: no Comments: Patient first arrived to PACU complaining of difficulty coughing and shortness of breath. CXR ordered and clear. Respirations improved as epidural block resolved. Saturations in upper 90s and 100s. Incentive spirometry also given. Temperature low upon admission. OB team aware and warmers in place. This improved as well. Patient still to go to ICU for further postoperative monitoring.      Last Vitals:  Filed Vitals:   11/11/15 1615 11/11/15 1630  BP: 118/81 97/55  Pulse: 59 59  Temp:    Resp: 18 18    Last Pain:  Filed Vitals:   11/11/15 1632  PainSc: 0-No pain   Pain Goal: Patients Stated Pain Goal: 0 (11/11/15 0954)               Georgianna Band JENNETTE

## 2015-11-11 NOTE — Op Note (Signed)
Monica RumpsBrittany L Holloway PROCEDURE DATE: 11/10/2015 - 11/11/2015  PREOPERATIVE DIAGNOSIS: Intrauterine pregnancy at  458w3d weeks gestation; abruptio placenta and non-reassuring fetal status  POSTOPERATIVE DIAGNOSIS: The same  PROCEDURE:     Cesarean Section  SURGEON:  Dr. Catalina AntiguaPeggy Kerah Hardebeck  ASSISTANT: Dr.M Ashok PallWouk  INDICATIONS: Monica Holloway is a 30 y.o. G2P1011 at 5058w3d scheduled for cesarean section secondary to abruptio placenta and non-reassuring fetal status.  Patient with history of polysubstance abuse who admitted to cocaine use the day prior to admission. The risks of cesarean section discussed with the patient included but were not limited to: bleeding which may require transfusion or reoperation; infection which may require antibiotics; injury to bowel, bladder, ureters or other surrounding organs; injury to the fetus; need for additional procedures including hysterectomy in the event of a life-threatening hemorrhage; placental abnormalities wth subsequent pregnancies, incisional problems, thromboembolic phenomenon and other postoperative/anesthesia complications. The patient concurred with the proposed plan, giving informed written consent for the procedure.    FINDINGS:  Viable female infant in cephalic presentation.  Apgars 9 and 9, weight, 5 pounds and 11 ounces.  Bloody amniotic fluid.  Intact placenta with 10 cm blood clot delivered prior to the fetus, three vessel cord.  Normal uterus, fallopian tubes and ovaries bilaterally.  ANESTHESIA:    Spinal INTRAVENOUS FLUIDS:800 ml ESTIMATED BLOOD LOSS: 700 ml URINE OUTPUT:  475 ml SPECIMENS: Placenta sent to pathology COMPLICATIONS: None immediate  PROCEDURE IN DETAIL:  The patient received intravenous antibiotics and had sequential compression devices applied to her lower extremities while in the preoperative area.  She was then taken to the operating room where anesthesia was induced and was found to be adequate. A foley catheter was placed  into her bladder and attached to Monica Holloway gravity. She was then placed in a dorsal supine position with a leftward tilt, and prepped and draped in a sterile manner. After an adequate timeout was performed, a Pfannenstiel skin incision was made with scalpel and carried through to the underlying layer of fascia. The fascia was incised in the midline and this incision was extended bilaterally using the Mayo scissors. Kocher clamps were applied to the superior aspect of the fascial incision and the underlying rectus muscles were dissected off bluntly. A similar process was carried out on the inferior aspect of the facial incision. The rectus muscles were separated in the midline bluntly and the peritoneum was entered bluntly. The Alexis self-retaining retractor was introduced into the abdominal cavity. Attention was turned to the lower uterine segment where a transverse hysterotomy was made with a scalpel and extended bilaterally bluntly. The infant was successfully delivered, and cord was clamped and cut and infant was handed over to awaiting neonatology team. Cord gases were sent Uterine massage was then administered and the placenta delivered intact with three-vessel cord. The uterus was cleared of clot and debris.  The hysterotomy was closed with 0 Vicryl in a running locked fashion, and an imbricating layer was also placed with a 0 Vicryl. Overall, excellent hemostasis was noted. The pelvis copiously irrigated and cleared of all clot and debris. Hemostasis was confirmed on all surfaces.  The peritoneum and the muscles were reapproximated using 0 vicryl interrupted stitches. The fascia was then closed using 0 Vicryl in a running fashion.  The subcutaneous layer was reapproximated with plain gut and the skin was closed in a subcuticular fashion using 3.0 Vicryl. The patient tolerated the procedure well. Sponge, lap, instrument and needle counts were correct x 2. She was  taken to the recovery room in stable condition.     [name],PEGGYMD  11/11/2015 12:58 PM

## 2015-11-11 NOTE — Progress Notes (Signed)
Pts Aunt Bluford MainKatrina Fuller spoke with nurse about pt, states pt lives in a motel and has an extensive drug history. She requests SW see pt, consult already ordered. Aunt available for assistance at [phone].

## 2015-11-12 ENCOUNTER — Encounter (HOSPITAL_COMMUNITY): Payer: Self-pay | Admitting: *Deleted

## 2015-11-12 LAB — CBC
HCT: 32.1 % — ABNORMAL LOW (ref 36.0–46.0)
Hemoglobin: 10.9 g/dL — ABNORMAL LOW (ref 12.0–15.0)
MCH: 32.6 pg (ref 26.0–34.0)
MCHC: 34 g/dL (ref 30.0–36.0)
MCV: 96.1 fL (ref 78.0–100.0)
PLATELETS: 289 10*3/uL (ref 150–400)
RBC: 3.34 MIL/uL — AB (ref 3.87–5.11)
RDW: 13.7 % (ref 11.5–15.5)
WBC: 14.1 10*3/uL — AB (ref 4.0–10.5)

## 2015-11-12 LAB — RPR: RPR Ser Ql: NONREACTIVE

## 2015-11-12 NOTE — Progress Notes (Signed)
[place] [facility] [facility] and requested that CSW be called when case has been assigned.

## 2015-11-12 NOTE — Progress Notes (Signed)
UR chart review completed.  

## 2015-11-12 NOTE — Progress Notes (Signed)
CPS worker/J. Flemming here to meet with MOB.  CSW has asked that he call CSW once he has completed his initial assessment today.  CSW spoke with RN staff who state MOB has been incoherent and inattentive to baby today.  Staff took baby to CN, but MOB came back to get the baby to take back to her room.  CSW spoke with pediatrician/W. Haddix to ask that she have baby taken back to CN if there is a safety concern from this point forward until MOB can demonstrate that she can be attentive.   CSW received message from MOB's aunt Laura Holderman.  CSW attempted to call her back and left message for her to call CSW. 

## 2015-11-12 NOTE — Anesthesia Postprocedure Evaluation (Signed)
Anesthesia Post Note  Patient: Monica RumpsBrittany L Toya  Procedure(s) Performed: Procedure(s) (LRB): CESAREAN SECTION (N/A)  Patient location during evaluation: Mother Baby Anesthesia Type: Epidural Level of consciousness: awake and alert Pain management: pain level controlled Vital Signs Assessment: post-procedure vital signs reviewed and stable Respiratory status: spontaneous breathing, nonlabored ventilation and respiratory function stable Cardiovascular status: stable Postop Assessment: no headache, no backache and epidural receding Anesthetic complications: no     Last Vitals:  Filed Vitals:   11/12/15 0500 11/12/15 0600  BP:    Pulse: 59 55  Temp:    Resp: 18 18    Last Pain:  Filed Vitals:   11/12/15 0655  PainSc: 1    Pain Goal: Patients Stated Pain Goal: 3 (11/12/15 [id number])               Junious SilkGILBERT,Rubina Basinski

## 2015-11-12 NOTE — Plan of Care (Signed)
Problem: Respiratory: Goal: Ability to maintain adequate ventilation will improve Outcome: Progressing Needs encouragement with IS

## 2015-11-12 NOTE — Progress Notes (Signed)
Mom in nursery and preparing to take her baby to her room via crib.  I explained to her that we are concerned about the safety of her baby because she was acting sleepy and not totaly alert.  She said she just napped and was not sleepy.  I reinforced the need for her to call her nurse if she starts to feel sleepy.  CPS representative in room with mom at present, and SW called to see mom./

## 2015-11-12 NOTE — Addendum Note (Signed)
Addendum  created 11/12/15 0804 by Junious SilkMelinda Yasuko Lapage, CRNA   Modules edited: Charges VN, Clinical Notes   Clinical Notes:  File: [id number]
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
## 2015-11-12 NOTE — Progress Notes (Signed)
Post Partum Day 1, POD1  Subjective:  Monica Holloway is a 30 y.o. G2P1011 540w3d s/p pLTCS 2/2 fetal indications in setting of abruption..  No acute events overnight.  Pt denies problems with ambulating, voiding or po intake.  She denies nausea or vomiting.  Pain is well controlled.  She has not had flatus.  Lochia Minimal.  Plan for birth control is Depo-Provera.  Method of Feeding: bottle. She has not yet spoken with SW about custody of her child. She desires father or aunt to care for her child.  Objective: Blood pressure 106/70, pulse 55, temperature 97.8 F (36.6 C), temperature source Oral, resp. rate 17, height 5\' 2"  (1.575 m), weight 170 lb (77.111 kg), last menstrual period 01/26/2015, SpO2 98 %, unknown if currently breastfeeding.  Physical Exam:  General: alert, cooperative and no distress Lochia:normal flow Chest: normal WOB Heart: Regular rate Abdomen: +BS, soft, mild TTP (appropriate) Uterine Fundus: firm, wound c/d/i DVT Evaluation: No evidence of DVT seen on physical exam. Extremities: no edema   Recent Labs  11/11/15 1751 11/12/15 0502  HGB 11.5* 10.9*  HCT 33.3* 32.1*    Assessment/Plan:  ASSESSMENT: Monica Holloway is a 30 y.o. G2P1011 1340w3d s/p pLTCS 2/2 fetal indications in setting of abruption, and polysubstance abuse, doing well.   #abruption with acute blood loss anemia: stable w hgb 10.9 today and pt is asx. No indication for additional iron supplementation at this time.  #polysubstance abuse: stable, desires sobriety but declines rehab. Will meet with SW today as Mom feels unsafe caring for her child and is interested in pursuing custody for a family member. -SW consult  #Postpartum care -Continue routine PP care -Plan for d/c POD 2-4    LOS: 2 days   Marina GoodellCaroline Mullin 11/12/2015, 10:05 AM   CNM attestation Post Partum Day #1 I have seen and examined this patient and agree with above documentation in the resident's note.   Monica RumpsBrittany L  Holloway is a 30 y.o. G2P1011 s/p pLTCS.  Pt denies problems with ambulating, voiding or po intake. Pain is well controlled.  Plan for birth control is Depo-Provera.  Method of Feeding: bottle (possible baby placement for adoption)  PE:  BP 126/71 mmHg  Pulse 72  Temp(Src) 98.1 F (36.7 C) (Oral)  Resp 18  Ht 5\' 2"  (1.575 m)  Wt 77.111 kg (170 lb)  BMI 31.09 kg/m2  SpO2 99%  LMP 01/26/2015  Breastfeeding? Unknown Fundus firm  Plan for discharge: 11/14/15  Cam HaiSHAW, KIMBERLY, CNM 3:34 PM

## 2015-11-13 NOTE — Progress Notes (Signed)
At 8:15am, CSW received message from Monica Holloway, whom she stated she would like her baby to go home with at discharge.  CSW called Monica Holloway back shortly after and spoke openly with her as Monica Holloway has given CSW permission to speak with her Monica.  Monica Holloway states she would like to care for baby, but is not willing to house Monica Holloway.  Monica Holloway states there is another family member, Monica Holloway's Monica Monica Holloway who "wants the baby."  Monica Holloway states, "I want the baby, but it doesn't matter, as long as the baby doesn't go with Monica Holloway (Monica Holloway)."  CSW explained to Monica Holloway that Monica Holloway informed CSW yesterday that she wanted her son to go home with "my Monica Holloway."  Monica replied, "she called me her Monica?  I'm her cousin."  Monica Holloway is eager to speak with someone from CPS regarding approval to be primary care giver.  CSW informed Monica Holloway that CSW is also awaiting a call from CPS and that it is CPS who helps in determining the discharge plan, not CSW.  Monica Holloway stated understanding.  She provided her address as [address], [place], [place] [address]. By approximately 10:30am, CSW had not heard from CPS regarding investigation/discharge plan.  CSW left message for CPS Supervisor/Monica Holloway.   At 1:15pm, Monica Holloway called CSW again to say she has not heard anything from CPS.  She states Monica Holloway's father and Monica are in the room with Monica Holloway and would like to speak with CSW.  She reports Monica Holloway needs rehab and asked how they can get Monica Holloway into a treatment.  CSW met with Monica Holloway, [name], Monica Holloway and Monica Holloway.  There was one other visitor who did not introduce herself.  CSW asked Monica Holloway if she would like to speak with CSW with her visitors present and she did not answer.  [name] said, "yes.  Yes she would."  CSW again directed the question specifically at Monica Holloway and she nodded in agreement.  CSW found Monica Holloway incredibly difficult to engage.  CSW informed her that CSW has spoken with her "Monica Holloway" Monica Holloway and that we are both still  awaiting a call from [facility].  Monica then said, "scratch that.  The baby is going home with her" and pointed to Monica Holloway across the room.  CSW explained that CSW does not make the disposition plan and that CSW needs to speak with CPS.  CSW asked Monica Holloway what her plan at discharge is and she did not answer.  CSW asked if she has thought more about substance abuse treatment and she said, "yeah.  I'll go there."  CSW explained the difference between inpatient and outpatient rehab and Monica Holloway states she is willing to go inpatient.  CSW stressed the importance of this being her decision and not something her family is telling her to do since she will need to be invested in the process.  She stated this is what she wants.  CSW informed her that CSW will be looking for a bed available and that it may be anywhere in the state.  She states understanding and willingness to go anywhere CSW can find.  CSW also informed Monica Holloway that it is not immediate and that she will have to stay in touch with CSW so that once a bed is located, we can complete the application process.  She provided her father and Monica Monica Holloway phone numbers as ways to reach her.  Monica's phone number  is [phone].  Monica Holloway's phone number is [phone].  Monica Holloway then offered his number, [phone]. At 2:30pm CSW left message with Monica Holloway/CPS intake asking that she locate a staff person to call CSW. At 2:35pm CSW left message with CPS Program Manager Monica Holloway stating the situation and that CSW needs to hear from worker or supervisor about a disposition plan as discharge is likely scheduled for tomorrow.   CSW left message with Monica Nose J./Perinatal Substance Specialist requesting inpatient substance treatment resources.   At 2:45pm, CSW received call from Monica Holloway/CPS intake.  CSW explained the situation and the urgency as discharge is approaching.  Monica Holloway contacted Supervisor/L. Monica Holloway and informed CSW that Supervisor will call CSW  within the next few minutes.  CSW also received an email from Program Manager/Monica Holloway who states she has been in contact with Supervisor who will be calling CSW. At 3:10pm, CSW received call from CPS worker/Monica Holloway.  CSW provided him with an update regarding contact with family and provided him with [place] Holloway phone number.  He states he will call her now and call CSW right back.

## 2015-11-13 NOTE — Progress Notes (Signed)
CSW received call from J. Flemming/CPS worker stating that baby has been approved to discharge to the care of MOB's Aunt Katrina Fuller/[phone] when medically ready.  CSW informed MOB's bedside RN, CN RN, and Lauren R./NP.  CSW asked staff to make a copy of K. Fuller's ID to be placed in baby's chart at discharge.   CSW received call back from Judith J./Perinatal Substance Abuse Specialist and has obtained a list of the beds available for inpatient substance treatment in [place].  Unfortunately, there are only two beds available in the state, both at [facility] in [place].  CSW explained this to MOB and provided her with the list so that she can call the facilities herself and get on their waiting lists.  MOB stated understanding.      

## 2015-11-13 NOTE — Progress Notes (Signed)
Flat affect today patient very sleepy this am.  Did not want me to unwrap baby to assess. Called into room.  Patient wanted me to take baby back to nursery so she could "heal"  Very agitated at times.   Family in to visit in the afternoon.  Very nurturing with baby. Patient continues to refuse to answer any questions asked by RN  Became a little more talkative at shift change
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
## 2015-11-14 LAB — TYPE AND SCREEN
ABO/RH(D): B POS
ANTIBODY SCREEN: POSITIVE
DAT, IgG: NEGATIVE
UNIT DIVISION: 0
Unit division: 0

## 2015-11-14 MED ORDER — OXYCODONE-ACETAMINOPHEN 5-325 MG PO TABS: 1.0000 | ORAL_TABLET | Freq: Four times a day (QID) | ORAL | Status: DC | PRN

## 2015-11-14 MED ORDER — SENNOSIDES-DOCUSATE SODIUM 8.6-50 MG PO TABS: 2.0000 | ORAL_TABLET | ORAL | Status: DC

## 2015-11-14 MED ORDER — OXYCODONE HCL 5 MG PO TABS: 5.0000 mg | ORAL_TABLET | ORAL | Status: DC | PRN

## 2015-11-14 MED ORDER — IBUPROFEN 600 MG PO TABS: 600.0000 mg | ORAL_TABLET | Freq: Four times a day (QID) | ORAL | Status: DC

## 2015-11-14 NOTE — Discharge Summary (Signed)
Obstetric Discharge Summary Reason for Admission: onset of labor Prenatal Procedures: none Intrapartum Procedures: cesarean: low cervical, transverse Postpartum Procedures: none Complications-Operative and Postpartum: none  Please refer to operative note from 5/17.  Hospital Course:  Active Problems:   Vaginal bleeding in pregnancy   Monica Holloway is a 30 y.o. G2P1011 s/p pLTCS for non-reassuring fetal heart tones in setting of abruption. History of polysubstance abuse (cocaine, THC, ethanol) noted.  Patient was admitted 11/10/15.  She has postpartum course that was uncomplicated including no problems with ambulating, PO intake, urination, pain, or bleeding. The pt feels ready to go home and  will be discharged with outpatient follow-up.   Today: No acute events overnight.  Pt denies problems with ambulating, voiding or po intake.  She denies nausea or vomiting.  Pain is moderately controlled.  She has had flatus. She has not had bowel movement.  Lochia Minimal.  Plan for birth control is  Depo-Provera.  Method of Feeding: Bottle  Physical Exam:  General: alert, cooperative and no distress Lochia: appropriate Uterine Fundus: firm Incision: healing well DVT Evaluation: No evidence of DVT seen on physical exam.  H/H: Lab Results  Component Value Date/Time   HGB 10.9* 11/12/2015 05:02 AM   HCT 32.1* 11/12/2015 05:02 AM    Discharge Diagnoses: Term Pregnancy-delivered  Discharge Information: Date: 11/14/2015 Activity: pelvic rest Diet: routine  Medications: Percocet Breast feeding:  No: Bottle Condition: stable Instructions: refer to handout Discharge to: home     Medication List    TAKE these medications        ibuprofen 600 MG tablet  Commonly known as:  ADVIL,MOTRIN  Take 1 tablet (600 mg total) by mouth every 6 (six) hours.     multivitamin-prenatal 27-0.8 MG Tabs tablet  Take 1 tablet by mouth daily at 12 noon.     oxyCODONE 5 MG immediate release tablet   Commonly known as:  Oxy IR/ROXICODONE  Take 1 tablet (5 mg total) by mouth every 4 (four) hours as needed (pain scale 4-7).     senna-docusate 8.6-50 MG tablet  Commonly known as:  Senokot-S  Take 2 tablets by mouth daily.        Alpine ManteeRumley, DO 11/14/2015,8:56 AM  CNM attestation I have seen and examined this patient and agree with above documentation in the resident's note.   Monica Holloway is a 30 y.o. G2P1011 s/p pLTCS.   Pain is well controlled.  Plan for birth control is Depo-Provera.  Method of Feeding: bottle (baby for adoption)  PE:  BP 120/67 mmHg  Pulse 88  Temp(Src) 98.6 F (37 C) (Oral)  Resp 18  Ht 5\' 2"  (1.575 m)  Wt 77.111 kg (170 lb)  BMI 31.09 kg/m2  SpO2 98%  LMP 01/26/2015  Breastfeeding? Unknown Fundus firm   Recent Labs  11/11/15 1751 11/12/15 0502  HGB 11.5* 10.9*  HCT 33.3* 32.1*     Plan: discharge today - postpartum care discussed - f/u clinic in 6 weeks for postpartum visit   Cam HaiSHAW, [place] Krempasky, CNM 9:03 AM 11/14/2015

## 2015-11-14 NOTE — Progress Notes (Signed)
Post Partum Day #3 Subjective:  Monica Holloway is a 30 y.o. [id number] 5047w3d s/p c-section for non-reassuring fetal heart tones in setting of abruption. History of polysubstance abuse with cocaine, THC, and ethanol noted.  No acute events overnight.  Pt denies problems with ambulating, voiding or po intake.  She denies nausea or vomiting.  Pain is moderately controlled.  She has had flatus. She has not had bowel movement.  Lochia Minimal.  Plan for birth control is Depo-Provera.  Method of Feeding: Bottle  Objective: Blood pressure 120/67, pulse 88, temperature 98.6 F (37 C), temperature source Oral, resp. rate 18, height 5\' 2"  (1.575 m), weight 77.111 kg (170 lb), last menstrual period 01/26/2015, SpO2 98 %, unknown if currently breastfeeding.  Physical Exam:  General: alert, cooperative and no distress Lochia:normal flow Chest: CTAB Heart: RRR no m/r/g Abdomen: +BS, soft, nontender,  Uterine Fundus: firm, mildly tender DVT Evaluation: No evidence of DVT seen on physical exam. Extremities: no edema   Recent Labs  11/11/15 1751 11/12/15 0502  HGB 11.5* 10.9*  HCT 33.3* 32.1*    Assessment/Plan:  ASSESSMENT: Monica Holloway is a 30 y.o. G2P1011 8047w3d s/p pLTCS for NRFHTs in setting of abruption.  Discharge home.   Social Work: Baby approved for discharge to care of mother of baby's aunt Bluford MainKatrina Fuller. List of inpatient substance abuse beds obtained. [place] available in [place]; considering placement.   LOS: 4 days   [facility] Matre[facility]aleigh Rumley 11/14/2015, 6:58 AM   I have seen and examined this patient and I agree with the above. See Discharge Summary. Cam HaiSHAW, Mylin Gignac 9:03 AM 11/14/2015

## 2015-11-14 NOTE — Discharge Instructions (Signed)
Follow up with your Ob/Gyn in 6 weeks. Postpartum Care After Cesarean Delivery After you deliver your newborn (postpartum period), the usual stay in the hospital is 24-72 hours. If there were problems with your labor or delivery, or if you have other medical problems, you might be in the hospital longer.  While you are in the hospital, you will receive help and instructions on how to care for yourself and your newborn during the postpartum period.  While you are in the hospital:  It is normal for you to have pain or discomfort from the incision in your abdomen. Be sure to tell your nurses when you are having pain, where the pain is located, and what makes the pain worse.  If you are breastfeeding, you may feel uncomfortable contractions of your uterus for a couple of weeks. This is normal. The contractions help your uterus get back to normal size.  It is normal to have some bleeding after delivery.  For the first 1-3 days after delivery, the flow is red and the amount may be similar to a period.  It is common for the flow to start and stop.  In the first few days, you may pass some small clots. Let your nurses know if you begin to pass large clots or your flow increases.  Do not  flush blood clots down the toilet before having the nurse look at them.  During the next 3-10 days after delivery, your flow should become more watery and pink or brown-tinged in color.  Ten to fourteen days after delivery, your flow should be a small amount of yellowish-white discharge.  The amount of your flow will decrease over the first few weeks after delivery. Your flow may stop in 6-8 weeks. Most women have had their flow stop by 12 weeks after delivery.  You should change your sanitary pads frequently.  Wash your hands thoroughly with soap and water for at least 20 seconds after changing pads, using the toilet, or before holding or feeding your newborn.  Your intravenous (IV) tubing will be removed when  you are drinking enough fluids.  The urine drainage tube (urinary catheter) that was inserted before delivery may be removed within 6-8 hours after delivery or when feeling returns to your legs. You should feel like you need to empty your bladder within the first 6-8 hours after the catheter has been removed.  In case you become weak, lightheaded, or faint, call your nurse before you get out of bed for the first time and before you take a shower for the first time.  Within the first few days after delivery, your breasts may begin to feel tender and full. This is called engorgement. Breast tenderness usually goes away within 48-72 hours after engorgement occurs. You may also notice milk leaking from your breasts. If you are not breastfeeding, do not stimulate your breasts. Breast stimulation can make your breasts produce more milk.  Spending as much time as possible with your newborn is very important. During this time, you and your newborn can feel close and get to know each other. Having your newborn stay in your room (rooming in) will help to strengthen the bond with your newborn. It will give you time to get to know your newborn and become comfortable caring for your newborn.  Your hormones change after delivery. Sometimes the hormone changes can temporarily cause you to feel sad or tearful. These feelings should not last more than a few days. If these feelings last  longer than that, you should talk to your caregiver.  If desired, talk to your caregiver about methods of family planning or contraception.  Talk to your caregiver about immunizations. Your caregiver may want you to have the following immunizations before leaving the hospital:  Tetanus, diphtheria, and pertussis (Tdap) or tetanus and diphtheria (Td) immunization. It is very important that you and your family (including grandparents) or others caring for your newborn are up-to-date with the Tdap or Td immunizations. The Tdap or Td  immunization can help protect your newborn from getting ill.  Rubella immunization.  Varicella (chickenpox) immunization.  Influenza immunization. You should receive this annual immunization if you did not receive the immunization during your pregnancy.   This information is not intended to replace advice given to you by your health care provider. Make sure you discuss any questions you have with your health care provider.   Document Released: 03/07/2012 Document Reviewed: 03/07/2012 Elsevier Interactive Patient Education [employer]

## 2015-11-14 NOTE — Progress Notes (Signed)
  CSW continues to follow to coordinate with [place] CPS on D/C plans for baby. CSW spoke with CPS worker, Audie Clear at [phone]. He reports baby is going to MOB's aunt's home, [address]. MOB technically still has custody per CPS worker. CPS will be following case and will do a "care check" at the home on Monday.   Doreen Salvage, LCSW Clinical Social Worker Weekend [facility] coverage  [phone]
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
## 2015-11-20 ENCOUNTER — Ambulatory Visit (HOSPITAL_COMMUNITY)
Admission: RE | Admit: 2015-11-20 | Discharge: 2015-11-20 | Disposition: A | Discharge status: Home / Self Care | Payer: Medicaid Other | Attending: Psychiatry | Admitting: Psychiatry
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
## 2015-12-17 ENCOUNTER — Ambulatory Visit: Payer: Self-pay | Admitting: Obstetrics and Gynecology

## 2016-02-29 IMAGING — CR DG CHEST 2V
2 series · 2 of 2 positions shown · non-contrast
Comparison: Prior study from 12/28/2012

CLINICAL DATA: Initial evaluation for vomiting. Headache. Extremity
numbness.

EXAM:
CHEST  2 VIEW

[w chest lat]
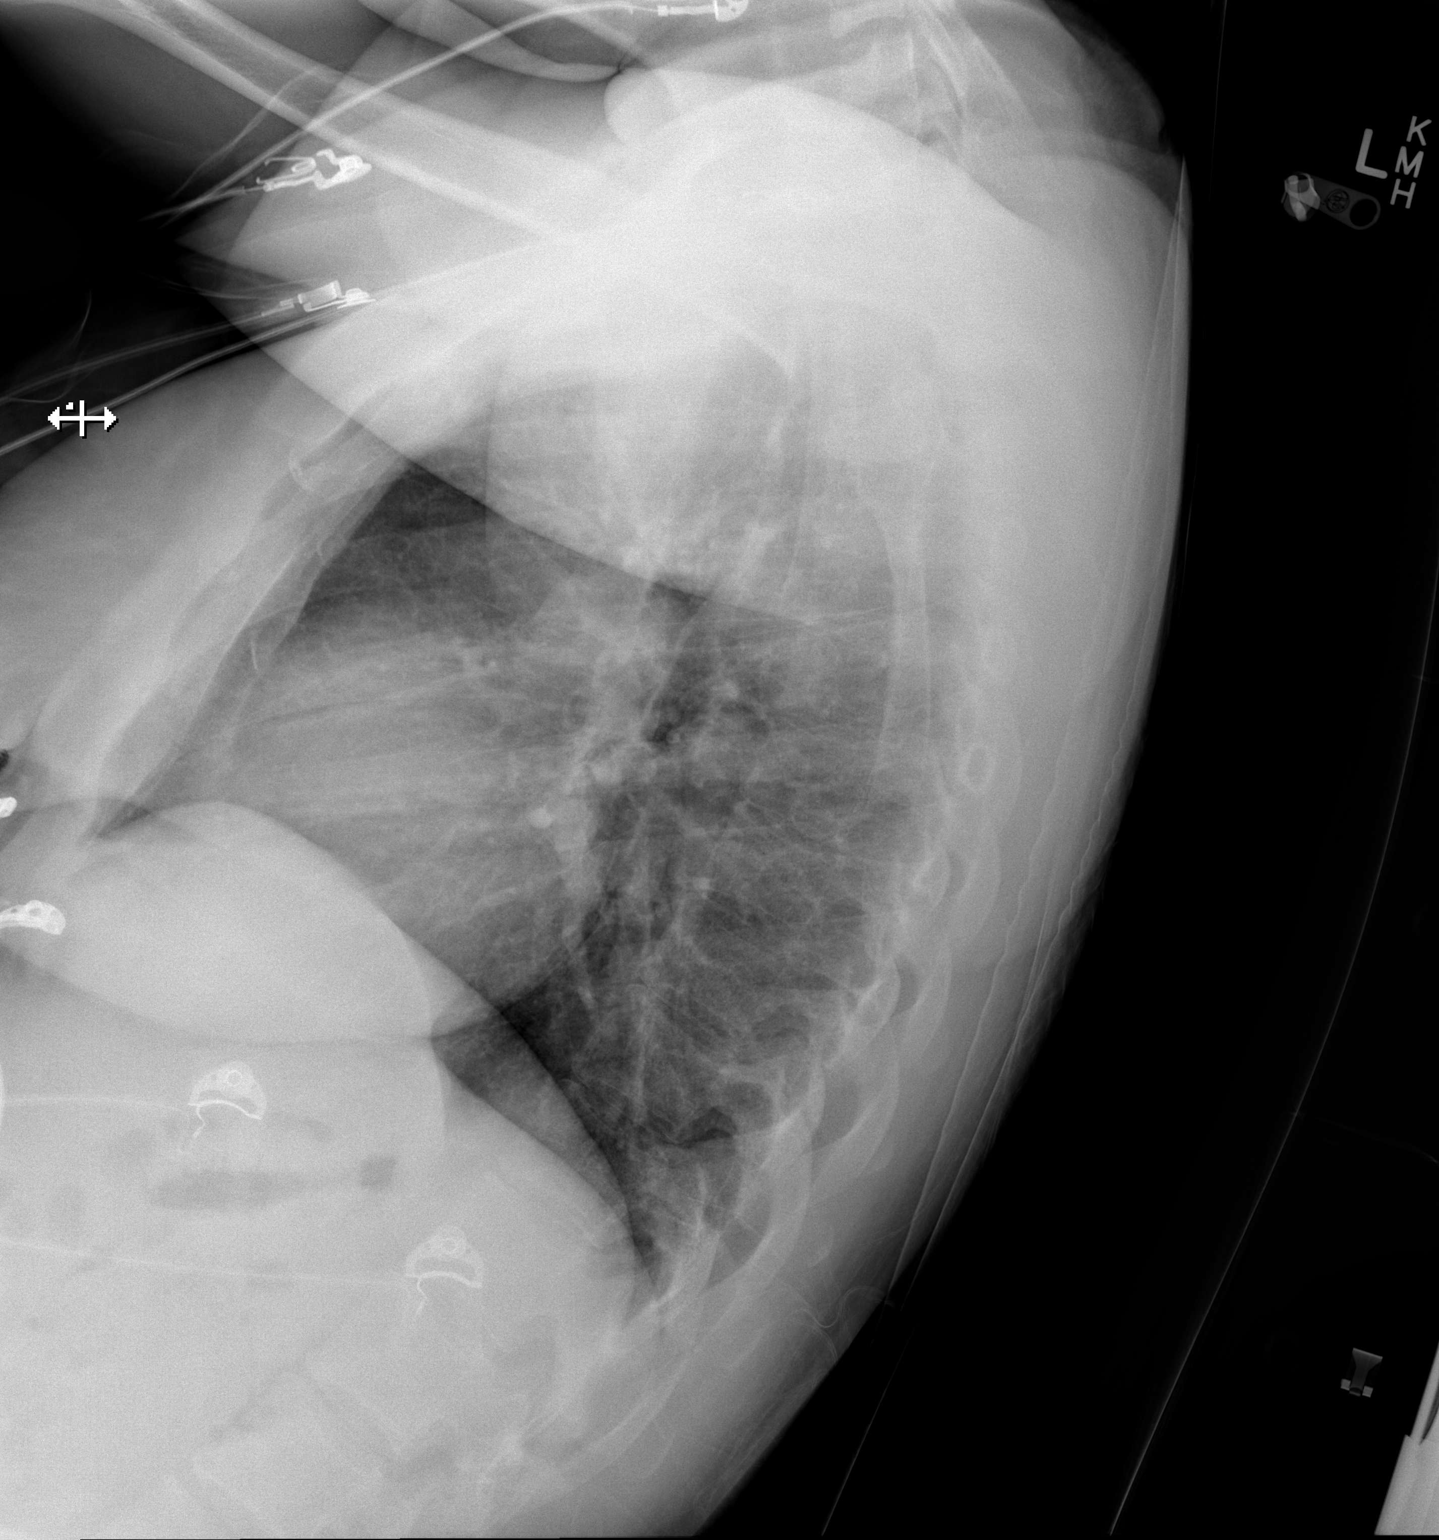

[x chest ap]
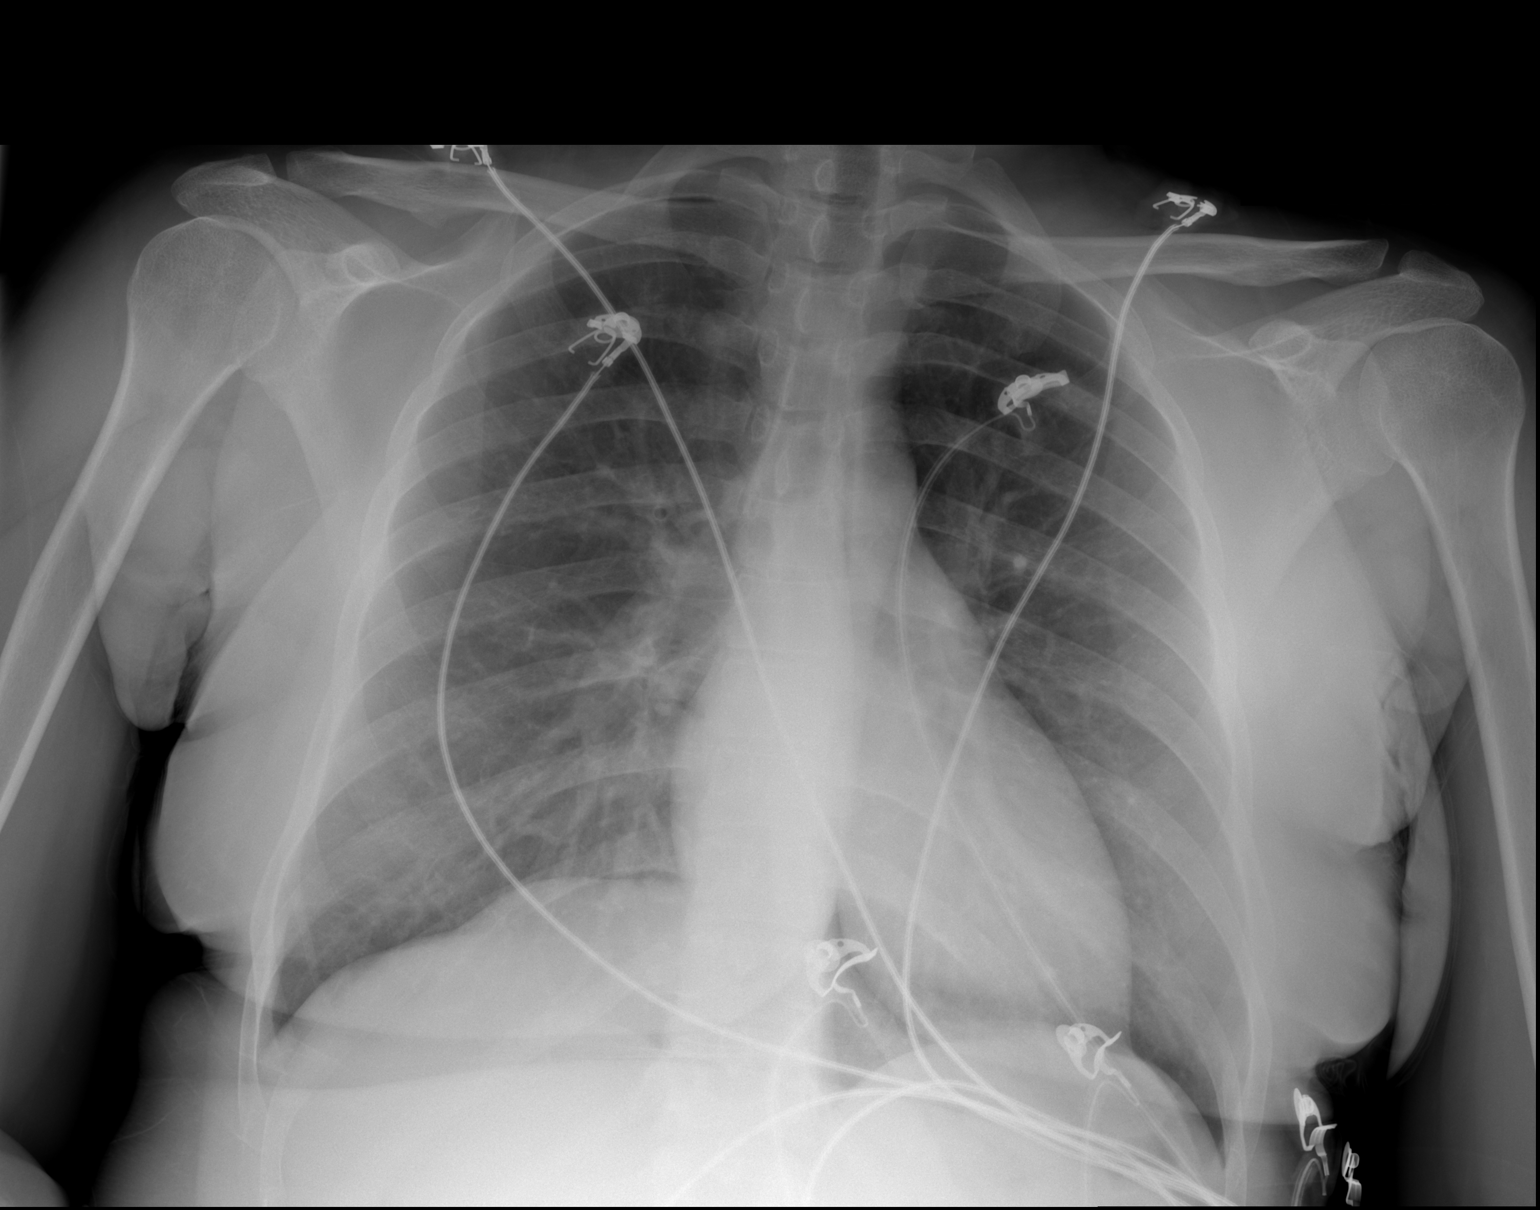

[2 of 2 positions shown; findings below may reference images not displayed]

FINDINGS: The cardiac and mediastinal silhouettes are stable in size and
contour, and remain within normal limits.

The lungs are normally inflated. No airspace consolidation, pleural
effusion, or pulmonary edema is identified. There is no
pneumothorax.

No acute osseous abnormality identified.
IMPRESSION: No active cardiopulmonary disease.

## 2016-03-01 ENCOUNTER — Encounter (HOSPITAL_COMMUNITY): Payer: Self-pay | Admitting: *Deleted

## 2016-03-01 ENCOUNTER — Emergency Department (HOSPITAL_COMMUNITY)
Admission: EM | Admit: 2016-03-01 | Discharge: 2016-03-01 | Disposition: A | Discharge status: Home / Self Care | Payer: Medicaid Other | Attending: Emergency Medicine | Admitting: Emergency Medicine

## 2016-03-01 DIAGNOSIS — Z5181 Encounter for therapeutic drug level monitoring: Secondary | ICD-10-CM | POA: Insufficient documentation

## 2016-03-01 DIAGNOSIS — W57XXXA Bitten or stung by nonvenomous insect and other nonvenomous arthropods, initial encounter: Secondary | ICD-10-CM

## 2016-03-01 DIAGNOSIS — Z79899 Other long term (current) drug therapy: Secondary | ICD-10-CM | POA: Insufficient documentation

## 2016-03-01 DIAGNOSIS — L299 Pruritus, unspecified: Secondary | ICD-10-CM | POA: Insufficient documentation

## 2016-03-01 DIAGNOSIS — F17210 Nicotine dependence, cigarettes, uncomplicated: Secondary | ICD-10-CM | POA: Insufficient documentation

## 2016-03-01 DIAGNOSIS — J45909 Unspecified asthma, uncomplicated: Secondary | ICD-10-CM | POA: Insufficient documentation

## 2016-03-01 LAB — COMPREHENSIVE METABOLIC PANEL
ALBUMIN: 4 g/dL (ref 3.5–5.0)
ALK PHOS: 76 U/L (ref 38–126)
ALT: 14 U/L (ref 14–54)
AST: 22 U/L (ref 15–41)
Anion gap: 8 (ref 5–15)
BILIRUBIN TOTAL: 1.2 mg/dL (ref 0.3–1.2)
BUN: 6 mg/dL (ref 6–20)
CALCIUM: 9.5 mg/dL (ref 8.9–10.3)
CO2: 21 mmol/L — ABNORMAL LOW (ref 22–32)
CREATININE: 0.9 mg/dL (ref 0.44–1.00)
Chloride: 107 mmol/L (ref 101–111)
GFR calc Af Amer: >60 mL/min (ref >60)
GFR calc non Af Amer: >60 mL/min (ref >60)
GLUCOSE: 91 mg/dL (ref 65–99)
Potassium: 3.8 mmol/L (ref 3.5–5.1)
Sodium: 136 mmol/L (ref 135–145)
TOTAL PROTEIN: 7.2 g/dL (ref 6.5–8.1)

## 2016-03-01 LAB — URINALYSIS, ROUTINE W REFLEX MICROSCOPIC
GLUCOSE, UA: 100 mg/dL — AB
KETONES UR: 15 mg/dL — AB
Nitrite: NEGATIVE
PH: 6 (ref 5.0–8.0)
PROTEIN: NEGATIVE mg/dL
Specific Gravity, Urine: 1.025 (ref 1.005–1.030)

## 2016-03-01 LAB — CBC WITH DIFFERENTIAL/PLATELET
BASOS ABS: 0.1 10*3/uL (ref 0.0–0.1)
Basophils Relative: 1 %
EOS PCT: 2 %
Eosinophils Absolute: 0.2 10*3/uL (ref 0.0–0.7)
HEMATOCRIT: 43.5 % (ref 36.0–46.0)
Hemoglobin: 14.5 g/dL (ref 12.0–15.0)
LYMPHS ABS: 2.8 10*3/uL (ref 0.7–4.0)
LYMPHS PCT: 37 %
MCH: 32.6 pg (ref 26.0–34.0)
MCHC: 33.3 g/dL (ref 30.0–36.0)
MCV: 97.8 fL (ref 78.0–100.0)
MONO ABS: 0.5 10*3/uL (ref 0.1–1.0)
MONOS PCT: 7 %
NEUTROS ABS: 3.9 10*3/uL (ref 1.7–7.7)
Neutrophils Relative %: 53 %
PLATELETS: 246 10*3/uL (ref 150–400)
RBC: 4.45 MIL/uL (ref 3.87–5.11)
RDW: 12.9 % (ref 11.5–15.5)
WBC: 7.4 10*3/uL (ref 4.0–10.5)

## 2016-03-01 LAB — URINE MICROSCOPIC-ADD ON

## 2016-03-01 LAB — I-STAT BETA HCG BLOOD, ED (MC, WL, AP ONLY): I-stat hCG, quantitative: <5 m[IU]/mL (ref <5)

## 2016-03-01 MED ORDER — SODIUM CHLORIDE 0.9 % IV BOLUS (SEPSIS): 500.0000 mL | Freq: Once | INTRAVENOUS | Status: DC

## 2016-03-01 MED ORDER — HYDROXYZINE HCL 10 MG PO TABS: 10.0000 mg | ORAL_TABLET | Freq: Four times a day (QID) | ORAL | 0 refills | Status: DC | PRN

## 2016-03-01 MED ORDER — HYDROXYZINE HCL 25 MG PO TABS
50.0000 mg | ORAL_TABLET | Freq: Once | ORAL | Status: AC
  Administered 2016-03-01: 50 mg via ORAL
  Filled 2016-03-01: qty 2

## 2016-03-01 NOTE — Discharge Instructions (Signed)
Substance Abuse Treatment Programs ° °Intensive Outpatient Programs °[facility]     °[address]      °[place], [place]                   °[phone]      ° °[facility] °[address] °[place], [place] °[phone] ° °[facility]     °(Inpatient and outpatient)     °[address]           °[phone]   ° °[facility] °[phone] (Suboxone and Methadone) ° °[address]      °[place], [place] [address]      °336-882-2125      ° °[address] °[place], [place] °852-3033 ° °[facility] (Outpatient/Inpatient, Chemical)    °(insurance only) [phone]      °       °[facility] (Groups & Residential) °[place], [place] °[phone] ° °   °[facility]     °[address]     °[place], [place]      °[phone]      ° °[facility] (for caregivers and family) °[address] °[place], [place] °336-299-4655 ° ° ° ° ° °Residential Treatment Programs °[facility]      °[address], [place], [place] [address]  °([phone]      ° °[facility] °[address] [place], [place] [address] °[phone] ° °[facility]        °336-248-8914      ° °[facility] °1-800-659-3381 ° °[facility]             °[address]                                         °[place], [place]                                                °877-615-2722 or 336-784-9470                              ° °[facility] [place] °[address] °[place], [address] °1.877.941.8954 ° °[facility]    °[address]      °[place], [place]     °[phone]      ° °[facility] °[address] °[place], [place] °336-285-9073 ° °[facility]   °[address]     °[place], [place] [address]     °[phone]      °Admissions: 8am-3pm M-F ° °[facility] °[address] °[place],  [place] °336-227-7417 ° °[facility]: Residential Program (90 Days)   °[place], [place]      °336-725-8389 or 800-758-6077    ° °[facility] °[place], [place] °(Walk in Hours over the weekend or by referral) ° °[facility] °[address], [place],  [address] °([phone] ° °Crisis Mobile: [facility]:  1-877-626-1772 (for crisis response 24 hours a day) °[facility] Hotline:      1-800-256-2452 °Outpatient Psychiatry and Counseling ° °[facility]: Mobile Crisis   Management 24 hours:  1-877-626-1772 ° °[facility] sliding scale fee and walk in schedule: M-F 8am-12pm/1pm-3pm °[address]  °[place], [place] [address] °[phone] ° °[facility] °[address] °[place], [place] [address] °336-703-9650 ° °[facility] ([facility] new patient walk-in appointments available Monday - Friday 8am -3pm.          °[address] °[place], [place] [address] °336-676-6840 or crisis line- 336-676-6905 ° °[facility]/ Intensive Outpatient Therapy Program °[address] °[place], [place] [address] °336-832-9804 ° °[facility]                  °Crisis Services      °[phone]      °[address]     °[place], [place] [address]                ° °[facility]   °[facility] °[phone] °[address] °[place], [place] [address] ° ° °[facility]          °[address]  °[place], [place] [address]       °([phone] ° °[facility] °[address] °[place], [place] [address] °336-292-1510 ° °[facility]    °[address]    °[place], [place] [address]     °[phone]      ° °Monica McKinney, MD     °[address]     °[place] [address]     °336-282-1251     °  °[facility] °[address] °[place] [address] ° °[facility]     °[address]     °[place], [place]      °336-542-2076      ° °[facility] °Shamsher Ahluwalia, MD °[address] °[place], [place] [address] °[phone] ° °[facility]     °[address]     °[place], [place] [address]     °336-274-1237      ° °[facility] °[address] °[place], [place] [address] °336-854-4450 °Resources for Temporary Residential Assistance/Crisis Centers ° °DAY CENTERS °[facility] °M-F 8am-3pm   °[address] [place], [place] [address]   336-332-0824 °Services include: laundry, barbering, support groups, case management, phone  & computer access, showers, AA/NA mtgs, mental health/substance abuse nurse, job skills class, disability information, VA assistance, spiritual classes, etc.  ° °HOMELESS SHELTERS ° °[facility]     °[facility]   °[address], [place]     °[phone]       °       °[facility] (women and children)       °[address] °[place], [place] [address] °336-275-0820 °[email or web address] for application and process °Application Required ° °[facility]   °[address]    °[place] [address]     °[phone]       °             °[facility] °[address] °[place],  [address] °336.273.5572 °336-235-0363(schedule application appt.) °Application Required ° °[facility] (women only)    °[address]     °[place],  [address]     °336-884-1039      °  Intake starts 6pm daily °Need valid ID, SSC, & Police report °[facility] [place] °[address] °[place], [place] °336-881-5420 °Application Required ° °[facility] (men only)     °[address]      °[place], [place]     °[phone]      ° °[facility] °(Pregnant women only) °[address] °[place], [place] °336-275-0206 ° °[facility]      °[address]      °[place], [place] [address]     °[phone]      °       °[facility] °[address] °[place], [place] °336-723-1848 °90 day commitment/SA/Application process ° °[facility](men only)     °[address]     °[place], [place]     °[phone]       °Check-in at 7pm     °       °[facility] °[address] °[place], [place] [address] °336-248-6684 °Men/Women/Women and Children must be there by 7 pm ° °[facility] °[place], [place] °336-722-8721                ° °

## 2016-03-01 NOTE — ED Provider Notes (Signed)
HPI Comments: Monica Holloway is a 30 y.o. female who presents to the Emergency Department complaining of multiple insect bites. Pt states she was bitten across her body and also complains of multiple bites to her vaginal region. She states that they are "all over her body" but cannot specify the location of the bites. Pt reports associated itchiness and redness across the affected regions. She states the bed she slept in has had bed bugs in the past and they "just put a plastic sheet over it and haven't gotten rid of them". Pt denies being on any new medications, lotions or soaps.  PE: Pt is squirming everywhere and continually itches her vaginal region which is newly shaven. There are no visual bug bites on her body. She is scratching diffusely across her body. She does not make good eye contact during examination and is a poor Visual merchandisercommunicator.   Plan: Pt with generalized pruritis with no known cause. Will order CMP to look at liver enzymes and benadryl for her itchiness. Have high suspicion of a psychological component regarding her presentation.    Arthor Captainbigail Angeleen Horney, PA-C 03/01/16 1046    Cathren LaineKevin Steinl, MD 03/01/16 [phone]

## 2016-03-01 NOTE — ED Notes (Signed)
Patient complains of bed bugs, patient does not appear to have bug bites.

## 2016-03-01 NOTE — ED Notes (Signed)
This RN with help of Chestine Sporelark, RN attempted IV start x2 unsuccessful. Pt willing to drink water po and to try again.

## 2016-03-01 NOTE — BHH Counselor (Signed)
Writer spoke w/ Will Dancy PA-C. Dancy reports he is removing TTS consult after consulting EDP Steinl.   Evette Cristalaroline Paige Murna Backer, [place] Therapeutic Triage Specialist

## 2016-03-01 NOTE — ED Provider Notes (Signed)
[facility] DEPT Provider Note   CSN: [id number] Arrival date & time: 03/01/16  6578     History   Chief Complaint Chief Complaint  Patient presents with  . Pruritis    HPI Monica Holloway is a 30 y.o. female.  Monica Holloway is a 30 y.o. Female who presents to the ED complaining of itching all over her body for the past 3 days. She reports she thinks she has bedbugs. She reports they're all over her body and she is itching all over her body. She reports staying at the extended stay motel and her friend has bed bugs as well. She is not very cooperative with history. She keeps repeating that she is itching all over including her vagina. She denies vaginal discharge or bleeding. She cannot specify areas that are more itchy than others her where she has bites on her body. She is unsure if she seen any bugs on her body. She is taking nothing for treatment of her symptoms today. She does report she occasionally drinks beer and uses marijuana. She denies history of SI or HI. She denies visual or auditory hallucinations. She denies history of mental illness, but does endorse history of substance abuse. She denies fevers, coughing, chest pain, nausea, vomiting, diarrhea, urinary symptoms, vaginal bleeding or vaginal discharge.    The history is provided by the patient. No language interpreter was used.    Past Medical History:  Diagnosis Date  . Asthma   . Panic attacks   . Panic disorder     Patient Active Problem List   Diagnosis Date Noted  . Vaginal bleeding in pregnancy 11/10/2015  . Polysubstance abuse 10/05/2015  . Trichomonal vaginitis during pregnancy, antepartum 10/05/2015  . BV (bacterial vaginosis) 08/19/2015  . Supervision of high risk pregnancy, antepartum 08/18/2015  . Cocaine use complicating pregnancy 08/18/2015    Past Surgical History:  Procedure Laterality Date  . CESAREAN SECTION N/A 11/11/2015   Procedure: CESAREAN SECTION;  Surgeon: Catalina Antigua, MD;   Location: [facility];  Service: Obstetrics;  Laterality: N/A;  . NO PAST SURGERIES      OB History    Gravida Para Term Preterm AB Living   2 1 1   1 1    SAB TAB Ectopic Multiple Live Births   1     0 1       Home Medications    Prior to Admission medications   Medication Sig Start Date End Date Taking? Authorizing Provider  hydrOXYzine (ATARAX/VISTARIL) 10 MG tablet Take 1 tablet (10 mg total) by mouth every 6 (six) hours as needed for itching. 03/01/16   Everlene Farrier, PA-C  ibuprofen (ADVIL,MOTRIN) 600 MG tablet Take 1 tablet (600 mg total) by mouth every 6 (six) hours. 11/14/15   Tuscarawas N Rumley, DO  oxyCODONE-acetaminophen (PERCOCET/ROXICET) 5-325 MG tablet Take 1-2 tablets by mouth every 6 (six) hours as needed. 11/14/15   Federico Flake, MD  Prenatal Vit-Fe Fumarate-FA (MULTIVITAMIN-PRENATAL) 27-0.8 MG TABS tablet Take 1 tablet by mouth daily at 12 noon.    Historical Provider, MD  senna-docusate (SENOKOT-S) 8.6-50 MG tablet Take 2 tablets by mouth daily. 11/14/15   Araceli Bouche, DO    Family History Family History  Problem Relation Age of Onset  . Cancer Mother   . Diabetes Father     Social History Social History  Substance Use Topics  . Smoking status: Current Every Day Smoker    Packs/day: 0.25    Years: 10.00  Types: Cigarettes  . Smokeless tobacco: Never Used  . Alcohol use Yes     Comment: occaisional      Allergies   Review of patient's allergies indicates no known allergies.   Review of Systems Review of Systems  Constitutional: Negative for chills and fever.  HENT: Negative for congestion and sore throat.   Eyes: Negative for visual disturbance.  Respiratory: Negative for cough and shortness of breath.   Cardiovascular: Negative for chest pain.  Gastrointestinal: Negative for abdominal pain, diarrhea, nausea and vomiting.  Genitourinary: Negative for decreased urine volume, difficulty urinating, dysuria, hematuria, urgency,  vaginal bleeding and vaginal discharge.  Musculoskeletal: Negative for back pain and neck pain.  Skin: Negative for rash.       Itching.   Neurological: Negative for syncope and headaches.     Physical Exam Updated Vital Signs BP 110/72 (BP Location: Right Arm)   Pulse 81   Temp 98.3 F (36.8 C) (Oral)   Resp 20   SpO2 100%   Physical Exam  Constitutional: She appears well-developed and well-nourished. No distress.  HENT:  Head: Normocephalic and atraumatic.  Mouth/Throat: Oropharynx is clear and moist.  Eyes: Conjunctivae are normal. Pupils are equal, round, and reactive to light. Right eye exhibits no discharge. Left eye exhibits no discharge.  Neck: Neck supple.  Cardiovascular: Normal rate, regular rhythm, normal heart sounds and intact distal pulses.   Pulmonary/Chest: Effort normal and breath sounds normal. No respiratory distress.  Abdominal: Soft. There is no tenderness.  Lymphadenopathy:    She has no cervical adenopathy.  Neurological: She is alert. Coordination normal.  Skin: Skin is warm and dry. Capillary refill takes less than 2 seconds. No rash noted. She is not diaphoretic. No erythema. No pallor.  Patient squirming in itching all over her body. No visual rashes or bites or bugs on her body. No rashes.  Psychiatric: Her speech is not rapid and/or pressured. She expresses no homicidal and no suicidal ideation.  Patient is irritable. She makes poor eye contact. She is a poor historian. She denies suicidal or homicidal ideations. She is itching all over her body including placing her hands down her pants to scratch her GU area.   Nursing note and vitals reviewed.    ED Treatments / Results  Labs (all labs ordered are listed, but only abnormal results are displayed) Labs Reviewed  COMPREHENSIVE METABOLIC PANEL - Abnormal; Notable for the following:       Result Value   CO2 21 (*)    All other components within normal limits  URINALYSIS, ROUTINE W REFLEX  MICROSCOPIC (NOT AT [facility]) - Abnormal; Notable for the following:    Glucose, UA 100 (*)    Hgb urine dipstick TRACE (*)    Bilirubin Urine MODERATE (*)    Ketones, ur 15 (*)    Leukocytes, UA SMALL (*)    All other components within normal limits  URINE MICROSCOPIC-ADD ON - Abnormal; Notable for the following:    Squamous Epithelial / LPF 0-5 (*)    Bacteria, UA FEW (*)    All other components within normal limits  CBC WITH DIFFERENTIAL/PLATELET  URINE RAPID DRUG SCREEN, HOSP PERFORMED  I-STAT BETA HCG BLOOD, ED ([facility], [facility], [facility] ONLY)    EKG  EKG Interpretation None       Radiology No results found.  Procedures Procedures (including critical care time)  Medications Ordered in ED Medications  sodium chloride 0.9 % bolus 500 mL (not administered)  hydrOXYzine (ATARAX/VISTARIL)  tablet 50 mg (50 mg Oral Given 03/01/16 1123)     Initial Impression / Assessment and Plan / ED Course  I have reviewed the triage vital signs and the nursing notes.  Pertinent labs & imaging results that were available during my care of the patient were reviewed by me and considered in my medical decision making (see chart for details).  Clinical Course   This is a 30 y.o. Female who presents to the ED complaining of itching all over her body for the past 3 days. She reports she thinks she has bedbugs. She reports they're all over her body and she is itching all over her body. She reports staying at the extended stay motel and her friend has bed bugs as well. She is not very cooperative with history. She keeps repeating that she is itching all over including her vagina. She denies vaginal discharge or bleeding. She cannot specify areas that are more itchy than others her where she has bites on her body. She is unsure if she has seen any bugs on her body. She is taking nothing for treatment of her symptoms today. She does report she occasionally drinks beer and uses marijuana. She denies history of SI or HI.  She denies visual or auditory hallucinations. She denies history of mental illness, but does endorse history of substance abuse.  On exam the patient is afebrile nontoxic appearing. No visible rashes or lesions identified. She is somewhat sleepy but this could be related to the Vistaril she was provided with. Her speech is clear and coherent. She denies suicidal or homicidal ideations. She denies visual or auditory hallucinations. She does not appear to be responding to internal stimuli. She denies seeing bugs on her body currently.  Pregnancy test is negative. Urinalysis shows small leukocytes and is nitrite negative. Urine sent for culture. Patient without urinary symptoms. CMP is unremarkable. Normal liver enzymes. Normal total bilirubin. CBC is within normal limits.  Dr. Denton Lank also evaluated the patient. She does not appear to be acutely mentally ill and I do not feel she would benefit from TTS consult at this time. She has been staying at a motel and around other people with bed bugs. I discussed how to get rid of bed bugs and encouraged her to contact an exterminator. Atarax for itching. I also provided with behavioral health resources just for her information. Patient reports she does not need this information. I advised the patient to follow-up with their primary care provider this week. I advised the patient to return to the emergency department with new or worsening symptoms or new concerns. The patient verbalized understanding and agreement with plan.    This patient was discussed with and evaluated by Dr. Denton Lank who agrees with assessment and plan.   Final Clinical Impressions(s) / ED Diagnoses   Final diagnoses:  Itching  Bed bug bite    New Prescriptions Discharge Medication List as of 03/01/2016  2:56 PM    START taking these medications   Details  hydrOXYzine (ATARAX/VISTARIL) 10 MG tablet Take 1 tablet (10 mg total) by mouth every 6 (six) hours as needed for itching., Starting Tue  03/01/2016, Print         Everlene Farrier, PA-C 03/01/16 1530    Cathren Laine, MD 03/01/16 1534

## 2016-03-01 NOTE — ED Notes (Signed)
Unsuccessful IV attempt x2.  

## 2016-03-01 NOTE — ED Notes (Signed)
Patient unable to provide sufficient volume of urine for both UDS and UA.  Provider instructs to do UA.

## 2016-03-01 NOTE — ED Triage Notes (Addendum)
Pt reports being exposed to bedbugs. Pt reports itching to legs and groin

## 2016-03-01 NOTE — ED Notes (Signed)
Patient refuses to provide urine specimen.

## 2016-03-02 LAB — URINE CULTURE

## 2016-06-27 NOTE — L&D Delivery Note (Signed)
Patient is a 31 y.o. now [id number] s/p NSVD at 2333w1d, who was admitted for SOL/TOLAC.  Delivery Note At 8:35 AM a viable female was delivered via VBAC, Spontaneous (Presentation: direct OA;  ).  APGAR: 8, 9; weight 6 lb 7.5 oz (2935 g).   Placenta status: intact 3VC:  with the following complications: none  Cord pH: pending  Anesthesia:  epidural Episiotomy: None Lacerations: 1st degree hemostatic, L labial/L periurtheral Suture Repair: n/a Est. Blood Loss (mL): 150  Head delivered directOA. No nuchal cord present. Shoulder and body delivered in usual fashion. Infant with spontaneous cry, placed on mother's abdomen, dried and bulb suctioned. Cord clamped x 2 after 1-minute delay, and cut by family member. Cord blood drawn. Placenta delivered spontaneously with gentle cord traction. Fundus firm with massage and Pitocin. Perineum inspected and found to have 1st degree hemostatic, L labial/L periurtheral laceration.  Mom to postpartum.  Baby to Couplet care / Skin to Skin.  Marthenia RollingScott Damacio Weisgerber 05/24/2017, 9:03 AM

## 2016-07-17 ENCOUNTER — Encounter (HOSPITAL_COMMUNITY): Payer: Self-pay | Admitting: *Deleted

## 2016-07-17 ENCOUNTER — Emergency Department (HOSPITAL_COMMUNITY)
Admission: EM | Admit: 2016-07-17 | Discharge: 2016-07-17 | Disposition: A | Discharge status: Home / Self Care | Payer: Self-pay | Attending: Emergency Medicine | Admitting: Emergency Medicine

## 2016-07-17 ENCOUNTER — Emergency Department (HOSPITAL_COMMUNITY): Payer: Self-pay

## 2016-07-17 DIAGNOSIS — Z5181 Encounter for therapeutic drug level monitoring: Secondary | ICD-10-CM | POA: Insufficient documentation

## 2016-07-17 DIAGNOSIS — W3409XA Accidental discharge from other specified firearms, initial encounter: Secondary | ICD-10-CM | POA: Insufficient documentation

## 2016-07-17 DIAGNOSIS — Y9302 Activity, running: Secondary | ICD-10-CM | POA: Insufficient documentation

## 2016-07-17 DIAGNOSIS — S41102A Unspecified open wound of left upper arm, initial encounter: Secondary | ICD-10-CM | POA: Insufficient documentation

## 2016-07-17 DIAGNOSIS — Y929 Unspecified place or not applicable: Secondary | ICD-10-CM | POA: Insufficient documentation

## 2016-07-17 DIAGNOSIS — S80211A Abrasion, right knee, initial encounter: Secondary | ICD-10-CM | POA: Insufficient documentation

## 2016-07-17 DIAGNOSIS — Z79899 Other long term (current) drug therapy: Secondary | ICD-10-CM | POA: Insufficient documentation

## 2016-07-17 DIAGNOSIS — Y999 Unspecified external cause status: Secondary | ICD-10-CM | POA: Insufficient documentation

## 2016-07-17 DIAGNOSIS — W3400XA Accidental discharge from unspecified firearms or gun, initial encounter: Secondary | ICD-10-CM

## 2016-07-17 LAB — PROTIME-INR
INR: 0.94
Prothrombin Time: 12.6 seconds (ref 11.4–15.2)

## 2016-07-17 LAB — CBC WITH DIFFERENTIAL/PLATELET
BASOS PCT: 1 %
Basophils Absolute: 0.1 10*3/uL (ref 0.0–0.1)
Eosinophils Absolute: 0.1 10*3/uL (ref 0.0–0.7)
Eosinophils Relative: 1 %
HEMATOCRIT: 44.5 % (ref 36.0–46.0)
HEMOGLOBIN: 14.7 g/dL (ref 12.0–15.0)
LYMPHS PCT: 41 %
Lymphs Abs: 2.1 10*3/uL (ref 0.7–4.0)
MCH: 31.9 pg (ref 26.0–34.0)
MCHC: 33 g/dL (ref 30.0–36.0)
MCV: 96.5 fL (ref 78.0–100.0)
MONO ABS: 0.4 10*3/uL (ref 0.1–1.0)
MONOS PCT: 7 %
NEUTROS ABS: 2.6 10*3/uL (ref 1.7–7.7)
NEUTROS PCT: 50 %
Platelets: 240 10*3/uL (ref 150–400)
RBC: 4.61 MIL/uL (ref 3.87–5.11)
RDW: 12.8 % (ref 11.5–15.5)
WBC: 5.2 10*3/uL (ref 4.0–10.5)

## 2016-07-17 LAB — TYPE AND SCREEN
ABO/RH(D): B POS
ANTIBODY SCREEN: POSITIVE
DAT, IGG: NEGATIVE
PT AG Type: NEGATIVE
Unit division: 0
Unit division: 0

## 2016-07-17 LAB — PREPARE FRESH FROZEN PLASMA
BLOOD PRODUCT EXPIRATION DATE: 201801232359
Blood Product Expiration Date: 201801232359
ISSUE DATE / TIME: 201801211425
ISSUE DATE / TIME: 201801211425
Unit Type and Rh: 6200
Unit Type and Rh: 6200

## 2016-07-17 LAB — ETHANOL

## 2016-07-17 LAB — I-STAT BETA HCG BLOOD, ED (MC, WL, AP ONLY)

## 2016-07-17 MED ORDER — BACITRACIN ZINC 500 UNIT/GM EX OINT: 1.0000 "application " | TOPICAL_OINTMENT | Freq: Two times a day (BID) | CUTANEOUS | 0 refills | Status: DC

## 2016-07-17 MED ORDER — SODIUM CHLORIDE 0.9 % IV SOLN
Freq: Once | INTRAVENOUS | Status: AC
  Administered 2016-07-17: 125 mL/h via INTRAVENOUS

## 2016-07-17 MED ORDER — TRAMADOL HCL 50 MG PO TABS: 100.0000 mg | ORAL_TABLET | Freq: Four times a day (QID) | ORAL | 0 refills | Status: DC | PRN

## 2016-07-17 MED ORDER — MORPHINE SULFATE (PF) 4 MG/ML IV SOLN
4.0000 mg | Freq: Once | INTRAVENOUS | Status: AC
  Administered 2016-07-17: 4 mg via INTRAVENOUS
  Filled 2016-07-17: qty 1

## 2016-07-17 MED ORDER — IBUPROFEN 800 MG PO TABS
800.0000 mg | ORAL_TABLET | Freq: Once | ORAL | Status: AC
  Administered 2016-07-17: 800 mg via ORAL
  Filled 2016-07-17: qty 1

## 2016-07-17 MED ORDER — IBUPROFEN 800 MG PO TABS: 800.0000 mg | ORAL_TABLET | Freq: Three times a day (TID) | ORAL | 0 refills | Status: DC

## 2016-07-17 NOTE — ED Notes (Signed)
PIV attempt x1 unsuccessful.

## 2016-07-17 NOTE — ED Notes (Signed)
Pt states she feels safe, that her Dad will pick her up and that she will stay with her Aunt.
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
## 2016-07-17 NOTE — ED Notes (Signed)
Pt father at bedside.

## 2016-07-18 ENCOUNTER — Encounter (HOSPITAL_COMMUNITY): Payer: Self-pay | Admitting: *Deleted

## 2016-07-31 ENCOUNTER — Encounter (HOSPITAL_COMMUNITY): Payer: Self-pay | Admitting: Emergency Medicine

## 2016-07-31 ENCOUNTER — Emergency Department (HOSPITAL_COMMUNITY)
Admission: EM | Admit: 2016-07-31 | Discharge: 2016-08-02 | Disposition: A | Discharge status: Other Acute Inpatient Hospital | Payer: Medicaid Other | Attending: Emergency Medicine | Admitting: Emergency Medicine

## 2016-07-31 DIAGNOSIS — Y9289 Other specified places as the place of occurrence of the external cause: Secondary | ICD-10-CM | POA: Insufficient documentation

## 2016-07-31 DIAGNOSIS — X58XXXA Exposure to other specified factors, initial encounter: Secondary | ICD-10-CM | POA: Insufficient documentation

## 2016-07-31 DIAGNOSIS — F1920 Other psychoactive substance dependence, uncomplicated: Secondary | ICD-10-CM | POA: Diagnosis present

## 2016-07-31 DIAGNOSIS — F1410 Cocaine abuse, uncomplicated: Secondary | ICD-10-CM | POA: Insufficient documentation

## 2016-07-31 DIAGNOSIS — S80211A Abrasion, right knee, initial encounter: Secondary | ICD-10-CM | POA: Insufficient documentation

## 2016-07-31 DIAGNOSIS — Z5181 Encounter for therapeutic drug level monitoring: Secondary | ICD-10-CM | POA: Insufficient documentation

## 2016-07-31 DIAGNOSIS — S80212A Abrasion, left knee, initial encounter: Secondary | ICD-10-CM | POA: Insufficient documentation

## 2016-07-31 DIAGNOSIS — Y999 Unspecified external cause status: Secondary | ICD-10-CM | POA: Insufficient documentation

## 2016-07-31 DIAGNOSIS — Y939 Activity, unspecified: Secondary | ICD-10-CM | POA: Insufficient documentation

## 2016-07-31 DIAGNOSIS — F1910 Other psychoactive substance abuse, uncomplicated: Secondary | ICD-10-CM | POA: Insufficient documentation

## 2016-07-31 LAB — CBC WITH DIFFERENTIAL/PLATELET
BASOS ABS: 0 10*3/uL (ref 0.0–0.1)
BASOS PCT: 0 %
EOS ABS: 0 10*3/uL (ref 0.0–0.7)
Eosinophils Relative: 0 %
HEMATOCRIT: 42.9 % (ref 36.0–46.0)
HEMOGLOBIN: 14.7 g/dL (ref 12.0–15.0)
Lymphocytes Relative: 5 %
Lymphs Abs: 0.6 10*3/uL — ABNORMAL LOW (ref 0.7–4.0)
MCH: 32.7 pg (ref 26.0–34.0)
MCHC: 34.3 g/dL (ref 30.0–36.0)
MCV: 95.5 fL (ref 78.0–100.0)
Monocytes Absolute: 0.6 10*3/uL (ref 0.1–1.0)
Monocytes Relative: 6 %
NEUTROS ABS: 10.2 10*3/uL — AB (ref 1.7–7.7)
NEUTROS PCT: 89 %
Platelets: 261 10*3/uL (ref 150–400)
RBC: 4.49 MIL/uL (ref 3.87–5.11)
RDW: 12.5 % (ref 11.5–15.5)
WBC: 11.5 10*3/uL — AB (ref 4.0–10.5)

## 2016-07-31 LAB — COMPREHENSIVE METABOLIC PANEL
ALBUMIN: 4 g/dL (ref 3.5–5.0)
ALK PHOS: 71 U/L (ref 38–126)
ALT: 14 U/L (ref 14–54)
ANION GAP: 14 (ref 5–15)
AST: 35 U/L (ref 15–41)
BILIRUBIN TOTAL: 0.7 mg/dL (ref 0.3–1.2)
BUN: 11 mg/dL (ref 6–20)
CALCIUM: 9.5 mg/dL (ref 8.9–10.3)
CO2: 19 mmol/L — ABNORMAL LOW (ref 22–32)
Chloride: 100 mmol/L — ABNORMAL LOW (ref 101–111)
Creatinine, Ser: 1.12 mg/dL — ABNORMAL HIGH (ref 0.44–1.00)
GFR calc Af Amer: >60 mL/min (ref >60)
GFR calc non Af Amer: >60 mL/min (ref >60)
GLUCOSE: 215 mg/dL — AB (ref 65–99)
Potassium: 4.3 mmol/L (ref 3.5–5.1)
SODIUM: 133 mmol/L — AB (ref 135–145)
Total Protein: 7.6 g/dL (ref 6.5–8.1)

## 2016-07-31 LAB — RAPID URINE DRUG SCREEN, HOSP PERFORMED
AMPHETAMINES: NOT DETECTED
Barbiturates: NOT DETECTED
Benzodiazepines: NOT DETECTED
Cocaine: POSITIVE — AB
OPIATES: NOT DETECTED
TETRAHYDROCANNABINOL: POSITIVE — AB

## 2016-07-31 LAB — CBG MONITORING, ED: GLUCOSE-CAPILLARY: 308 mg/dL — AB (ref 65–99)

## 2016-07-31 LAB — ETHANOL: Alcohol, Ethyl (B): <5 mg/dL (ref <5)

## 2016-07-31 LAB — I-STAT BETA HCG BLOOD, ED (MC, WL, AP ONLY): I-stat hCG, quantitative: <5 m[IU]/mL (ref <5)

## 2016-07-31 MED ORDER — LORAZEPAM 2 MG/ML IJ SOLN
2.0000 mg | Freq: Once | INTRAMUSCULAR | Status: AC
  Administered 2016-07-31: 2 mg via INTRAMUSCULAR

## 2016-07-31 MED ORDER — ZIPRASIDONE MESYLATE 20 MG IM SOLR
10.0000 mg | Freq: Once | INTRAMUSCULAR | Status: AC
  Administered 2016-07-31: 10 mg via INTRAMUSCULAR

## 2016-07-31 NOTE — ED Notes (Signed)
Per Janan HalterKim, Secretary, Magistrate received IVC paperwork.

## 2016-07-31 NOTE — ED Notes (Signed)
Pt assisted to bathroom to urinate by Daphine DeutscherMartin, EMT and Kennyth ArnoldStacy, RN.  Took 30 minutes trying to convince patient to use the bathroom.  Unsuccessful.  Had patient get back in bed since she was not doing what is asked of her.
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
## 2016-07-31 NOTE — ED Notes (Signed)
Snack and drink delivered to pt. Room 

## 2016-07-31 NOTE — ED Notes (Signed)
Hand cuffs being taken off at this time by [employer].

## 2016-07-31 NOTE — ED Notes (Signed)
Pt called her father - asking him if he was coming to visit her. Requested for pt to advise him of visitation time - pt did not do so - stated "he should know". Pt then returned to room.

## 2016-07-31 NOTE — ED Notes (Signed)
TTS being performed.  

## 2016-07-31 NOTE — ED Notes (Signed)
Unable to get vitals due to patient being uncooperative.

## 2016-07-31 NOTE — ED Notes (Signed)
No need to do in and out cath. Patient urinated on own.

## 2016-07-31 NOTE — ED Notes (Addendum)
[facility]. ED NT w/pt d/t no sitter available. Pt verbalized understanding and signed Medical Clearance Pt Policy form - copy given to pt and original placed on clipboard. Pt's father took her belongings per pt. Burgundy scrubs given for pt to change into.

## 2016-07-31 NOTE — ED Notes (Signed)
Called house coverage and was told that the ED normally does not get approved to have safety sitters.  At this time, a tech will have to sit with patient.

## 2016-07-31 NOTE — ED Notes (Signed)
Sitting on bed talking w/sitter.
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
## 2016-07-31 NOTE — ED Notes (Signed)
Pt escalating in the hallway wanting to go out to the car and meet her father who is bringing her clothing, pt was told staff would retreive clothing and lock it up for her she needed to stay here. Pt reports we cant keep her here against her will, security called and pt escorted back to room and BH ruled reinforced that pt was not able to leave at this time

## 2016-07-31 NOTE — ED Notes (Signed)
Assisted RN with giving IM shots

## 2016-07-31 NOTE — ED Notes (Signed)
Unable to complete triage questions due to patient being uncooperative.

## 2016-07-31 NOTE — ED Notes (Signed)
[facility] served [facility] copy sent to [facility] Records, original placed in folder for Magistrate - ALL 3 sets placed on clipboard.
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
## 2016-07-31 NOTE — ED Notes (Signed)
Pt asleep.  Handcuffs have been removed.  Sitter has been ordered for patient safety.  Pt directly across from nurses station.  Lights dimmed in room and door remains open at all times.  Unable to do in and out cath due to patient waking up and becoming combative.  At this time, feel that it is best that patient remain asleep.

## 2016-07-31 NOTE — ED Notes (Signed)
Pt asking to be given her IVC papers - advised unable to do so. Pt voiced understanding.
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
## 2016-07-31 NOTE — ED Notes (Signed)
Pt came back from shower with 2 cloth headbands that were removed and locked in cabinet in room

## 2016-08-01 DIAGNOSIS — F17210 Nicotine dependence, cigarettes, uncomplicated: Secondary | ICD-10-CM | POA: Diagnosis not present

## 2016-08-01 DIAGNOSIS — F1920 Other psychoactive substance dependence, uncomplicated: Secondary | ICD-10-CM | POA: Diagnosis not present

## 2016-08-01 DIAGNOSIS — F1290 Cannabis use, unspecified, uncomplicated: Secondary | ICD-10-CM | POA: Diagnosis not present

## 2016-08-01 DIAGNOSIS — F1099 Alcohol use, unspecified with unspecified alcohol-induced disorder: Secondary | ICD-10-CM

## 2016-08-01 DIAGNOSIS — F1490 Cocaine use, unspecified, uncomplicated: Secondary | ICD-10-CM

## 2016-08-01 MED ORDER — IBUPROFEN 800 MG PO TABS
800.0000 mg | ORAL_TABLET | Freq: Once | ORAL | Status: AC
  Administered 2016-08-01: 800 mg via ORAL
  Filled 2016-08-01: qty 1

## 2016-08-01 MED ORDER — IBUPROFEN 800 MG PO TABS
800.0000 mg | ORAL_TABLET | Freq: Four times a day (QID) | ORAL | Status: DC | PRN
Start: 2016-08-01 — End: 2016-08-02
  Administered 2016-08-01 – 2016-08-02 (×3): 800 mg via ORAL
  Filled 2016-08-01 (×3): qty 1

## 2016-08-01 NOTE — ED Triage Notes (Signed)
PT out of bed standing beside bed  Reporting I am going to walk out.

## 2016-08-01 NOTE — ED Triage Notes (Signed)
PT reports both hands are numb. Pt moves all fingers . Pt reached for pain meds and picked up pill with out problem . Pt then balled up both hands into a fist and held the cup of water between her wrists. Pt reports her hands were to numb to hold cup.

## 2016-08-01 NOTE — ED Triage Notes (Signed)
Security called to escort Pt and sitter to shower.

## 2016-08-01 NOTE — ED Notes (Signed)
Patient was given a snack and drink, and a Regular Diet taken for Lunch.

## 2016-08-01 NOTE — ED Triage Notes (Signed)
Second lunch ordered because Pt did not like what she ordered the first time.

## 2016-08-01 NOTE — ED Triage Notes (Signed)
Pt reported she wanted  To call Father . Pt instructed  She had made 2 calls today. Pt reported she was going to walk out if she could not talk on phone.
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
## 2016-08-01 NOTE — ED Notes (Signed)
Pt was given snack. Pt dinner meal has been placed. 

## 2016-08-01 NOTE — ED Notes (Signed)
Pt is taking shower.

## 2016-08-01 NOTE — ED Triage Notes (Signed)
PT  Talking on portable phone .

## 2016-08-01 NOTE — Progress Notes (Signed)
Inpatient treatment is recommended for pt per telepsych assessment completed today 2/5.  [facility]  [facility]. All other MH/SA facilities contacted are at capacity and not accepting referrals at this time. Cons[facility] upon bed availability as well.  Ilean SkillMeghan Magin Balbi, MSW, LCSW Clinical Social Work, Disposition  08/01/2016 [phone]

## 2016-08-01 NOTE — ED Notes (Signed)
Patient doing TTS at this time.

## 2016-08-01 NOTE — ED Triage Notes (Signed)
Pt talking to daughter in the room on portable phone . Pt refused to return phone after talking for .

## 2016-08-02 ENCOUNTER — Encounter (HOSPITAL_COMMUNITY): Payer: Self-pay | Admitting: *Deleted

## 2016-08-02 NOTE — ED Provider Notes (Signed)
  Physical Exam  BP 102/60 (BP Location: Left Arm)   Pulse 87   Temp 99.1 F (37.3 C) (Oral)   Resp 16   Ht 5\' 2"  (1.575 m)   Wt 175 lb (79.4 kg)   SpO2 100%   BMI 32.01 kg/m   Physical Exam  ED Course  Procedures  MDM [facility]. Reexamined before transfer.       Benjiman CoreNathan Shemica Meath, MD 08/02/16 1352

## 2016-08-02 NOTE — ED Notes (Signed)
Ordered pt breakfast tray

## 2016-08-02 NOTE — Progress Notes (Signed)
Pt accepted to [facility] health unit by Dr. Jeannine KittenFarah, report # [phone]. Chris in intake requested pt's social security number for registration purposes- per pt- #[phone].  Notified [facility].   Ilean SkillMeghan Patterson Hollenbaugh, MSW, LCSW Clinical Social Work, Disposition  08/02/2016 [phone]

## 2016-08-02 NOTE — Progress Notes (Signed)
Followed up on inpatient referrals. (Inpatient recommended per assessment completed 2/5). [facility] as well.  [facility]- no beds today but will check to see if referral can be reviewed for tomorrow [facility]- per Loetta RoughBrian  Zaiah Credeur, MSW, LCSW Clinical Social Work, Disposition  08/02/2016 [phone]

## 2016-10-13 ENCOUNTER — Emergency Department (HOSPITAL_COMMUNITY)
Admission: EM | Admit: 2016-10-13 | Discharge: 2016-10-13 | Discharge status: Against Medical Advice | Payer: Medicaid Other

## 2016-10-17 ENCOUNTER — Emergency Department (HOSPITAL_COMMUNITY)
Admission: EM | Admit: 2016-10-17 | Discharge: 2016-10-17 | Disposition: A | Discharge status: Home / Self Care | Payer: Medicaid Other | Attending: Emergency Medicine | Admitting: Emergency Medicine

## 2016-10-17 ENCOUNTER — Encounter (HOSPITAL_COMMUNITY): Payer: Self-pay

## 2016-10-17 ENCOUNTER — Emergency Department (HOSPITAL_COMMUNITY): Payer: Medicaid Other

## 2016-10-17 DIAGNOSIS — F17200 Nicotine dependence, unspecified, uncomplicated: Secondary | ICD-10-CM | POA: Insufficient documentation

## 2016-10-17 DIAGNOSIS — N939 Abnormal uterine and vaginal bleeding, unspecified: Secondary | ICD-10-CM

## 2016-10-17 DIAGNOSIS — E119 Type 2 diabetes mellitus without complications: Secondary | ICD-10-CM | POA: Diagnosis not present

## 2016-10-17 DIAGNOSIS — J45909 Unspecified asthma, uncomplicated: Secondary | ICD-10-CM | POA: Insufficient documentation

## 2016-10-17 DIAGNOSIS — Z3A08 8 weeks gestation of pregnancy: Secondary | ICD-10-CM

## 2016-10-17 DIAGNOSIS — R1033 Periumbilical pain: Secondary | ICD-10-CM

## 2016-10-17 DIAGNOSIS — Z3A01 Less than 8 weeks gestation of pregnancy: Secondary | ICD-10-CM | POA: Insufficient documentation

## 2016-10-17 DIAGNOSIS — Z79899 Other long term (current) drug therapy: Secondary | ICD-10-CM | POA: Diagnosis not present

## 2016-10-17 DIAGNOSIS — O009 Unspecified ectopic pregnancy without intrauterine pregnancy: Secondary | ICD-10-CM | POA: Diagnosis not present

## 2016-10-17 DIAGNOSIS — O26899 Other specified pregnancy related conditions, unspecified trimester: Secondary | ICD-10-CM | POA: Diagnosis present

## 2016-10-17 LAB — URINALYSIS, ROUTINE W REFLEX MICROSCOPIC
Bilirubin Urine: NEGATIVE
Glucose, UA: NEGATIVE mg/dL
HGB URINE DIPSTICK: NEGATIVE
Ketones, ur: 5 mg/dL — AB
NITRITE: NEGATIVE
PROTEIN: NEGATIVE mg/dL
SPECIFIC GRAVITY, URINE: 1.026 (ref 1.005–1.030)
pH: 6 (ref 5.0–8.0)

## 2016-10-17 LAB — COMPREHENSIVE METABOLIC PANEL
ALBUMIN: 4.1 g/dL (ref 3.5–5.0)
ALT: 10 U/L — AB (ref 14–54)
AST: 18 U/L (ref 15–41)
Alkaline Phosphatase: 56 U/L (ref 38–126)
Anion gap: 10 (ref 5–15)
BILIRUBIN TOTAL: 0.8 mg/dL (ref 0.3–1.2)
BUN: 7 mg/dL (ref 6–20)
CHLORIDE: 104 mmol/L (ref 101–111)
CO2: 22 mmol/L (ref 22–32)
CREATININE: 0.5 mg/dL (ref 0.44–1.00)
Calcium: 9.2 mg/dL (ref 8.9–10.3)
GFR calc Af Amer: >60 mL/min (ref >60)
GFR calc non Af Amer: >60 mL/min (ref >60)
GLUCOSE: 88 mg/dL (ref 65–99)
POTASSIUM: 3.6 mmol/L (ref 3.5–5.1)
Sodium: 136 mmol/L (ref 135–145)
Total Protein: 7.7 g/dL (ref 6.5–8.1)

## 2016-10-17 LAB — CBC
HEMATOCRIT: 43.6 % (ref 36.0–46.0)
Hemoglobin: 14.9 g/dL (ref 12.0–15.0)
MCH: 33.1 pg (ref 26.0–34.0)
MCHC: 34.2 g/dL (ref 30.0–36.0)
MCV: 96.9 fL (ref 78.0–100.0)
PLATELETS: 279 10*3/uL (ref 150–400)
RBC: 4.5 MIL/uL (ref 3.87–5.11)
RDW: 12.5 % (ref 11.5–15.5)
WBC: 6.9 10*3/uL (ref 4.0–10.5)

## 2016-10-17 LAB — RAPID URINE DRUG SCREEN, HOSP PERFORMED
Amphetamines: NOT DETECTED
BARBITURATES: NOT DETECTED
Benzodiazepines: NOT DETECTED
Cocaine: POSITIVE — AB
Opiates: NOT DETECTED
Tetrahydrocannabinol: POSITIVE — AB

## 2016-10-17 LAB — LIPASE, BLOOD: Lipase: 32 U/L (ref 11–51)

## 2016-10-17 LAB — HCG, SERUM, QUALITATIVE: PREG SERUM: POSITIVE — AB

## 2016-10-17 LAB — HCG, QUANTITATIVE, PREGNANCY: HCG, BETA CHAIN, QUANT, S: 120708 m[IU]/mL — AB (ref <5)

## 2016-10-17 LAB — POC URINE PREG, ED: Preg Test, Ur: POSITIVE — AB

## 2016-10-17 MED ORDER — SODIUM CHLORIDE 0.9 % IV BOLUS (SEPSIS)
1000.0000 mL | Freq: Once | INTRAVENOUS | Status: AC
  Administered 2016-10-17: 1000 mL via INTRAVENOUS

## 2016-10-17 MED ORDER — ONDANSETRON 4 MG PO TBDP: 4.0000 mg | ORAL_TABLET | Freq: Three times a day (TID) | ORAL | 0 refills | Status: DC | PRN

## 2016-10-17 NOTE — ED Notes (Signed)
Patient transported to Ultrasound 

## 2016-10-17 NOTE — ED Notes (Signed)
ED Provider at bedside. 

## 2016-10-17 NOTE — ED Notes (Signed)
IV removed by pt.  ?

## 2016-10-17 NOTE — ED Notes (Signed)
Pt refusing vital signs, states she is leaving now. Pt provided d/c paper, pt willing to sign for papers. Pt escorted out by Kenney Houseman, EMT.

## 2016-10-17 NOTE — ED Triage Notes (Signed)
Pt states she cannot remember the last time she had her menstrual cycle and that she has been experiencing burning in her abdominal area.
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
## 2016-10-17 NOTE — Discharge Instructions (Signed)
Please follow up with [facility] regarding today's visit. Ultrasound showed you are about 8 weeks and 6 days pregnant. Please take Zofran as needed for nausea and vomiting. Make sure to stay hydrated. Return to ED for any new or worsening symptoms.   Get help right away if: Your pain does not go away as soon as your health care provider told you to expect. You cannot stop throwing up. Your pain is only in areas of the abdomen, such as the right side or the left lower portion of the abdomen. You have bloody or black stools, or stools that look like tar. You have severe pain, cramping, or bloating in your abdomen. You have signs of dehydration, such as: Dark urine, very little urine, or no urine. Cracked lips. Dry mouth. Sunken eyes. Sleepiness. Weakness.

## 2016-10-18 LAB — URINE CULTURE

## 2016-11-02 ENCOUNTER — Encounter (HOSPITAL_COMMUNITY): Payer: Self-pay | Admitting: *Deleted

## 2016-11-02 ENCOUNTER — Inpatient Hospital Stay (HOSPITAL_COMMUNITY)
Admission: AD | Admit: 2016-11-02 | Discharge: 2016-11-02 | Disposition: A | Discharge status: Home / Self Care | Payer: Medicaid Other | Source: Ambulatory Visit | Attending: Family Medicine | Admitting: Family Medicine

## 2016-11-02 DIAGNOSIS — F17210 Nicotine dependence, cigarettes, uncomplicated: Secondary | ICD-10-CM | POA: Insufficient documentation

## 2016-11-02 DIAGNOSIS — O99512 Diseases of the respiratory system complicating pregnancy, second trimester: Secondary | ICD-10-CM | POA: Diagnosis not present

## 2016-11-02 DIAGNOSIS — O26899 Other specified pregnancy related conditions, unspecified trimester: Secondary | ICD-10-CM | POA: Diagnosis present

## 2016-11-02 DIAGNOSIS — R109 Unspecified abdominal pain: Secondary | ICD-10-CM | POA: Diagnosis present

## 2016-11-02 DIAGNOSIS — Z3A25 25 weeks gestation of pregnancy: Secondary | ICD-10-CM | POA: Insufficient documentation

## 2016-11-02 DIAGNOSIS — B9689 Other specified bacterial agents as the cause of diseases classified elsewhere: Secondary | ICD-10-CM | POA: Insufficient documentation

## 2016-11-02 DIAGNOSIS — O23592 Infection of other part of genital tract in pregnancy, second trimester: Secondary | ICD-10-CM | POA: Diagnosis not present

## 2016-11-02 DIAGNOSIS — J45909 Unspecified asthma, uncomplicated: Secondary | ICD-10-CM | POA: Insufficient documentation

## 2016-11-02 DIAGNOSIS — N760 Acute vaginitis: Secondary | ICD-10-CM | POA: Insufficient documentation

## 2016-11-02 DIAGNOSIS — O26892 Other specified pregnancy related conditions, second trimester: Secondary | ICD-10-CM | POA: Diagnosis present

## 2016-11-02 DIAGNOSIS — O99332 Smoking (tobacco) complicating pregnancy, second trimester: Secondary | ICD-10-CM | POA: Diagnosis not present

## 2016-11-02 DIAGNOSIS — O09892 Supervision of other high risk pregnancies, second trimester: Secondary | ICD-10-CM | POA: Diagnosis present

## 2016-11-02 HISTORY — DX: Other specified pregnancy related conditions, unspecified trimester: O26.899

## 2016-11-02 HISTORY — DX: Unspecified abdominal pain: R10.9

## 2016-11-02 LAB — COMPREHENSIVE METABOLIC PANEL
ALT: 12 U/L — AB (ref 14–54)
AST: 13 U/L — AB (ref 15–41)
Albumin: 3.9 g/dL (ref 3.5–5.0)
Alkaline Phosphatase: 47 U/L (ref 38–126)
Anion gap: 6 (ref 5–15)
BUN: 12 mg/dL (ref 6–20)
CHLORIDE: 102 mmol/L (ref 101–111)
CO2: 25 mmol/L (ref 22–32)
CREATININE: 0.47 mg/dL (ref 0.44–1.00)
Calcium: 9.2 mg/dL (ref 8.9–10.3)
GFR calc Af Amer: >60 mL/min (ref >60)
GLUCOSE: 91 mg/dL (ref 65–99)
Potassium: 3.9 mmol/L (ref 3.5–5.1)
Sodium: 133 mmol/L — ABNORMAL LOW (ref 135–145)
Total Bilirubin: 0.2 mg/dL — ABNORMAL LOW (ref 0.3–1.2)
Total Protein: 7.7 g/dL (ref 6.5–8.1)

## 2016-11-02 LAB — URINALYSIS, ROUTINE W REFLEX MICROSCOPIC
BILIRUBIN URINE: NEGATIVE
Glucose, UA: NEGATIVE mg/dL
Hgb urine dipstick: NEGATIVE
KETONES UR: NEGATIVE mg/dL
Nitrite: NEGATIVE
Protein, ur: NEGATIVE mg/dL
Specific Gravity, Urine: 1.025 (ref 1.005–1.030)
pH: 6 (ref 5.0–8.0)

## 2016-11-02 LAB — WET PREP, GENITAL
Sperm: NONE SEEN
Trich, Wet Prep: NONE SEEN
Yeast Wet Prep HPF POC: NONE SEEN

## 2016-11-02 MED ORDER — METRONIDAZOLE 0.75 % VA GEL: 1.0000 | Freq: Every day | VAGINAL | 0 refills | Status: AC
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
## 2016-11-02 NOTE — MAU Note (Signed)
Urine in lab 

## 2016-11-02 NOTE — MAU Note (Signed)
Wants to confirm pregnancy and see how far along her pregnancy is; c/o upper abdominal pain since 1400 today;

## 2016-11-02 NOTE — MAU Note (Signed)
By pt's LMP, she should be [redacted] weeks gestation; unable to hear FHR;

## 2016-11-03 LAB — GC/CHLAMYDIA PROBE AMP (~~LOC~~) NOT AT ARMC
CHLAMYDIA, DNA PROBE: NEGATIVE
Neisseria Gonorrhea: NEGATIVE

## 2016-11-29 ENCOUNTER — Encounter: Payer: Self-pay | Admitting: *Deleted

## 2016-12-02 ENCOUNTER — Encounter: Payer: Self-pay | Admitting: Family Medicine

## 2016-12-02 ENCOUNTER — Ambulatory Visit (INDEPENDENT_AMBULATORY_CARE_PROVIDER_SITE_OTHER): Payer: Medicaid Other | Admitting: Family Medicine

## 2016-12-02 VITALS — BP 99/61 | HR 71 | Wt 160.3 lb

## 2016-12-02 DIAGNOSIS — O34219 Maternal care for unspecified type scar from previous cesarean delivery: Secondary | ICD-10-CM

## 2016-12-02 DIAGNOSIS — O99322 Drug use complicating pregnancy, second trimester: Secondary | ICD-10-CM | POA: Diagnosis not present

## 2016-12-02 DIAGNOSIS — Z98891 History of uterine scar from previous surgery: Secondary | ICD-10-CM

## 2016-12-02 DIAGNOSIS — O0992 Supervision of high risk pregnancy, unspecified, second trimester: Secondary | ICD-10-CM | POA: Diagnosis present

## 2016-12-02 DIAGNOSIS — F1920 Other psychoactive substance dependence, uncomplicated: Secondary | ICD-10-CM

## 2016-12-02 DIAGNOSIS — O0990 Supervision of high risk pregnancy, unspecified, unspecified trimester: Secondary | ICD-10-CM

## 2016-12-02 LAB — POCT URINALYSIS DIP (DEVICE)
Bilirubin Urine: NEGATIVE
GLUCOSE, UA: NEGATIVE mg/dL
HGB URINE DIPSTICK: NEGATIVE
NITRITE: NEGATIVE
PROTEIN: NEGATIVE mg/dL
UROBILINOGEN UA: 1 mg/dL (ref 0.0–1.0)
pH: 6.5 (ref 5.0–8.0)
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
## 2016-12-04 LAB — URINE CULTURE, OB REFLEX: ORGANISM ID, BACTERIA: NO GROWTH

## 2016-12-04 LAB — CULTURE, OB URINE

## 2016-12-06 LAB — AFP TETRA
DIA MOM VALUE: 0.55
DIA Value (EIA): 97.54 pg/mL
DSR (BY AGE) 1 IN: 773
DSR (Second Trimester) 1 IN: 6832
Gestational Age: 15 WEEKS
MATERNAL AGE AT EDD: 29.1 a
MSAFP Mom: 1.16
MSAFP: 33.9 ng/mL
MSHCG Mom: 0.78
MSHCG: 39438 m[IU]/mL
Osb Risk: 10000
T18 (By Age): 1:3012 {titer}
TEST RESULTS AFP: NEGATIVE
UE3 VALUE: 0.4 ng/mL
Weight: 160 [lb_av]
uE3 Mom: 0.74

## 2016-12-29 ENCOUNTER — Encounter: Payer: Self-pay | Admitting: Obstetrics and Gynecology

## 2017-01-04 ENCOUNTER — Encounter: Payer: Self-pay | Admitting: Obstetrics & Gynecology

## 2017-01-19 ENCOUNTER — Inpatient Hospital Stay (HOSPITAL_COMMUNITY)
Admission: AD | Admit: 2017-01-19 | Discharge: 2017-01-19 | Discharge status: Against Medical Advice | Payer: Medicaid Other | Source: Ambulatory Visit | Attending: Obstetrics and Gynecology | Admitting: Obstetrics and Gynecology

## 2017-01-19 ENCOUNTER — Encounter (HOSPITAL_COMMUNITY): Payer: Self-pay | Admitting: *Deleted

## 2017-01-19 DIAGNOSIS — Z3A22 22 weeks gestation of pregnancy: Secondary | ICD-10-CM | POA: Diagnosis not present

## 2017-01-19 DIAGNOSIS — F17210 Nicotine dependence, cigarettes, uncomplicated: Secondary | ICD-10-CM | POA: Diagnosis not present

## 2017-01-19 DIAGNOSIS — O99332 Smoking (tobacco) complicating pregnancy, second trimester: Secondary | ICD-10-CM | POA: Diagnosis not present

## 2017-01-19 DIAGNOSIS — O99512 Diseases of the respiratory system complicating pregnancy, second trimester: Secondary | ICD-10-CM | POA: Diagnosis not present

## 2017-01-19 DIAGNOSIS — J45901 Unspecified asthma with (acute) exacerbation: Secondary | ICD-10-CM | POA: Insufficient documentation

## 2017-01-19 DIAGNOSIS — O26892 Other specified pregnancy related conditions, second trimester: Secondary | ICD-10-CM | POA: Diagnosis present

## 2017-01-19 DIAGNOSIS — R079 Chest pain, unspecified: Secondary | ICD-10-CM | POA: Diagnosis present

## 2017-01-19 LAB — URINALYSIS, ROUTINE W REFLEX MICROSCOPIC
BILIRUBIN URINE: NEGATIVE
GLUCOSE, UA: NEGATIVE mg/dL
HGB URINE DIPSTICK: NEGATIVE
KETONES UR: 5 mg/dL — AB
NITRITE: NEGATIVE
PH: 6 (ref 5.0–8.0)
Protein, ur: 30 mg/dL — AB
Specific Gravity, Urine: 1.025 (ref 1.005–1.030)

## 2017-01-19 LAB — RAPID URINE DRUG SCREEN, HOSP PERFORMED
Amphetamines: NOT DETECTED
BARBITURATES: NOT DETECTED
Benzodiazepines: NOT DETECTED
Cocaine: POSITIVE — AB
Opiates: NOT DETECTED
Tetrahydrocannabinol: POSITIVE — AB

## 2017-01-19 MED ORDER — ONDANSETRON 4 MG PO TBDP
4.0000 mg | ORAL_TABLET | Freq: Once | ORAL | Status: AC
  Administered 2017-01-19: 4 mg via ORAL
  Filled 2017-01-19: qty 1

## 2017-01-19 MED ORDER — ACETAMINOPHEN 500 MG PO TABS
1000.0000 mg | ORAL_TABLET | Freq: Once | ORAL | Status: AC
  Administered 2017-01-19: 1000 mg via ORAL
  Filled 2017-01-19: qty 2

## 2017-01-19 MED ORDER — DM-GUAIFENESIN ER 30-600 MG PO TB12
1.0000 | ORAL_TABLET | Freq: Once | ORAL | Status: AC
  Administered 2017-01-19: 1 via ORAL
  Filled 2017-01-19: qty 1

## 2017-01-19 MED ORDER — ALBUTEROL SULFATE (2.5 MG/3ML) 0.083% IN NEBU
2.5000 mg | INHALATION_SOLUTION | Freq: Once | RESPIRATORY_TRACT | Status: AC
  Administered 2017-01-19: 2.5 mg via RESPIRATORY_TRACT
  Filled 2017-01-19: qty 3

## 2017-01-19 NOTE — MAU Note (Signed)
Pt came to nurse's desk, stated she had to leave.  Explained to her MD needs to enter her discharge order & paperwork, pt says she just needs to go, her family is waiting.  Left without signing AMA form.
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
## 2017-01-23 ENCOUNTER — Encounter: Payer: Medicaid Other | Admitting: Obstetrics & Gynecology

## 2017-01-23 ENCOUNTER — Ambulatory Visit (HOSPITAL_COMMUNITY)
Admit: 2017-01-23 | Discharge: 2017-01-23 | Disposition: A | Discharge status: Home / Self Care | Payer: Medicaid Other | Attending: Obstetrics & Gynecology | Admitting: Obstetrics & Gynecology

## 2017-01-23 DIAGNOSIS — O34219 Maternal care for unspecified type scar from previous cesarean delivery: Secondary | ICD-10-CM | POA: Insufficient documentation

## 2017-01-23 DIAGNOSIS — Z3A22 22 weeks gestation of pregnancy: Secondary | ICD-10-CM | POA: Insufficient documentation

## 2017-01-23 DIAGNOSIS — Z363 Encounter for antenatal screening for malformations: Secondary | ICD-10-CM | POA: Diagnosis not present

## 2017-01-23 DIAGNOSIS — Z3689 Encounter for other specified antenatal screening: Secondary | ICD-10-CM | POA: Diagnosis not present

## 2017-01-23 DIAGNOSIS — O99322 Drug use complicating pregnancy, second trimester: Secondary | ICD-10-CM | POA: Diagnosis not present

## 2017-01-23 DIAGNOSIS — O0932 Supervision of pregnancy with insufficient antenatal care, second trimester: Secondary | ICD-10-CM | POA: Diagnosis not present

## 2017-01-25 ENCOUNTER — Inpatient Hospital Stay (HOSPITAL_COMMUNITY): Payer: Medicaid Other

## 2017-01-25 ENCOUNTER — Encounter (HOSPITAL_COMMUNITY): Payer: Self-pay | Admitting: *Deleted

## 2017-01-25 ENCOUNTER — Inpatient Hospital Stay (HOSPITAL_COMMUNITY)
Admission: AD | Admit: 2017-01-25 | Discharge: 2017-01-27 | DRG: 781 | Discharge status: Against Medical Advice | Payer: Medicaid Other | Source: Ambulatory Visit | Attending: Family Medicine | Admitting: Family Medicine

## 2017-01-25 DIAGNOSIS — O0932 Supervision of pregnancy with insufficient antenatal care, second trimester: Secondary | ICD-10-CM | POA: Diagnosis not present

## 2017-01-25 DIAGNOSIS — R0602 Shortness of breath: Secondary | ICD-10-CM

## 2017-01-25 DIAGNOSIS — Z3A23 23 weeks gestation of pregnancy: Secondary | ICD-10-CM

## 2017-01-25 DIAGNOSIS — J45901 Unspecified asthma with (acute) exacerbation: Secondary | ICD-10-CM | POA: Diagnosis present

## 2017-01-25 DIAGNOSIS — O99512 Diseases of the respiratory system complicating pregnancy, second trimester: Secondary | ICD-10-CM | POA: Diagnosis present

## 2017-01-25 DIAGNOSIS — F1210 Cannabis abuse, uncomplicated: Secondary | ICD-10-CM | POA: Diagnosis present

## 2017-01-25 DIAGNOSIS — F17210 Nicotine dependence, cigarettes, uncomplicated: Secondary | ICD-10-CM | POA: Diagnosis present

## 2017-01-25 DIAGNOSIS — O34211 Maternal care for low transverse scar from previous cesarean delivery: Secondary | ICD-10-CM | POA: Diagnosis present

## 2017-01-25 DIAGNOSIS — O99332 Smoking (tobacco) complicating pregnancy, second trimester: Secondary | ICD-10-CM | POA: Diagnosis present

## 2017-01-25 DIAGNOSIS — O99322 Drug use complicating pregnancy, second trimester: Secondary | ICD-10-CM | POA: Diagnosis present

## 2017-01-25 DIAGNOSIS — F1920 Other psychoactive substance dependence, uncomplicated: Secondary | ICD-10-CM | POA: Diagnosis present

## 2017-01-25 DIAGNOSIS — J181 Lobar pneumonia, unspecified organism: Secondary | ICD-10-CM

## 2017-01-25 DIAGNOSIS — F1410 Cocaine abuse, uncomplicated: Secondary | ICD-10-CM | POA: Diagnosis present

## 2017-01-25 DIAGNOSIS — J189 Pneumonia, unspecified organism: Secondary | ICD-10-CM | POA: Diagnosis present

## 2017-01-25 DIAGNOSIS — R109 Unspecified abdominal pain: Secondary | ICD-10-CM

## 2017-01-25 DIAGNOSIS — O26899 Other specified pregnancy related conditions, unspecified trimester: Secondary | ICD-10-CM

## 2017-01-25 LAB — CBC WITH DIFFERENTIAL/PLATELET
Basophils Absolute: 0 10*3/uL (ref 0.0–0.1)
Basophils Relative: 0 %
EOS ABS: 0 10*3/uL (ref 0.0–0.7)
EOS PCT: 0 %
HCT: 30.7 % — ABNORMAL LOW (ref 36.0–46.0)
Hemoglobin: 10.5 g/dL — ABNORMAL LOW (ref 12.0–15.0)
LYMPHS ABS: 1.8 10*3/uL (ref 0.7–4.0)
LYMPHS PCT: 14 %
MCH: 32.2 pg (ref 26.0–34.0)
MCHC: 34.2 g/dL (ref 30.0–36.0)
MCV: 94.2 fL (ref 78.0–100.0)
MONO ABS: 0.4 10*3/uL (ref 0.1–1.0)
Monocytes Relative: 3 %
Neutro Abs: 10.1 10*3/uL — ABNORMAL HIGH (ref 1.7–7.7)
Neutrophils Relative %: 83 %
PLATELETS: 253 10*3/uL (ref 150–400)
RBC: 3.26 MIL/uL — ABNORMAL LOW (ref 3.87–5.11)
RDW: 12.8 % (ref 11.5–15.5)
WBC: 12.3 10*3/uL — AB (ref 4.0–10.5)

## 2017-01-25 LAB — TYPE AND SCREEN
ABO/RH(D): B POS
Antibody Screen: NEGATIVE

## 2017-01-25 MED ORDER — LACTATED RINGERS IV BOLUS (SEPSIS): 1000.0000 mL | Freq: Once | INTRAVENOUS | Status: DC

## 2017-01-25 MED ORDER — ALBUTEROL SULFATE (2.5 MG/3ML) 0.083% IN NEBU
INHALATION_SOLUTION | RESPIRATORY_TRACT | Status: AC
  Filled 2017-01-25: qty 3

## 2017-01-25 MED ORDER — DEXTROSE 5 % IV SOLN
2.0000 g | INTRAVENOUS | Status: DC
  Administered 2017-01-25 – 2017-01-26 (×2): 2 g via INTRAVENOUS
  Filled 2017-01-25 (×2): qty 2

## 2017-01-25 MED ORDER — ACETAMINOPHEN 325 MG PO TABS: 650.0000 mg | ORAL_TABLET | ORAL | Status: DC | PRN

## 2017-01-25 MED ORDER — IPRATROPIUM-ALBUTEROL 0.5-2.5 (3) MG/3ML IN SOLN
RESPIRATORY_TRACT | Status: AC
  Administered 2017-01-25: 3 mL
  Filled 2017-01-25: qty 3

## 2017-01-25 MED ORDER — IPRATROPIUM-ALBUTEROL 0.5-2.5 (3) MG/3ML IN SOLN
3.0000 mL | Freq: Four times a day (QID) | RESPIRATORY_TRACT | Status: DC
  Administered 2017-01-25 – 2017-01-27 (×7): 3 mL via RESPIRATORY_TRACT
  Filled 2017-01-25 (×8): qty 3

## 2017-01-25 MED ORDER — KETOROLAC TROMETHAMINE 30 MG/ML IJ SOLN
30.0000 mg | Freq: Once | INTRAMUSCULAR | Status: AC
  Administered 2017-01-25: 30 mg via INTRAVENOUS
  Filled 2017-01-25: qty 1

## 2017-01-25 MED ORDER — SODIUM CHLORIDE 0.9% FLUSH: 10.0000 mL | Freq: Two times a day (BID) | INTRAVENOUS | Status: DC

## 2017-01-25 MED ORDER — SODIUM CHLORIDE 0.9% FLUSH: 10.0000 mL | INTRAVENOUS | Status: DC | PRN

## 2017-01-25 MED ORDER — SODIUM CHLORIDE 0.9 % IV SOLN
INTRAVENOUS | Status: DC
  Administered 2017-01-25 – 2017-01-27 (×5): via INTRAVENOUS

## 2017-01-25 MED ORDER — CALCIUM CARBONATE ANTACID 500 MG PO CHEW: 2.0000 | CHEWABLE_TABLET | ORAL | Status: DC | PRN

## 2017-01-25 MED ORDER — HYDROCODONE-ACETAMINOPHEN 5-325 MG PO TABS
1.0000 | ORAL_TABLET | Freq: Four times a day (QID) | ORAL | Status: DC | PRN
  Administered 2017-01-25 – 2017-01-27 (×5): 2 via ORAL
  Filled 2017-01-25 (×5): qty 2

## 2017-01-25 MED ORDER — DOCUSATE SODIUM 100 MG PO CAPS
100.0000 mg | ORAL_CAPSULE | Freq: Every day | ORAL | Status: DC
  Administered 2017-01-26 – 2017-01-27 (×2): 100 mg via ORAL
  Filled 2017-01-25 (×4): qty 1

## 2017-01-25 MED ORDER — ZOLPIDEM TARTRATE 5 MG PO TABS: 5.0000 mg | ORAL_TABLET | Freq: Every evening | ORAL | Status: DC | PRN

## 2017-01-25 MED ORDER — PRENATAL MULTIVITAMIN CH
1.0000 | ORAL_TABLET | Freq: Every day | ORAL | Status: DC
  Administered 2017-01-26: 1 via ORAL
  Filled 2017-01-25 (×3): qty 1

## 2017-01-25 NOTE — MAU Note (Signed)
Pt arrives via EMS with complaint of shortness of breath, pain in chest with coughing and deep breaths. Reports she was diagnosed with asthma 2 days ago and it has gotten worse. Reports productive cough that is sometimes yellow and sometimes clear. Denies fever but reports chills.
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
## 2017-01-25 NOTE — Progress Notes (Signed)
Peripherally Inserted Central Catheter/Midline Placement  The IV Nurse has discussed with the patient and/or persons authorized to consent for the patient, the purpose of this procedure and the potential benefits and risks involved with this procedure.  The benefits include less needle sticks, lab draws from the catheter, and the patient may be discharged home with the catheter. Risks include, but not limited to, infection, bleeding, blood clot (thrombus formation), and puncture of an artery; nerve damage and irregular heartbeat and possibility to perform a PICC exchange if needed/ordered by physician.  Alternatives to this procedure were also discussed.  Bard Power PICC patient education guide, fact sheet on infection prevention and patient information card has been provided to patient /or left at bedside.    PICC/Midline Placement Documentation        Timmothy Soursewman, Justyn Langham Renee 01/25/2017, 6:08 PM
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
## 2017-01-26 ENCOUNTER — Inpatient Hospital Stay (HOSPITAL_COMMUNITY): Payer: Medicaid Other

## 2017-01-26 ENCOUNTER — Encounter: Payer: Self-pay | Admitting: Obstetrics and Gynecology

## 2017-01-26 ENCOUNTER — Encounter (HOSPITAL_COMMUNITY): Payer: Self-pay | Admitting: Radiology

## 2017-01-26 DIAGNOSIS — J189 Pneumonia, unspecified organism: Secondary | ICD-10-CM

## 2017-01-26 DIAGNOSIS — O99512 Diseases of the respiratory system complicating pregnancy, second trimester: Principal | ICD-10-CM

## 2017-01-26 LAB — RAPID URINE DRUG SCREEN, HOSP PERFORMED
AMPHETAMINES: NOT DETECTED
BARBITURATES: NOT DETECTED
Benzodiazepines: NOT DETECTED
Cocaine: POSITIVE — AB
Opiates: POSITIVE — AB
Tetrahydrocannabinol: POSITIVE — AB

## 2017-01-26 LAB — RUBELLA SCREEN: Rubella: 1.43 index (ref >0.99)

## 2017-01-26 LAB — RPR: RPR Ser Ql: NONREACTIVE

## 2017-01-26 LAB — CBC WITH DIFFERENTIAL/PLATELET
BASOS ABS: 0 10*3/uL (ref 0.0–0.1)
Basophils Relative: 0 %
EOS ABS: 0.1 10*3/uL (ref 0.0–0.7)
Eosinophils Relative: 1 %
HCT: 29.7 % — ABNORMAL LOW (ref 36.0–46.0)
HEMOGLOBIN: 10.1 g/dL — AB (ref 12.0–15.0)
LYMPHS ABS: 2.1 10*3/uL (ref 0.7–4.0)
LYMPHS PCT: 25 %
MCH: 32.4 pg (ref 26.0–34.0)
MCHC: 34 g/dL (ref 30.0–36.0)
MCV: 95.2 fL (ref 78.0–100.0)
Monocytes Absolute: 0.3 10*3/uL (ref 0.1–1.0)
Monocytes Relative: 4 %
NEUTROS PCT: 70 %
Neutro Abs: 5.9 10*3/uL (ref 1.7–7.7)
PLATELETS: 254 10*3/uL (ref 150–400)
RBC: 3.12 MIL/uL — AB (ref 3.87–5.11)
RDW: 13.3 % (ref 11.5–15.5)
WBC: 8.4 10*3/uL (ref 4.0–10.5)

## 2017-01-26 LAB — HIV ANTIBODY (ROUTINE TESTING W REFLEX): HIV SCREEN 4TH GENERATION: NONREACTIVE

## 2017-01-26 MED ORDER — IOPAMIDOL (ISOVUE-370) INJECTION 76%
100.0000 mL | Freq: Once | INTRAVENOUS | Status: AC | PRN
  Administered 2017-01-26: 100 mL via INTRAVENOUS

## 2017-01-26 MED ORDER — AZITHROMYCIN 250 MG PO TABS
500.0000 mg | ORAL_TABLET | Freq: Every day | ORAL | Status: AC
  Administered 2017-01-26: 500 mg via ORAL
  Filled 2017-01-26: qty 2

## 2017-01-26 MED ORDER — AZITHROMYCIN 250 MG PO TABS
250.0000 mg | ORAL_TABLET | Freq: Every day | ORAL | Status: DC
  Administered 2017-01-27: 250 mg via ORAL
  Filled 2017-01-26: qty 1

## 2017-01-26 NOTE — Progress Notes (Signed)
Patient insisting that peripheral IV be removed now that CT is completed. The possibility of needing another IV, which would require reinsertion, and other reasons for leaving IV were discussed, but patient still requested it be removed. NT removed IV.

## 2017-01-26 NOTE — Progress Notes (Signed)
OB Note Patient in u/s. Will come back by later to see if I can convince pt to get PIV for contrast for CT PE rule out  Monica Holloway, Jr MD Attending Center for [facility] (Faculty Practice) 01/26/2017 Time: 1207
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
## 2017-01-26 NOTE — Progress Notes (Signed)
OB Note  D/w pt re: negative PE on CT scan and may consult IM or pulm if lungs s/s not improving. Azithromycin added today  Monica Holloway, Jr MD Attending Center for [facility] (Faculty Practice) 01/26/2017 Time: [phone]

## 2017-01-27 ENCOUNTER — Telehealth: Payer: Self-pay | Admitting: Obstetrics & Gynecology

## 2017-01-27 LAB — HCV COMMENT:

## 2017-01-27 LAB — CBC WITH DIFFERENTIAL/PLATELET
Basophils Absolute: 0 10*3/uL (ref 0.0–0.1)
Basophils Relative: 0 %
EOS ABS: 0.1 10*3/uL (ref 0.0–0.7)
Eosinophils Relative: 2 %
HCT: 30.2 % — ABNORMAL LOW (ref 36.0–46.0)
HEMOGLOBIN: 10.3 g/dL — AB (ref 12.0–15.0)
LYMPHS ABS: 2.1 10*3/uL (ref 0.7–4.0)
Lymphocytes Relative: 31 %
MCH: 32.5 pg (ref 26.0–34.0)
MCHC: 34.1 g/dL (ref 30.0–36.0)
MCV: 95.3 fL (ref 78.0–100.0)
MONOS PCT: 2 %
Monocytes Absolute: 0.2 10*3/uL (ref 0.1–1.0)
NEUTROS PCT: 65 %
Neutro Abs: 4.4 10*3/uL (ref 1.7–7.7)
Platelets: 248 10*3/uL (ref 150–400)
RBC: 3.17 MIL/uL — ABNORMAL LOW (ref 3.87–5.11)
RDW: 13.3 % (ref 11.5–15.5)
WBC: 6.7 10*3/uL (ref 4.0–10.5)

## 2017-01-27 LAB — HEPATITIS B SURFACE ANTIGEN: HEP B S AG: NEGATIVE

## 2017-01-27 LAB — HEPATITIS C ANTIBODY (REFLEX): HCV Ab: <0.1 s/co ratio (ref 0.0–0.9)

## 2017-01-27 MED ORDER — AZITHROMYCIN 500 MG PO TABS: 500.0000 mg | ORAL_TABLET | Freq: Every day | ORAL | 0 refills | Status: DC

## 2017-01-27 MED ORDER — CEFUROXIME AXETIL 500 MG PO TABS: 500.0000 mg | ORAL_TABLET | Freq: Two times a day (BID) | ORAL | 0 refills | Status: DC

## 2017-01-27 NOTE — Progress Notes (Signed)
CSW attempted to meet with patient to complete psychosocial assessment as she tested positive for opiates, cocaine and marijuana.  CSW was informed that patient has just left AMA.

## 2017-01-27 NOTE — Progress Notes (Signed)
Discussed patient with Dr. Ilsa IhaSnyder (ID). Patient has been afebrile since admission on 01/25/17; currently on Rocephin and Azithromycin.  As per her recommendation, will discharge her on Azithromycin 500 mg po qd and  Cefuroxime 500 mg po bid x 7 days.  PICC line will be removed, patient will be discharged to home today.  She will follow up in clinic as scheduled; they will call her with appointment time and date.  Monica Holloway  [name], MD, FACOG Attending Obstetrician & Gynecologist, [facility] Center for [facility], [facility]

## 2017-01-27 NOTE — Telephone Encounter (Signed)
Faculty Practice OB/GYN Attending Phone Call Documentation   I called patient and was able to get her to inform her to pick up prescribed medications (Cefuroxime and Azithromycin). She was mad and said "You should have given them to me; I am going to a different hospital now". She was told to pick up her medications and take as instructed; she also was reminded she did not given us a chance to get her the medications as she left abruptly. Risks of worsening infection, sepsis, fetal injury were emphasized and patient hung up on me.   Jaynie CollinsUGONNA  Alexismarie Flaim, MD, FACOG Attending Obstetrician & Gynecologist, [facility], [facility]

## 2017-01-27 NOTE — Progress Notes (Signed)
I was informed that patient was very upset and left AMA when she was informed she will be discharged.  I was unable to give her medications as recommended.  Medications prescribed, left message for her to pick up and take as directed.   Jaynie CollinsUGONNA  ANYANWU, MD, FACOG Attending Obstetrician & Gynecologist, [facility], [facility]

## 2017-01-27 NOTE — Discharge Summary (Signed)
Physician Discharge Summary  Patient ID: RAYGEN LINQUIST MRN: [id number] DOB/AGE: 11/01/1985 31 y.o.  Admit date: 01/25/2017 Discharge date: 01/27/2017  Admission Diagnoses:  Discharge Diagnoses:  Principal Problem:   Pneumonia affecting pregnancy in second trimester Active Problems:   Polysubstance (excluding opioids) dependence with physiological dependence [facility])  Discharged Condition: stable  Hospital Course: Patient was admitted with pneumonia at [redacted]w[redacted]d and treated with Rocephin.  Azithromycin was also added. No fevers throughout her stay. PICC line was placed for medication administration due to patient's preference.  SW was consulted for patient given her substance abuse. On 8/3, she it was felt she was stable for outpatient management.  Dr. Ilsa Iha (ID) was consulted, she recommended Cefuroxime and Azithromycin. When patient was told she will go home, she became upset and left before discharge instructions could be reviewed with her but after PICC line removal.  SW was unable to see her because she left abruptly. Patient had limited prenatal care, appointment was scheduled for her in the [facility] office.  Consults: ID and Social Work  Significant Diagnostic Studies: Results for orders placed or performed during the hospital encounter of 01/25/17 (from the past 168 hour(s))  CBC with Differential/Platelet   Collection Time: 01/25/17  6:00 PM  Result Value Ref Range   WBC 12.3 (H) 4.0 - 10.5 K/uL   RBC 3.26 (L) 3.87 - 5.11 MIL/uL   Hemoglobin 10.5 (L) 12.0 - 15.0 g/dL   HCT 84.6 (L) 96.2 - 95.2 %   MCV 94.2 78.0 - 100.0 fL   MCH 32.2 26.0 - 34.0 pg   MCHC 34.2 30.0 - 36.0 g/dL   RDW 84.1 32.4 - 40.1 %   Platelets 253 150 - 400 K/uL   Neutrophils Relative % 83 %   Neutro Abs 10.1 (H) 1.7 - 7.7 K/uL   Lymphocytes Relative 14 %   Lymphs Abs 1.8 0.7 - 4.0 K/uL   Monocytes Relative 3 %   Monocytes Absolute 0.4 0.1 - 1.0 K/uL   Eosinophils Relative 0 %   Eosinophils Absolute 0.0  0.0 - 0.7 K/uL   Basophils Relative 0 %   Basophils Absolute 0.0 0.0 - 0.1 K/uL  Hepatitis B surface antigen   Collection Time: 01/25/17  6:00 PM  Result Value Ref Range   Hepatitis B Surface Ag Negative Negative  Rubella screen   Collection Time: 01/25/17  6:00 PM  Result Value Ref Range   Rubella 1.43 Immune >0.99 index  RPR   Collection Time: 01/25/17  6:00 PM  Result Value Ref Range   RPR Ser Ql Non Reactive Non Reactive  HIV antibody (routine testing)   Collection Time: 01/25/17  6:00 PM  Result Value Ref Range   HIV Screen 4th Generation wRfx Non Reactive Non Reactive  Type and screen   Collection Time: 01/25/17  6:00 PM  Result Value Ref Range   ABO/RH(D) B POS    Antibody Screen NEG    Sample Expiration 01/28/2017   Rapid urine drug screen (hospital performed)   Collection Time: 01/26/17  3:45 AM  Result Value Ref Range   Opiates POSITIVE (A) NONE DETECTED   Cocaine POSITIVE (A) NONE DETECTED   Benzodiazepines NONE DETECTED NONE DETECTED   Amphetamines NONE DETECTED NONE DETECTED   Tetrahydrocannabinol POSITIVE (A) NONE DETECTED   Barbiturates NONE DETECTED NONE DETECTED  CBC with Differential/Platelet   Collection Time: 01/26/17 10:30 AM  Result Value Ref Range   WBC 8.4 4.0 - 10.5 K/uL   RBC 3.12 (L)  3.87 - 5.11 MIL/uL   Hemoglobin 10.1 (L) 12.0 - 15.0 g/dL   HCT 40.9 (L) 81.1 - 91.4 %   MCV 95.2 78.0 - 100.0 fL   MCH 32.4 26.0 - 34.0 pg   MCHC 34.0 30.0 - 36.0 g/dL   RDW 78.2 95.6 - 21.3 %   Platelets 254 150 - 400 K/uL   Neutrophils Relative % 70 %   Neutro Abs 5.9 1.7 - 7.7 K/uL   Lymphocytes Relative 25 %   Lymphs Abs 2.1 0.7 - 4.0 K/uL   Monocytes Relative 4 %   Monocytes Absolute 0.3 0.1 - 1.0 K/uL   Eosinophils Relative 1 %   Eosinophils Absolute 0.1 0.0 - 0.7 K/uL   Basophils Relative 0 %   Basophils Absolute 0.0 0.0 - 0.1 K/uL  Hepatitis c antibody (reflex)   Collection Time: 01/26/17 10:30 AM  Result Value Ref Range   HCV Ab <0.1 0.0 -  0.9 s/co ratio  HCV Comment:   Collection Time: 01/26/17 10:30 AM  Result Value Ref Range   Comment: Comment   CBC with Differential/Platelet   Collection Time: 01/27/17  9:00 AM  Result Value Ref Range   WBC 6.7 4.0 - 10.5 K/uL   RBC 3.17 (L) 3.87 - 5.11 MIL/uL   Hemoglobin 10.3 (L) 12.0 - 15.0 g/dL   HCT 08.6 (L) 57.8 - 46.9 %   MCV 95.3 78.0 - 100.0 fL   MCH 32.5 26.0 - 34.0 pg   MCHC 34.1 30.0 - 36.0 g/dL   RDW 62.9 52.8 - 41.3 %   Platelets 248 150 - 400 K/uL   Neutrophils Relative % 65 %   Neutro Abs 4.4 1.7 - 7.7 K/uL   Lymphocytes Relative 31 %   Lymphs Abs 2.1 0.7 - 4.0 K/uL   Monocytes Relative 2 %   Monocytes Absolute 0.2 0.1 - 1.0 K/uL   Eosinophils Relative 2 %   Eosinophils Absolute 0.1 0.0 - 0.7 K/uL   Basophils Relative 0 %   Basophils Absolute 0.0 0.0 - 0.1 K/uL    Dg Chest 2 View  Result Date: 01/25/2017 CLINICAL DATA:  Patient with nonproductive cough. EXAM: CHEST  2 VIEW COMPARISON:  Chest radiograph 11/11/2015. FINDINGS: Cardiac contours upper limits of normal. Patchy consolidation within the right middle lobe. Probable left basilar atelectasis. No pleural effusion or pneumothorax. Regional skeleton is unremarkable. IMPRESSION: Right middle lobe consolidation concerning for pneumonia in the appropriate clinical setting. Electronically Signed   By: Annia Belt M.D.   On: 01/25/2017 12:13   Ct Angio Chest Pe W Or Wo Contrast  Result Date: 01/26/2017 CLINICAL DATA:  Cough and shortness of breath.  Chest pain. EXAM: CT ANGIOGRAPHY CHEST WITH CONTRAST TECHNIQUE: Multidetector CT imaging of the chest was performed using the standard protocol during bolus administration of intravenous contrast. Multiplanar CT image reconstructions and MIPs were obtained to evaluate the vascular anatomy. CONTRAST:  100 mL Isovue 370 nonionic COMPARISON:  Chest radiograph January 25, 2017 FINDINGS: Cardiovascular: There is no demonstrable pulmonary embolus. There is no thoracic aortic  aneurysm or dissection. Visualized great vessels appear unremarkable. The right and left common carotid arteries arise as a common trunk, an anatomic variant. There is no appreciable thoracic aortic calcification. Pericardium is not thickened. Mediastinum/Nodes: Visualized thyroid appears unremarkable. There is no appreciable thoracic adenopathy. There is a small hiatal hernia. Lungs/Pleura: There is consolidation throughout much of the right middle lobe, primarily in the lateral segment. There is patchy airspace consolidation in  the inferior lingula as well as in the both lower lobes posteriorly. Upper lobes bilaterally are clear. No appreciable pleural effusion or pleural thickening. Upper Abdomen: Visualized upper abdominal structures appear unremarkable. Musculoskeletal: There are no blastic or lytic bone lesions. Review of the MIP images confirms the above findings. IMPRESSION: 1.  No demonstrable pulmonary embolus. 2. Foci of airspace consolidation consistent with pneumonia in the right middle lobe, inferior lingula, and both lower lobes posteriorly with the greatest degree of consolidation in the lateral segment of the right middle lobe. 3.  Small hiatal hernia. 4.  No evident adenopathy. Electronically Signed   By: Bretta BangWilliam  Woodruff III M.D.   On: 01/26/2017 12:35   Dg Chest Port 1 View  Result Date: 01/25/2017 CLINICAL DATA:  Central line placement EXAM: PORTABLE CHEST 1 VIEW COMPARISON:  01/25/2017 FINDINGS: Airspace opacity in both lower lungs, right greater than left, increasing since prior study concerning for pneumonia. Heart is normal size. Right PICC line tip is at the cavoatrial junction. IMPRESSION: Right PICC line tip at the cavoatrial junction. Increasing bibasilar opacities concerning for pneumonia. Electronically Signed   By: Charlett NoseKevin  Dover M.D.   On: 01/25/2017 18:44   [place] Mfm Ob Detail +14 Wk  Result Date: 01/23/2017 ----------------------------------------------------------------------   OBSTETRICS REPORT                      (Signed Final 01/23/2017 10:34 am) ---------------------------------------------------------------------- Patient Info  ID #:       [id number]                         D.O.B.:   Feb 03, 1986 (30 yrs)  Name:       Peggye LeyBRITTANY L Mutz              Visit Date:  01/23/2017 09:17 am ---------------------------------------------------------------------- Performed By  Performed By:     Percell BostonHeather Waken          Ref. Address:     [address]                    [address]                                                             [address]                                                             [id number]                                                             [address]  Attending:        Candis ShinePaul W Whitecar        [facility]  MD  Referred By:      Claudia Desanctis POE                    CNM ---------------------------------------------------------------------- Orders   #  Description                                 Code   1  [place] MFM OB DETAIL +14 WK                     76811.01  ----------------------------------------------------------------------   #  Ordered By               Order #        Accession #    Episode #   1  Elsie Lincoln            [id number]      [id number]     [id number]  ---------------------------------------------------------------------- Indications   [redacted] weeks gestation of pregnancy                Z3A.22   Encounter for antenatal screening for          Z36.3   malformations   Drug use complicating pregnancy, second        O99.322   trimester (+ cocaine and thc 01/19/17)   Previous cesarean delivery, antepartum         O34.219   Insufficient Prenatal Care                     O09.30  ---------------------------------------------------------------------- OB History  Blood Type:            Height:  5'2"   Weight (lb):  160      BMI:   29.26  Gravidity:    3         Term:   1        Prem:   0        SAB:   1  TOP:          0       Ectopic:  0         Living: 1 ---------------------------------------------------------------------- Fetal Evaluation  Num Of Fetuses:     1  Fetal Heart         136  Rate(bpm):  Cardiac Activity:   Observed  Presentation:       Breech  Placenta:           Posterior, above cervical os  P. Cord Insertion:  Visualized, central  Amniotic Fluid  AFI FV:      Subjectively within normal limits                              Largest Pocket(cm)                              5.0 ---------------------------------------------------------------------- Biometry  BPD:      49.7  mm     G. Age:  21w 0d          2  %    CI:        72.13   %   70 - 86  FL/HC:      21.2   %   19.2 - 20.8  HC:      186.2  mm     G. Age:  20w 6d        < 3  %    HC/AC:      1.08       1.05 - 1.21  AC:      173.2  mm     G. Age:  22w 2d         24  %    FL/BPD:     79.3   %   71 - 87  FL:       39.4  mm     G. Age:  22w 5d         34  %    FL/AC:      22.7   %   20 - 24  HUM:      37.4  mm     G. Age:  23w 1d         48  %  Est. FW:     487  gm      1 lb 1 oz     37  % ---------------------------------------------------------------------- Gestational Age  U/S Today:     21w 5d                                        EDD:   05/31/17  Best:          22w 6d    Det. ByMarcella Dubs         EDD:   05/23/17                                      (10/17/16) ---------------------------------------------------------------------- Anatomy  Cranium:               Appears normal         Aortic Arch:            Not well visualized  Cavum:                 Appears normal         Ductal Arch:            Appears normal  Ventricles:            Appears normal         Diaphragm:              Appears normal  Choroid Plexus:        Appears normal         Stomach:                Appears normal, left                                                                        sided  Cerebellum:            Appears normal  Abdomen:                 Appears normal  Posterior Fossa:       Appears normal         Abdominal Wall:         Appears nml (cord                                                                        insert, abd wall)  Nuchal Fold:           Not applicable (>20    Cord Vessels:           Appears normal ([redacted]                         wks GA)                                        vessel cord)  Face:                  Appears normal         Kidneys:                Appear normal                         (orbits and profile)  Lips:                  Appears normal         Bladder:                Appears normal  Thoracic:              Appears normal         Spine:                  Not well visualized  Heart:                 Appears normal         Upper Extremities:      Appears normal                         (4CH, axis, and situs  RVOT:                  Not well visualized    Lower Extremities:      Appears normal  LVOT:                  Not well visualized  Other:  Technically difficult due to fetal position. Heels visualized. Technically          difficult due to maternal habitus and fetal position. Fetus appears to          be a female. ---------------------------------------------------------------------- Cervix Uterus Adnexa  Cervix  Length:            3.5  cm.  Normal appearance by transabdominal scan.  Uterus  No abnormality visualized.  Left Ovary  No adnexal mass visualized.  Within normal limits.  Right Ovary  No adnexal mass visualized. Within normal limits.  Cul De Sac:   No free fluid seen.  Adnexa:       No abnormality visualized. ---------------------------------------------------------------------- Impression  Single IUP at 22w 6d  Limited views of the fetal heart and spine obtained  The remainder of the fetal anatomy appears normal  Posterior placenta without previa  The estimated fetal weight is at the 37th %tile  Normal amniotic fluid volume ---------------------------------------------------------------------- Recommendations   Recommend follow-up ultrasound examination in 4 weeks to  reevaluate the fetal heart and spine ----------------------------------------------------------------------                Candis Shine, MD Electronically Signed Final Report   01/23/2017 10:34 am ----------------------------------------------------------------------   Discharge Exam: Blood pressure 119/65, pulse 90, temperature 98.4 F (36.9 C), temperature source Oral, resp. rate 18, height 5\' 3"  (1.6 m), weight 165 lb (74.8 kg), last menstrual period 05/11/2016, SpO2 98 %, unknown if currently breastfeeding. General appearance: alert and no distress Resp: no respiration issues, normal effort. Unable to do lung auscultation Cardio: normal heart rate Pelvic: deferred Extremities: extremities normal, atraumatic, no cyanosis or edema Neurologic: Grossly normal, agitated  Disposition: 07-Left Against Medical Advice/Left Without Being Seen/Elopement   Allergies as of 01/27/2017   No Known Allergies     Medication List    TAKE these medications   azithromycin 500 MG tablet Commonly known as:  ZITHROMAX Take 1 tablet (500 mg total) by mouth daily. For seven days   cefUROXime 500 MG tablet Commonly known as:  CEFTIN Take 1 tablet (500 mg total) by mouth 2 (two) times daily with a meal. For five days   ondansetron 4 MG disintegrating tablet Commonly known as:  ZOFRAN ODT Take 1 tablet (4 mg total) by mouth every 8 (eight) hours as needed for nausea or vomiting.   prenatal multivitamin Tabs tablet Take 1 tablet by mouth daily at 12 noon.        SignedJaynie Collins 01/27/2017, 2:32 PM

## 2017-01-27 NOTE — Discharge Summary (Signed)
Pt left floor AMA directly after PICC line removal, despite directions to lie flat/rest and allow  [facility]ass[facility]. Pt refused education or to wait for SW consult nor antibiotic orders. Pt left floor highly agitated asking for a bus pass. Pt was informed that Case Management would handle that, patient refused to wait. Pt returned to the floor within minutes returning to her room to make a series of calls, was verbally agitated with staff before leaving the floor for a final time. MD aware of incident.

## 2017-01-30 ENCOUNTER — Encounter: Payer: Self-pay | Admitting: Obstetrics and Gynecology

## 2017-01-30 NOTE — Progress Notes (Signed)
This encounter was created in error - please disregard.

## 2017-02-21 ENCOUNTER — Emergency Department (HOSPITAL_COMMUNITY)
Admission: EM | Admit: 2017-02-21 | Discharge: 2017-02-21 | Discharge status: Against Medical Advice | Payer: Medicaid Other | Attending: Emergency Medicine | Admitting: Emergency Medicine

## 2017-02-21 ENCOUNTER — Encounter (HOSPITAL_COMMUNITY): Payer: Self-pay

## 2017-02-21 ENCOUNTER — Emergency Department (HOSPITAL_COMMUNITY): Payer: Medicaid Other

## 2017-02-21 DIAGNOSIS — J45901 Unspecified asthma with (acute) exacerbation: Secondary | ICD-10-CM | POA: Insufficient documentation

## 2017-02-21 DIAGNOSIS — Z87891 Personal history of nicotine dependence: Secondary | ICD-10-CM | POA: Diagnosis not present

## 2017-02-21 DIAGNOSIS — R072 Precordial pain: Secondary | ICD-10-CM

## 2017-02-21 DIAGNOSIS — R0602 Shortness of breath: Secondary | ICD-10-CM | POA: Diagnosis present

## 2017-02-21 MED ORDER — SODIUM CHLORIDE 0.9 % IV BOLUS (SEPSIS)
500.0000 mL | Freq: Once | INTRAVENOUS | Status: AC
  Administered 2017-02-21: 500 mL via INTRAVENOUS

## 2017-02-21 MED ORDER — IPRATROPIUM-ALBUTEROL 0.5-2.5 (3) MG/3ML IN SOLN
3.0000 mL | Freq: Once | RESPIRATORY_TRACT | Status: AC
  Administered 2017-02-21: 3 mL via RESPIRATORY_TRACT
  Filled 2017-02-21: qty 3

## 2017-02-21 MED ORDER — ALBUTEROL SULFATE (2.5 MG/3ML) 0.083% IN NEBU
5.0000 mg | INHALATION_SOLUTION | Freq: Once | RESPIRATORY_TRACT | Status: AC
  Administered 2017-02-21: 5 mg via RESPIRATORY_TRACT
  Filled 2017-02-21: qty 6

## 2017-02-21 MED ORDER — IPRATROPIUM BROMIDE 0.02 % IN SOLN
0.5000 mg | Freq: Once | RESPIRATORY_TRACT | Status: AC
  Administered 2017-02-21: 0.5 mg via RESPIRATORY_TRACT
  Filled 2017-02-21: qty 2.5

## 2017-02-21 MED ORDER — ALBUTEROL SULFATE HFA 108 (90 BASE) MCG/ACT IN AERS: 2.0000 | INHALATION_SPRAY | Freq: Once | RESPIRATORY_TRACT | Status: DC

## 2017-02-21 MED ORDER — PREDNISONE 10 MG PO TABS: 40.0000 mg | ORAL_TABLET | Freq: Every day | ORAL | 0 refills | Status: DC

## 2017-02-21 NOTE — ED Notes (Signed)
Pt is alert and oriented x 4 and is verbally responsive. OB/GYN RN is in room with pt applying fetal monitor

## 2017-02-21 NOTE — ED Notes (Addendum)
Pt informed EDP that she is leaving AMA. Pt was seen ambulating out of the unit in no distress. Pt refused to sign
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
## 2017-02-21 NOTE — ED Notes (Signed)
Pt O2 saturation decreased from 88-94 RA placed pt on 2 lpm of oxygen.

## 2017-02-21 NOTE — ED Notes (Signed)
Patient transported to X-ray 

## 2017-02-21 NOTE — ED Notes (Signed)
Bed: JA25 Expected date:  Expected time:  Means of arrival:  Comments: 31 yo f shortness of breath
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
## 2017-02-21 NOTE — Progress Notes (Signed)
1225 Arrived to evaluate this 31 yo G3P1 @ 27.[redacted] wks GA in with complaints of SOB and CP with coughing.  Pt denies abdominal pain, vaginal bleeding or LOF and reports good fetal movement.  She gets care at the clinic at [facility]  She reports an admission with pneumonia during this pregnancy.  She has a history of asthma.  She denies any complications with pregnancy other than the pneumonia admission. 1320 FHR reactive and reassuring for gestational age.  Tracing maternal intermittently, adjusted and fetal movement palpated throughout.1331  Dr. Adrian Blackwater notified of pt in ED and of above.  States pt can be OB cleared. He request call from EDP if pt requires transfer for medical admission. 1340  EDP notified pt OB cleared and to call [phone] for admission if needed.

## 2017-02-21 NOTE — ED Notes (Signed)
Failed attempt to collect labs. Pt stated that she did not want to be stuck again and that she was tired.

## 2017-02-21 NOTE — ED Notes (Signed)
When speaking with MD, pt stated that she did not want to be stuck again. She was tired of [facility] & that we did nothing to help her. MD stated her plan of care while in the ED & asked what else we could do to take care of her? That we needed lab work and it was very important. MD stated that she would be leaving against medical advice because MD felt that she should stay. Pt insisted on leaving, while cussing at MD. MD asked her to let her send her home with prescriptions or an inhaler. Pt left.

## 2017-03-08 ENCOUNTER — Inpatient Hospital Stay (HOSPITAL_COMMUNITY)
Admission: AD | Admit: 2017-03-08 | Discharge: 2017-03-08 | Disposition: A | Discharge status: Home / Self Care | Payer: Medicaid Other | Source: Ambulatory Visit | Attending: Family Medicine | Admitting: Family Medicine

## 2017-03-08 ENCOUNTER — Encounter (HOSPITAL_COMMUNITY): Payer: Self-pay

## 2017-03-08 DIAGNOSIS — O4700 False labor before 37 completed weeks of gestation, unspecified trimester: Secondary | ICD-10-CM

## 2017-03-08 DIAGNOSIS — Z7952 Long term (current) use of systemic steroids: Secondary | ICD-10-CM | POA: Diagnosis not present

## 2017-03-08 DIAGNOSIS — O99513 Diseases of the respiratory system complicating pregnancy, third trimester: Secondary | ICD-10-CM | POA: Diagnosis not present

## 2017-03-08 DIAGNOSIS — Z87891 Personal history of nicotine dependence: Secondary | ICD-10-CM | POA: Diagnosis not present

## 2017-03-08 DIAGNOSIS — O26893 Other specified pregnancy related conditions, third trimester: Secondary | ICD-10-CM | POA: Insufficient documentation

## 2017-03-08 DIAGNOSIS — O34219 Maternal care for unspecified type scar from previous cesarean delivery: Secondary | ICD-10-CM | POA: Diagnosis not present

## 2017-03-08 DIAGNOSIS — R102 Pelvic and perineal pain: Secondary | ICD-10-CM | POA: Diagnosis not present

## 2017-03-08 DIAGNOSIS — Z833 Family history of diabetes mellitus: Secondary | ICD-10-CM | POA: Diagnosis not present

## 2017-03-08 DIAGNOSIS — Z809 Family history of malignant neoplasm, unspecified: Secondary | ICD-10-CM | POA: Diagnosis not present

## 2017-03-08 DIAGNOSIS — Z792 Long term (current) use of antibiotics: Secondary | ICD-10-CM | POA: Insufficient documentation

## 2017-03-08 DIAGNOSIS — O4703 False labor before 37 completed weeks of gestation, third trimester: Secondary | ICD-10-CM

## 2017-03-08 DIAGNOSIS — N898 Other specified noninflammatory disorders of vagina: Secondary | ICD-10-CM | POA: Diagnosis not present

## 2017-03-08 DIAGNOSIS — Z79899 Other long term (current) drug therapy: Secondary | ICD-10-CM | POA: Insufficient documentation

## 2017-03-08 DIAGNOSIS — Z3A29 29 weeks gestation of pregnancy: Secondary | ICD-10-CM | POA: Diagnosis not present

## 2017-03-08 LAB — URINALYSIS, ROUTINE W REFLEX MICROSCOPIC
Bilirubin Urine: NEGATIVE
Glucose, UA: NEGATIVE mg/dL
Hgb urine dipstick: NEGATIVE
KETONES UR: NEGATIVE mg/dL
Nitrite: NEGATIVE
PH: 7 (ref 5.0–8.0)
PROTEIN: NEGATIVE mg/dL
Specific Gravity, Urine: 1.017 (ref 1.005–1.030)

## 2017-03-08 LAB — RAPID URINE DRUG SCREEN, HOSP PERFORMED
AMPHETAMINES: NOT DETECTED
BENZODIAZEPINES: NOT DETECTED
Barbiturates: NOT DETECTED
COCAINE: POSITIVE — AB
Opiates: NOT DETECTED
Tetrahydrocannabinol: POSITIVE — AB

## 2017-03-08 LAB — WET PREP, GENITAL
CLUE CELLS WET PREP: NONE SEEN
SPERM: NONE SEEN
TRICH WET PREP: NONE SEEN
YEAST WET PREP: NONE SEEN

## 2017-03-08 LAB — FETAL FIBRONECTIN: Fetal Fibronectin: NEGATIVE

## 2017-03-08 MED ORDER — NIFEDIPINE 10 MG PO CAPS
10.0000 mg | ORAL_CAPSULE | Freq: Once | ORAL | Status: AC
  Administered 2017-03-08: 10 mg via ORAL
  Filled 2017-03-08: qty 1

## 2017-03-08 NOTE — Discharge Instructions (Signed)
Pelvic Rest °Pelvic rest may be recommended if: °· Your placenta is partially or completely covering the opening of your cervix (placenta previa). °· There is bleeding between the wall of the uterus and the amniotic sac in the first trimester of pregnancy (subchorionic hemorrhage). °· You went into labor too early (preterm labor). ° °Based on your overall health and the health of your baby, your health care provider will decide if pelvic rest is right for you. °How do I rest my pelvis? °For as long as told by your health care provider: °· Do not have sex, sexual stimulation, or an orgasm. °· Do not use tampons. Do not douche. Do not put anything in your vagina. °· Do not lift anything that is heavier than 10 lb (4.5 kg). °· Avoid activities that take a lot of effort (are strenuous). °· Avoid any activity in which your pelvic muscles could become strained. ° °When should I seek medical care? °Seek medical care if you have: °· Cramping pain in your lower abdomen. °· Vaginal discharge. °· A low, dull backache. °· Regular contractions. °· Uterine tightening. ° °When should I seek immediate medical care? °Seek immediate medical care if: °· You have vaginal bleeding and you are pregnant. ° °This information is not intended to replace advice given to you by your health care provider. Make sure you discuss any questions you have with your health care provider. °Document Released: 10/08/2010 Document Revised: 11/19/2015 Document Reviewed: 12/15/2014 °Elsevier Interactive Patient Education © 2018 [employer] Inc[employer] ° ° °Preterm Labor and Birth Information °Pregnancy normally lasts 39-41 weeks. Preterm labor is when labor starts early. It starts before you have been pregnant for 37 whole weeks. °What are the risk factors for preterm labor? °Preterm labor is more likely to occur in women who: °· Have an infection while pregnant. °· Have a cervix that is short. °· Have gone into preterm labor before. °· Have had surgery on their  cervix. °· Are younger than age 17. °· Are older than age 35. °· Are African American. °· Are pregnant with two or more babies. °· Take street drugs while pregnant. °· Smoke while pregnant. °· Do not gain enough weight while pregnant. °· Got pregnant right after another pregnancy. ° °What are the symptoms of preterm labor? °Symptoms of preterm labor include: °· Cramps. The cramps may feel like the cramps some women get during their period. The cramps may happen with watery poop (diarrhea). °· Pain in the belly (abdomen). °· Pain in the lower back. °· Regular contractions or tightening. It may feel like your belly is getting tighter. °· Pressure in the lower belly that seems to get stronger. °· More fluid (discharge) leaking from the vagina. The fluid may be watery or bloody. °· Water breaking. ° °Why is it important to notice signs of preterm labor? °Babies who are born early may not be fully developed. They have a higher chance for: °· Long-term heart problems. °· Long-term lung problems. °· Trouble controlling body systems, like breathing. °· Bleeding in the brain. °· A condition called cerebral palsy. °· Learning difficulties. °· Death. ° °These risks are highest for babies who are born before 34 weeks of pregnancy. °How is preterm labor treated? °Treatment depends on: °· How long you were pregnant. °· Your condition. °· The health of your baby. ° °Treatment may involve: °· Having a stitch (suture) placed in your cervix. When you give birth, your cervix opens so the baby can come out. The stitch keeps   the cervix from opening too soon. °· Staying at the hospital. °· Taking or getting medicines, such as: °? Hormone medicines. °? Medicines to stop contractions. °? Medicines to help the baby’s lungs develop. °? Medicines to prevent your baby from having cerebral palsy. ° °What should I do if I am in preterm labor? °If you think you are going into labor too soon, call your doctor right away. °How can I prevent preterm  labor? °· Do not use any tobacco products. °? Examples of these are cigarettes, chewing tobacco, and e-cigarettes. °? If you need help quitting, ask your doctor. °· Do not use street drugs. °· Do not use any medicines unless you ask your doctor if they are safe for you. °· Talk with your doctor before taking any herbal supplements. °· Make sure you gain enough weight. °· Watch for infection. If you think you might have an infection, get it checked right away. °· If you have gone into preterm labor before, tell your doctor. °This information is not intended to replace advice given to you by your health care provider. Make sure you discuss any questions you have with your health care provider. °Document Released: 09/09/2008 Document Revised: 11/24/2015 Document Reviewed: 11/04/2015 °Elsevier Interactive Patient Education © 2018 Elsevier Inc. ° °

## 2017-03-08 NOTE — MAU Note (Signed)
Pt reports contractions since 0600, due date is 05/23/2017. No bleeding, no LOF. Reports some urges to push last night and that the doctors think she will deliver early.
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
## 2017-03-09 LAB — GC/CHLAMYDIA PROBE AMP (~~LOC~~) NOT AT ARMC
Chlamydia: NEGATIVE
NEISSERIA GONORRHEA: NEGATIVE

## 2017-03-13 ENCOUNTER — Telehealth: Payer: Self-pay | Admitting: General Practice

## 2017-03-13 ENCOUNTER — Encounter: Payer: Self-pay | Admitting: General Practice

## 2017-03-13 NOTE — Telephone Encounter (Signed)
Called and left message on VM in regards to follow OB appointment on 03/21/17 at 1:20pm  Patient has missed several appointments. Asked patient to give our office a call if unable to keep appointment.  Will mail letter as well.

## 2017-03-18 LAB — GLUCOSE, POCT (MANUAL RESULT ENTRY): POC Glucose: 138 mg/dl — AB (ref 70–99)

## 2017-03-21 ENCOUNTER — Encounter: Payer: Self-pay | Admitting: Family Medicine

## 2017-04-10 ENCOUNTER — Inpatient Hospital Stay (HOSPITAL_COMMUNITY)
Admission: AD | Admit: 2017-04-10 | Discharge: 2017-04-10 | Disposition: A | Discharge status: Home / Self Care | Payer: Medicaid Other | Source: Ambulatory Visit | Attending: Family Medicine | Admitting: Family Medicine

## 2017-04-10 ENCOUNTER — Encounter (HOSPITAL_COMMUNITY): Payer: Self-pay | Admitting: *Deleted

## 2017-04-10 DIAGNOSIS — O0933 Supervision of pregnancy with insufficient antenatal care, third trimester: Secondary | ICD-10-CM | POA: Insufficient documentation

## 2017-04-10 DIAGNOSIS — F17210 Nicotine dependence, cigarettes, uncomplicated: Secondary | ICD-10-CM | POA: Diagnosis not present

## 2017-04-10 DIAGNOSIS — O34211 Maternal care for low transverse scar from previous cesarean delivery: Secondary | ICD-10-CM | POA: Diagnosis not present

## 2017-04-10 DIAGNOSIS — O479 False labor, unspecified: Secondary | ICD-10-CM | POA: Diagnosis not present

## 2017-04-10 DIAGNOSIS — O4700 False labor before 37 completed weeks of gestation, unspecified trimester: Secondary | ICD-10-CM

## 2017-04-10 DIAGNOSIS — Z3A33 33 weeks gestation of pregnancy: Secondary | ICD-10-CM | POA: Diagnosis not present

## 2017-04-10 DIAGNOSIS — O99333 Smoking (tobacco) complicating pregnancy, third trimester: Secondary | ICD-10-CM | POA: Diagnosis not present

## 2017-04-10 NOTE — MAU Note (Signed)
Pt presents with c/o contractions since 0800 this morning.  Denies VB or LOF.  Reports +FM.

## 2017-04-10 NOTE — Discharge Instructions (Signed)
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
## 2017-04-11 ENCOUNTER — Inpatient Hospital Stay (HOSPITAL_COMMUNITY)
Admission: AD | Admit: 2017-04-11 | Discharge: 2017-04-12 | Disposition: A | Discharge status: Home / Self Care | Payer: Medicaid Other | Source: Ambulatory Visit | Attending: Family Medicine | Admitting: Family Medicine

## 2017-04-11 ENCOUNTER — Encounter (HOSPITAL_COMMUNITY): Payer: Self-pay

## 2017-04-11 DIAGNOSIS — O34211 Maternal care for low transverse scar from previous cesarean delivery: Secondary | ICD-10-CM | POA: Diagnosis not present

## 2017-04-11 DIAGNOSIS — O4703 False labor before 37 completed weeks of gestation, third trimester: Secondary | ICD-10-CM | POA: Diagnosis not present

## 2017-04-11 DIAGNOSIS — F17210 Nicotine dependence, cigarettes, uncomplicated: Secondary | ICD-10-CM | POA: Diagnosis not present

## 2017-04-11 DIAGNOSIS — O99333 Smoking (tobacco) complicating pregnancy, third trimester: Secondary | ICD-10-CM | POA: Diagnosis not present

## 2017-04-11 DIAGNOSIS — Z3A34 34 weeks gestation of pregnancy: Secondary | ICD-10-CM | POA: Insufficient documentation

## 2017-04-11 LAB — URINALYSIS, ROUTINE W REFLEX MICROSCOPIC
Bilirubin Urine: NEGATIVE
GLUCOSE, UA: NEGATIVE mg/dL
Hgb urine dipstick: NEGATIVE
KETONES UR: NEGATIVE mg/dL
Nitrite: NEGATIVE
PH: 6 (ref 5.0–8.0)
Protein, ur: NEGATIVE mg/dL
SPECIFIC GRAVITY, URINE: 1.02 (ref 1.005–1.030)

## 2017-04-11 MED ORDER — NIFEDIPINE 10 MG PO CAPS
10.0000 mg | ORAL_CAPSULE | ORAL | Status: AC
Start: 2017-04-11 — End: 2017-04-11
  Administered 2017-04-11 (×3): 10 mg via ORAL
  Filled 2017-04-11 (×3): qty 1
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
## 2017-04-11 NOTE — MAU Note (Signed)
Patient having contraction pain 8/10 for about an hour. No leaking of fluid or bleeding. + FM

## 2017-04-12 LAB — RAPID URINE DRUG SCREEN, HOSP PERFORMED
AMPHETAMINES: NOT DETECTED
BENZODIAZEPINES: NOT DETECTED
Barbiturates: NOT DETECTED
Cocaine: POSITIVE — AB
OPIATES: NOT DETECTED
Tetrahydrocannabinol: POSITIVE — AB

## 2017-04-12 NOTE — Discharge Instructions (Signed)

## 2017-04-13 ENCOUNTER — Encounter: Payer: Self-pay | Admitting: Obstetrics and Gynecology

## 2017-05-08 ENCOUNTER — Emergency Department (HOSPITAL_COMMUNITY)
Admission: EM | Admit: 2017-05-08 | Discharge: 2017-05-08 | Disposition: A | Discharge status: Home / Self Care | Payer: Medicaid Other | Attending: Emergency Medicine | Admitting: Emergency Medicine

## 2017-05-08 ENCOUNTER — Other Ambulatory Visit: Payer: Self-pay

## 2017-05-08 ENCOUNTER — Encounter (HOSPITAL_COMMUNITY): Payer: Self-pay

## 2017-05-08 DIAGNOSIS — O99513 Diseases of the respiratory system complicating pregnancy, third trimester: Secondary | ICD-10-CM | POA: Insufficient documentation

## 2017-05-08 DIAGNOSIS — J45909 Unspecified asthma, uncomplicated: Secondary | ICD-10-CM | POA: Diagnosis not present

## 2017-05-08 DIAGNOSIS — Z3A38 38 weeks gestation of pregnancy: Secondary | ICD-10-CM | POA: Diagnosis not present

## 2017-05-08 DIAGNOSIS — R1030 Lower abdominal pain, unspecified: Secondary | ICD-10-CM | POA: Diagnosis not present

## 2017-05-08 DIAGNOSIS — O2693 Pregnancy related conditions, unspecified, third trimester: Secondary | ICD-10-CM | POA: Diagnosis not present

## 2017-05-08 DIAGNOSIS — F17210 Nicotine dependence, cigarettes, uncomplicated: Secondary | ICD-10-CM | POA: Insufficient documentation

## 2017-05-08 DIAGNOSIS — R109 Unspecified abdominal pain: Secondary | ICD-10-CM

## 2017-05-08 LAB — COMPREHENSIVE METABOLIC PANEL
ALT: 12 U/L — AB (ref 14–54)
ANION GAP: 9 (ref 5–15)
AST: 15 U/L (ref 15–41)
Albumin: 3.3 g/dL — ABNORMAL LOW (ref 3.5–5.0)
Alkaline Phosphatase: 102 U/L (ref 38–126)
BUN: 5 mg/dL — ABNORMAL LOW (ref 6–20)
CALCIUM: 9.1 mg/dL (ref 8.9–10.3)
CHLORIDE: 101 mmol/L (ref 101–111)
CO2: 21 mmol/L — AB (ref 22–32)
CREATININE: 0.54 mg/dL (ref 0.44–1.00)
GFR calc non Af Amer: >60 mL/min (ref >60)
Glucose, Bld: 83 mg/dL (ref 65–99)
Potassium: 3.6 mmol/L (ref 3.5–5.1)
SODIUM: 131 mmol/L — AB (ref 135–145)
TOTAL PROTEIN: 7.4 g/dL (ref 6.5–8.1)
Total Bilirubin: 0.6 mg/dL (ref 0.3–1.2)

## 2017-05-08 LAB — CBC WITH DIFFERENTIAL/PLATELET
Basophils Absolute: 0 10*3/uL (ref 0.0–0.1)
Basophils Relative: 0 %
EOS ABS: 0.1 10*3/uL (ref 0.0–0.7)
EOS PCT: 1 %
HCT: 35.4 % — ABNORMAL LOW (ref 36.0–46.0)
Hemoglobin: 12 g/dL (ref 12.0–15.0)
LYMPHS ABS: 2.3 10*3/uL (ref 0.7–4.0)
Lymphocytes Relative: 24 %
MCH: 32.1 pg (ref 26.0–34.0)
MCHC: 33.9 g/dL (ref 30.0–36.0)
MCV: 94.7 fL (ref 78.0–100.0)
Monocytes Absolute: 0.6 10*3/uL (ref 0.1–1.0)
Monocytes Relative: 6 %
Neutro Abs: 6.5 10*3/uL (ref 1.7–7.7)
Neutrophils Relative %: 69 %
PLATELETS: 313 10*3/uL (ref 150–400)
RBC: 3.74 MIL/uL — AB (ref 3.87–5.11)
RDW: 13.3 % (ref 11.5–15.5)
WBC: 9.4 10*3/uL (ref 4.0–10.5)

## 2017-05-08 LAB — URINALYSIS, ROUTINE W REFLEX MICROSCOPIC
BILIRUBIN URINE: NEGATIVE
Glucose, UA: NEGATIVE mg/dL
HGB URINE DIPSTICK: NEGATIVE
KETONES UR: NEGATIVE mg/dL
NITRITE: NEGATIVE
PROTEIN: NEGATIVE mg/dL
SPECIFIC GRAVITY, URINE: 1.027 (ref 1.005–1.030)
pH: 6 (ref 5.0–8.0)

## 2017-05-08 MED ORDER — NITROFURANTOIN MONOHYD MACRO 100 MG PO CAPS: 100.0000 mg | ORAL_CAPSULE | Freq: Two times a day (BID) | ORAL | 0 refills | Status: DC

## 2017-05-08 MED ORDER — MORPHINE SULFATE (PF) 4 MG/ML IV SOLN: 4.0000 mg | Freq: Once | INTRAVENOUS | Status: DC

## 2017-05-08 MED ORDER — SODIUM CHLORIDE 0.9 % IV BOLUS (SEPSIS)
1000.0000 mL | Freq: Once | INTRAVENOUS | Status: AC
  Administered 2017-05-08: 1000 mL via INTRAVENOUS

## 2017-05-08 MED ORDER — MORPHINE SULFATE (PF) 4 MG/ML IV SOLN
2.0000 mg | Freq: Once | INTRAVENOUS | Status: AC
  Administered 2017-05-08: 2 mg via INTRAVENOUS
  Filled 2017-05-08: qty 1

## 2017-05-08 NOTE — ED Notes (Signed)
Rapid OB in room.

## 2017-05-08 NOTE — ED Triage Notes (Signed)
Pt was here visiting family and began having contractions. Pt is pregnant and due 05/23/17. Pt cannot estimate how far apart contractions are. VSS. FHR 148 on toco. Rapid OB RN notified and on the way. Dr Hyacinth MeekerMiller in room. Pt denies vagina d/c.
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
## 2017-05-08 NOTE — ED Notes (Signed)
Pt stated she needs to leave for work in 10 minutes. MD notified.

## 2017-05-08 NOTE — Progress Notes (Signed)
Spoke with Dr. Shawnie PonsPratt. Pt is a G3P2 at 337 6/[redacted] weeks gestation here with c/o lower abd pain. Pt is a previous C/S. No vaginal bleeding or leaking of fluid. FHR tracing is a category 1 tracing with 1 uc and ui. Cervix is 1-2 cm, 50% effaced, and presenting part is a -3 station. [facility] for any pregnancy related concerns.Pt is OB cleared.

## 2017-05-08 NOTE — Discharge Instructions (Signed)
Please go to the [facility]'s hospital immediately if you develop increasing pain vomiting or fevers.  Take Macrobid 100 mg by mouth twice daily for 5 days.

## 2017-05-08 NOTE — Progress Notes (Signed)
Pt is a G3P1 at 37 6/[redacted] weeks gestation here with c/o lower abd pain that started about 1 hour ago after she ate a salad. Pt denies vaginal bleeding or leaking of fluid Says [facility]. Says she had a C/S with her 1st child, but doesn't remember why.

## 2017-05-09 LAB — URINE CULTURE: Culture: NO GROWTH

## 2017-05-22 ENCOUNTER — Telehealth (HOSPITAL_COMMUNITY): Payer: Self-pay | Admitting: *Deleted

## 2017-05-22 ENCOUNTER — Other Ambulatory Visit: Payer: Self-pay

## 2017-05-22 ENCOUNTER — Inpatient Hospital Stay (HOSPITAL_COMMUNITY)
Admission: AD | Admit: 2017-05-22 | Discharge: 2017-05-22 | Disposition: A | Discharge status: Home / Self Care | Payer: Medicaid Other | Source: Ambulatory Visit | Attending: Obstetrics & Gynecology | Admitting: Obstetrics & Gynecology

## 2017-05-22 ENCOUNTER — Encounter (HOSPITAL_COMMUNITY): Payer: Self-pay

## 2017-05-22 DIAGNOSIS — Z3483 Encounter for supervision of other normal pregnancy, third trimester: Secondary | ICD-10-CM | POA: Diagnosis present

## 2017-05-22 DIAGNOSIS — Z0371 Encounter for suspected problem with amniotic cavity and membrane ruled out: Secondary | ICD-10-CM

## 2017-05-22 DIAGNOSIS — Z3A39 39 weeks gestation of pregnancy: Secondary | ICD-10-CM | POA: Diagnosis not present

## 2017-05-22 DIAGNOSIS — O0933 Supervision of pregnancy with insufficient antenatal care, third trimester: Secondary | ICD-10-CM | POA: Insufficient documentation

## 2017-05-22 LAB — WET PREP, GENITAL
Clue Cells Wet Prep HPF POC: NONE SEEN
SPERM: NONE SEEN
Trich, Wet Prep: NONE SEEN
YEAST WET PREP: NONE SEEN

## 2017-05-22 LAB — AMNISURE RUPTURE OF MEMBRANE (ROM) NOT AT ARMC: AMNISURE: NEGATIVE

## 2017-05-22 LAB — OB RESULTS CONSOLE GBS: STREP GROUP B AG: NEGATIVE

## 2017-05-22 LAB — OB RESULTS CONSOLE GC/CHLAMYDIA: Gonorrhea: NEGATIVE

## 2017-05-22 NOTE — Discharge Instructions (Signed)
Trial of Labor After Cesarean Delivery A trial of labor after cesarean delivery (TOLAC) is when a woman tries to give birth vaginally after a previous cesarean delivery. TOLAC may be a safe and appropriate option for you depending on your medical history and other risk factors. When TOLAC is successful and you are able to have a vaginal delivery, this is called a vaginal birth after cesarean delivery (VBAC). Candidates for TOLAC TOLAC is possible for some women who:  Have undergone one or two prior cesarean deliveries in which the incision of the uterus was horizontal (low transverse).  Are carrying twins and have had one prior low transverse incision during a cesarean delivery.  Do not have a vertical (classical) uterine scar.  Have not had a tear in the wall of their uterus (uterine rupture).  TOLAC is also supported for women who meet appropriate criteria and:  Are under the age of 40 years.  Are tall and have a body mass index (BMI) of less than 30.  Have an unknown uterine scar.  Give birth in a facility equipped to handle an emergency cesarean delivery. This team should be able to handle possible complications such as a uterine rupture.  Have thorough counseling about the benefits and risks of TOLAC.  Have discussed future pregnancy plans with their health care provider.  Plan to have several more pregnancies.  Most successful candidates for TOLAC:  Have had a successful vaginal delivery before or after their cesarean delivery.  Experience labor that begins naturally on or before the due date (40 weeks of gestation).  Do not have a very large (macrosomic) baby.  Had a prior cesarean delivery but are not currently experiencing factors that would prompt a cesarean delivery (such as a breech position).  Had only one prior cesarean delivery.  Had a prior cesarean delivery that was performed early in labor and not after full cervical dilation. TOLAC may be most appropriate  for women who meet the above guidelines and who plan to have more pregnancies. TOLAC is not recommended for home births. Least successful candidates for TOLAC:  Have an induced labor with an unfavorable cervix. An unfavorable cervix is when the cervix is not dilating enough (among other factors).  Have never had a vaginal delivery.  Have had more than two cesarean deliveries.  Have a pregnancy at more than 40 weeks of gestation.  Are pregnant with a baby with a suspected weight greater than 4,000 grams (8 pounds) and who have no prior history of a vaginal delivery.  Have closely spaced pregnancies. Suggested benefits of TOLAC  You may have a faster recovery time.  You may have a shorter stay in the hospital.  You may have less pain and fewer problems than with a cesarean delivery. Women who have a cesarean delivery have a higher chance of needing blood or getting a fever, an infection, or a blood clot in the legs. Suggested risks of TOLAC The highest risk of complications happens to women who attempt a TOLAC and fail. A failed TOLAC results in an unplanned cesarean delivery. Risks related to TOLAC or repeat cesarean deliveries include:  Blood loss.  Infection.  Blood clot.  Injury to surrounding tissues or organs.  Having to remove the uterus (hysterectomy).  Potential problems with the placenta (such as placenta previa or placenta accreta) in future pregnancies.  Although very rare, the main concerns with TOLAC are:  Rupture of the uterine scar from a past cesarean delivery.  Needing an emergency   cesarean delivery.  Having a bad outcome for the baby (perinatal morbidity).  Where to find more information:  American Congress of Obstetricians and Gynecologists: www.acog.org  American College of Nurse-Midwives: www.midwife.org This information is not intended to replace advice given to you by your health care provider. Make sure you discuss any questions you have with  your health care provider. Document Released: 03/01/2011 Document Revised: 05/11/2016 Document Reviewed: 12/03/2012 Elsevier Interactive Patient Education  2018 [employer] Inc[employer]  

## 2017-05-22 NOTE — Telephone Encounter (Signed)
Preadmission screen  

## 2017-05-22 NOTE — MAU Provider Note (Signed)
S: Ms. Monica Holloway is a 31 y.o. G3P1011 at 7917w6d  who presents to MAU today complaining of leaking of fluid since last night. She denies vaginal bleeding. She endorses contractions. She reports normal fetal movement.    O: BP 123/68 (BP Location: Right Arm)   Pulse 99   Temp 98.3 F (36.8 C) (Oral)   Resp 18   Wt 186 lb 4 oz (84.5 kg)   LMP 05/11/2016   SpO2 98%   BMI 32.99 kg/m  GENERAL: Well-developed, well-nourished female in no acute distress.  HEAD: Normocephalic, atraumatic.  CHEST: Normal effort of breathing, regular heart rate ABDOMEN: Soft, nontender, gravid PELVIC: Normal external female genitalia. Vagina is pink and rugated. Cervix with normal contour, no lesions. Normal discharge.  no pooling.   Cervical exam:  Dilation: 1 Effacement (%): 80 Cervical Position: Posterior Station: -2 Presentation: Vertex Exam by:: Weston,RN    Fetal Monitoring: Baseline: 140 Variability: moderate Accelerations: 15x15 Decelerations: mild variable Contractions: irregular  Results for orders placed or performed during the hospital encounter of 05/22/17 (from the past 24 hour(s))  [facility]     Status: None   Collection Time: 05/22/17 12:45 PM  Result Value Ref Range   Amnisure ROM NEGATIVE   Wet prep, genital     Status: Abnormal   Collection Time: 05/22/17  1:51 PM  Result Value Ref Range   Yeast Wet Prep HPF POC NONE SEEN NONE SEEN   Trich, Wet Prep NONE SEEN NONE SEEN   Clue Cells Wet Prep HPF POC NONE SEEN NONE SEEN   WBC, Wet Prep HPF POC MANY (A) NONE SEEN   Sperm NONE SEEN     Reviewed with Dr. Macon LargeAnyanwu. Patient has had no prenatal care since initial visit at beginning of pregnancy. Fetal tracing reviewed by Dr. Macon LargeAnyanwu. Will discharge home. IOL scheduled for 12/4. F/u appt in clinic for BPP/NST/prenatal visit scheduled for 11/29.  A: 1. Encounter for suspected PROM, with rupture of membranes not found   2. [redacted] weeks gestation  of pregnancy   3. Limited prenatal care in third trimester      P: Discharge home Pt to f/u in office on Thursday afternoon -- stressed importance of keeping appt GBS, GC/CT, & wet prep pending Discussed reasons to return to MAU   Judeth HornLawrence, Jackie Russman, NP 05/22/2017 1:36 PM

## 2017-05-22 NOTE — MAU Note (Signed)
[facility] "hasn't been in a minute".started having contractions yesterday, off and on. ? Leaking last night, "drips".  Due date is tomorrow

## 2017-05-23 ENCOUNTER — Other Ambulatory Visit: Payer: Self-pay | Admitting: Advanced Practice Midwife

## 2017-05-23 LAB — GC/CHLAMYDIA PROBE AMP (~~LOC~~) NOT AT ARMC
Chlamydia: NEGATIVE
Neisseria Gonorrhea: NEGATIVE

## 2017-05-24 ENCOUNTER — Encounter (HOSPITAL_COMMUNITY): Payer: Self-pay | Admitting: *Deleted

## 2017-05-24 ENCOUNTER — Inpatient Hospital Stay (HOSPITAL_COMMUNITY): Payer: Medicaid Other | Admitting: Anesthesiology

## 2017-05-24 ENCOUNTER — Inpatient Hospital Stay (HOSPITAL_COMMUNITY)
Admission: AD | Admit: 2017-05-24 | Discharge: 2017-05-26 | DRG: 806 | Disposition: A | Discharge status: Home / Self Care | Payer: Medicaid Other | Source: Ambulatory Visit | Attending: Family Medicine | Admitting: Family Medicine

## 2017-05-24 DIAGNOSIS — Z3483 Encounter for supervision of other normal pregnancy, third trimester: Secondary | ICD-10-CM | POA: Diagnosis present

## 2017-05-24 DIAGNOSIS — O34219 Maternal care for unspecified type scar from previous cesarean delivery: Secondary | ICD-10-CM | POA: Diagnosis present

## 2017-05-24 DIAGNOSIS — F17210 Nicotine dependence, cigarettes, uncomplicated: Secondary | ICD-10-CM | POA: Diagnosis present

## 2017-05-24 DIAGNOSIS — O99324 Drug use complicating childbirth: Secondary | ICD-10-CM | POA: Diagnosis present

## 2017-05-24 DIAGNOSIS — F1490 Cocaine use, unspecified, uncomplicated: Secondary | ICD-10-CM | POA: Diagnosis present

## 2017-05-24 DIAGNOSIS — Z23 Encounter for immunization: Secondary | ICD-10-CM

## 2017-05-24 DIAGNOSIS — O99334 Smoking (tobacco) complicating childbirth: Secondary | ICD-10-CM | POA: Diagnosis present

## 2017-05-24 DIAGNOSIS — Z3A40 40 weeks gestation of pregnancy: Secondary | ICD-10-CM | POA: Diagnosis not present

## 2017-05-24 DIAGNOSIS — F1290 Cannabis use, unspecified, uncomplicated: Secondary | ICD-10-CM | POA: Diagnosis present

## 2017-05-24 DIAGNOSIS — O0933 Supervision of pregnancy with insufficient antenatal care, third trimester: Secondary | ICD-10-CM

## 2017-05-24 LAB — RAPID URINE DRUG SCREEN, HOSP PERFORMED
Amphetamines: NOT DETECTED
BARBITURATES: NOT DETECTED
BENZODIAZEPINES: NOT DETECTED
COCAINE: NOT DETECTED
Opiates: NOT DETECTED
TETRAHYDROCANNABINOL: NOT DETECTED

## 2017-05-24 LAB — CBC
HCT: 38 % (ref 36.0–46.0)
Hemoglobin: 12.5 g/dL (ref 12.0–15.0)
MCH: 32.1 pg (ref 26.0–34.0)
MCHC: 32.9 g/dL (ref 30.0–36.0)
MCV: 97.4 fL (ref 78.0–100.0)
PLATELETS: 236 10*3/uL (ref 150–400)
RBC: 3.9 MIL/uL (ref 3.87–5.11)
RDW: 13.6 % (ref 11.5–15.5)
WBC: 12.3 10*3/uL — AB (ref 4.0–10.5)

## 2017-05-24 LAB — RAPID HIV SCREEN (HIV 1/2 AB+AG)
HIV 1/2 Antibodies: NONREACTIVE
HIV-1 P24 Antigen - HIV24: NONREACTIVE

## 2017-05-24 LAB — TYPE AND SCREEN
ABO/RH(D): B POS
ANTIBODY SCREEN: NEGATIVE

## 2017-05-24 LAB — CULTURE, BETA STREP (GROUP B ONLY)

## 2017-05-24 LAB — RPR: RPR: NONREACTIVE

## 2017-05-24 MED ORDER — IBUPROFEN 600 MG PO TABS
600.0000 mg | ORAL_TABLET | Freq: Four times a day (QID) | ORAL | Status: DC
  Administered 2017-05-24 – 2017-05-26 (×4): 600 mg via ORAL
  Filled 2017-05-24 (×4): qty 1

## 2017-05-24 MED ORDER — ONDANSETRON HCL 4 MG/2ML IJ SOLN: 4.0000 mg | Freq: Four times a day (QID) | INTRAMUSCULAR | Status: DC | PRN

## 2017-05-24 MED ORDER — IBUPROFEN 600 MG PO TABS
600.0000 mg | ORAL_TABLET | Freq: Four times a day (QID) | ORAL | Status: DC
  Administered 2017-05-24 – 2017-05-25 (×5): 600 mg via ORAL
  Filled 2017-05-24 (×6): qty 1

## 2017-05-24 MED ORDER — BENZOCAINE-MENTHOL 20-0.5 % EX AERO
1.0000 "application " | INHALATION_SPRAY | CUTANEOUS | Status: DC | PRN
  Administered 2017-05-25: 1 via TOPICAL
  Filled 2017-05-24: qty 56

## 2017-05-24 MED ORDER — LIDOCAINE HCL (PF) 1 % IJ SOLN
30.0000 mL | INTRAMUSCULAR | Status: DC | PRN
  Filled 2017-05-24: qty 30

## 2017-05-24 MED ORDER — FENTANYL CITRATE (PF) 100 MCG/2ML IJ SOLN
100.0000 ug | INTRAMUSCULAR | Status: DC | PRN
  Administered 2017-05-24: 100 ug via INTRAVENOUS
  Filled 2017-05-24: qty 2

## 2017-05-24 MED ORDER — DIPHENHYDRAMINE HCL 25 MG PO CAPS
25.0000 mg | ORAL_CAPSULE | Freq: Four times a day (QID) | ORAL | Status: DC | PRN
Start: 2017-05-24 — End: 2017-05-26

## 2017-05-24 MED ORDER — SODIUM CHLORIDE 0.9 % IV SOLN
2.0000 g | Freq: Once | INTRAVENOUS | Status: DC
  Administered 2017-05-24: 2 g via INTRAVENOUS
  Filled 2017-05-24: qty 2000

## 2017-05-24 MED ORDER — ONDANSETRON HCL 4 MG PO TABS: 4.0000 mg | ORAL_TABLET | ORAL | Status: DC | PRN

## 2017-05-24 MED ORDER — LACTATED RINGERS IV SOLN: 500.0000 mL | INTRAVENOUS | Status: DC | PRN

## 2017-05-24 MED ORDER — TETANUS-DIPHTH-ACELL PERTUSSIS 5-2.5-18.5 LF-MCG/0.5 IM SUSP
0.5000 mL | Freq: Once | INTRAMUSCULAR | Status: AC
  Administered 2017-05-25: 0.5 mL via INTRAMUSCULAR
  Filled 2017-05-24: qty 0.5

## 2017-05-24 MED ORDER — SENNOSIDES-DOCUSATE SODIUM 8.6-50 MG PO TABS
2.0000 | ORAL_TABLET | ORAL | Status: DC
  Administered 2017-05-24 – 2017-05-25 (×2): 2 via ORAL
  Filled 2017-05-24 (×2): qty 2

## 2017-05-24 MED ORDER — ACETAMINOPHEN 325 MG PO TABS
650.0000 mg | ORAL_TABLET | ORAL | Status: DC | PRN
  Administered 2017-05-24 – 2017-05-25 (×4): 650 mg via ORAL
  Filled 2017-05-24 (×4): qty 2

## 2017-05-24 MED ORDER — LIDOCAINE-EPINEPHRINE (PF) 2 %-1:200000 IJ SOLN
INTRAMUSCULAR | Status: DC | PRN
  Administered 2017-05-24 (×2): 5 mL via EPIDURAL

## 2017-05-24 MED ORDER — EPHEDRINE 5 MG/ML INJ
10.0000 mg | INTRAVENOUS | Status: DC | PRN
  Filled 2017-05-24: qty 2

## 2017-05-24 MED ORDER — SIMETHICONE 80 MG PO CHEW: 80.0000 mg | CHEWABLE_TABLET | ORAL | Status: DC | PRN

## 2017-05-24 MED ORDER — LACTATED RINGERS IV SOLN: 500.0000 mL | Freq: Once | INTRAVENOUS | Status: DC

## 2017-05-24 MED ORDER — PHENYLEPHRINE 40 MCG/ML (10ML) SYRINGE FOR IV PUSH (FOR BLOOD PRESSURE SUPPORT)
80.0000 ug | PREFILLED_SYRINGE | INTRAVENOUS | Status: DC | PRN
  Filled 2017-05-24: qty 5

## 2017-05-24 MED ORDER — OXYTOCIN 40 UNITS IN LACTATED RINGERS INFUSION - SIMPLE MED: 2.5000 [IU]/h | INTRAVENOUS | Status: DC

## 2017-05-24 MED ORDER — OXYTOCIN BOLUS FROM INFUSION
500.0000 mL | Freq: Once | INTRAVENOUS | Status: AC
  Administered 2017-05-24: 500 mL via INTRAVENOUS

## 2017-05-24 MED ORDER — DIBUCAINE 1 % RE OINT: 1.0000 "application " | TOPICAL_OINTMENT | RECTAL | Status: DC | PRN

## 2017-05-24 MED ORDER — OXYTOCIN 40 UNITS IN LACTATED RINGERS INFUSION - SIMPLE MED
INTRAVENOUS | Status: AC
  Filled 2017-05-24: qty 1000

## 2017-05-24 MED ORDER — OXYCODONE-ACETAMINOPHEN 5-325 MG PO TABS: 2.0000 | ORAL_TABLET | ORAL | Status: DC | PRN

## 2017-05-24 MED ORDER — DIPHENHYDRAMINE HCL 50 MG/ML IJ SOLN: 12.5000 mg | INTRAMUSCULAR | Status: DC | PRN

## 2017-05-24 MED ORDER — FENTANYL 2.5 MCG/ML BUPIVACAINE 1/10 % EPIDURAL INFUSION (WH - ANES)
14.0000 mL/h | INTRAMUSCULAR | Status: DC | PRN
  Administered 2017-05-24: 14 mL/h via EPIDURAL
  Filled 2017-05-24: qty 100

## 2017-05-24 MED ORDER — WITCH HAZEL-GLYCERIN EX PADS: 1.0000 "application " | MEDICATED_PAD | CUTANEOUS | Status: DC | PRN

## 2017-05-24 MED ORDER — OXYCODONE-ACETAMINOPHEN 5-325 MG PO TABS: 1.0000 | ORAL_TABLET | ORAL | Status: DC | PRN

## 2017-05-24 MED ORDER — LACTATED RINGERS IV SOLN
INTRAVENOUS | Status: DC
  Administered 2017-05-24: 07:00:00 via INTRAVENOUS

## 2017-05-24 MED ORDER — PRENATAL MULTIVITAMIN CH
1.0000 | ORAL_TABLET | Freq: Every day | ORAL | Status: DC
  Administered 2017-05-25 – 2017-05-26 (×2): 1 via ORAL
  Filled 2017-05-24 (×2): qty 1

## 2017-05-24 MED ORDER — ZOLPIDEM TARTRATE 5 MG PO TABS: 5.0000 mg | ORAL_TABLET | Freq: Every evening | ORAL | Status: DC | PRN

## 2017-05-24 MED ORDER — ACETAMINOPHEN 325 MG PO TABS: 650.0000 mg | ORAL_TABLET | ORAL | Status: DC | PRN

## 2017-05-24 MED ORDER — ONDANSETRON HCL 4 MG/2ML IJ SOLN: 4.0000 mg | INTRAMUSCULAR | Status: DC | PRN

## 2017-05-24 MED ORDER — SOD CITRATE-CITRIC ACID 500-334 MG/5ML PO SOLN: 30.0000 mL | ORAL | Status: DC | PRN

## 2017-05-24 MED ORDER — LIDOCAINE HCL (PF) 1 % IJ SOLN
INTRAMUSCULAR | Status: AC
  Filled 2017-05-24: qty 30

## 2017-05-24 MED ORDER — PHENYLEPHRINE 40 MCG/ML (10ML) SYRINGE FOR IV PUSH (FOR BLOOD PRESSURE SUPPORT)
80.0000 ug | PREFILLED_SYRINGE | INTRAVENOUS | Status: DC | PRN
  Filled 2017-05-24: qty 10
  Filled 2017-05-24: qty 5

## 2017-05-24 MED ORDER — COCONUT OIL OIL: 1.0000 "application " | TOPICAL_OIL | Status: DC | PRN

## 2017-05-24 MED ORDER — LIDOCAINE HCL (PF) 1 % IJ SOLN
INTRAMUSCULAR | Status: DC | PRN
  Administered 2017-05-24: 4 mL via EPIDURAL

## 2017-05-24 NOTE — Anesthesia Procedure Notes (Signed)
Epidural Patient location during procedure: OB Start time: 05/24/2017 4:01 AM End time: 05/24/2017 4:09 AM  Staffing Anesthesiologist: Shelton SilvasHollis, Azaylia Fong D, MD Performed: anesthesiologist   Preanesthetic Checklist Completed: patient identified, site marked, surgical consent, pre-op evaluation, timeout performed, IV checked, risks and benefits discussed and monitors and equipment checked  Epidural Patient position: sitting Prep: ChloraPrep Patient monitoring: heart rate, continuous pulse ox and blood pressure Approach: midline Location: L3-L4 Injection technique: LOR saline  Needle:  Needle type: Tuohy  Needle gauge: 17 G Needle length: 9 cm Catheter type: closed end flexible Catheter size: 20 Guage Test dose: negative and 1.5% lidocaine  Assessment Events: blood not aspirated, injection not painful, no injection resistance and no paresthesia  Additional Notes LOR @ 5.5  Patient identified. Risks/Benefits/Options discussed with patient including but not limited to bleeding, infection, nerve damage, paralysis, failed block, incomplete pain control, headache, blood pressure changes, nausea, vomiting, reactions to medications, itching and postpartum back pain. Confirmed with bedside nurse the patient's most recent platelet count. Confirmed with patient that they are not currently taking any anticoagulation, have any bleeding history or any family history of bleeding disorders. Patient expressed understanding and wished to proceed. All questions were answered. Sterile technique was used throughout the entire procedure. Please see nursing notes for vital signs. Test dose was given through epidural catheter and negative prior to continuing to dose epidural or start infusion. Warning signs of high block given to the patient including shortness of breath, tingling/numbness in hands, complete motor block, or any concerning symptoms with instructions to call for help. Patient was given instructions  on fall risk and not to get out of bed. All questions and concerns addressed with instructions to call with any issues or inadequate analgesia.    Reason for block:procedure for pain

## 2017-05-24 NOTE — Anesthesia Postprocedure Evaluation (Signed)
Anesthesia Post Note  Patient: Monica Holloway  Procedure(s) Performed: AN AD HOC LABOR EPIDURAL     Patient location during evaluation: Mother Baby Anesthesia Type: Epidural Level of consciousness: awake, awake and alert, oriented and patient cooperative Pain management: pain level controlled Vital Signs Assessment: post-procedure vital signs reviewed and stable Respiratory status: spontaneous breathing, nonlabored ventilation and respiratory function stable Cardiovascular status: stable Postop Assessment: no backache, no headache, patient able to bend at knees and no apparent nausea or vomiting Anesthetic complications: no    Last Vitals:  Vitals:   05/24/17 1046 05/24/17 1149  BP: 125/74 127/70  Pulse: 80 85  Resp: 15 16  Temp: (!) 36.3 C 36.7 C  SpO2:      Last Pain:  Vitals:   05/24/17 1149  TempSrc: Oral  PainSc: 0-No pain   Pain Goal:                 Tamala Manzer L
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
## 2017-05-24 NOTE — Anesthesia Preprocedure Evaluation (Signed)
Anesthesia Evaluation  Patient identified by MRN, date of birth, ID band Patient awake    Reviewed: Allergy & Precautions, Patient's Chart, lab work & pertinent test results  Airway Mallampati: II       Dental no notable dental hx.    Pulmonary asthma , Current Smoker,    Pulmonary exam normal        Cardiovascular Normal cardiovascular exam     Neuro/Psych PSYCHIATRIC DISORDERS Anxiety    GI/Hepatic   Endo/Other    Renal/GU      Musculoskeletal   Abdominal   Peds  Hematology   Anesthesia Other Findings   Reproductive/Obstetrics (+) Pregnancy                             Anesthesia Physical Anesthesia Plan  ASA: II  Anesthesia Plan: Epidural   Post-op Pain Management:    Induction:   PONV Risk Score and Plan:   Airway Management Planned:   Additional Equipment:   Intra-op Plan:   Post-operative Plan:   Informed Consent: I have reviewed the patients History and Physical, chart, labs and discussed the procedure including the risks, benefits and alternatives for the proposed anesthesia with the patient or authorized representative who has indicated his/her understanding and acceptance.     Plan Discussed with:   Anesthesia Plan Comments: (Lab Results            PLT                      236                 05/24/2017           )        Anesthesia Quick Evaluation

## 2017-05-25 ENCOUNTER — Encounter: Payer: Self-pay | Admitting: Family Medicine

## 2017-05-25 ENCOUNTER — Other Ambulatory Visit: Payer: Self-pay

## 2017-05-25 MED ORDER — MEDROXYPROGESTERONE ACETATE 150 MG/ML IM SUSP
150.0000 mg | Freq: Once | INTRAMUSCULAR | Status: AC
  Administered 2017-05-26: 150 mg via INTRAMUSCULAR
  Filled 2017-05-25: qty 1

## 2017-05-25 MED ORDER — PNEUMOCOCCAL VAC POLYVALENT 25 MCG/0.5ML IJ INJ
0.5000 mL | INJECTION | INTRAMUSCULAR | Status: AC
  Administered 2017-05-26: 0.5 mL via INTRAMUSCULAR
  Filled 2017-05-25: qty 0.5

## 2017-05-25 MED ORDER — INFLUENZA VAC SPLIT QUAD 0.5 ML IM SUSY
0.5000 mL | PREFILLED_SYRINGE | INTRAMUSCULAR | Status: AC
  Administered 2017-05-25: 0.5 mL via INTRAMUSCULAR
  Filled 2017-05-25: qty 0.5

## 2017-05-25 NOTE — Progress Notes (Signed)
POSTPARTUM PROGRESS NOTE  Post Partum Day 1 Subjective: Monica Holloway is a 31 year old 803P2012 40wTommi Rumpsks1d s/p VBAC. No acute events overnight. Has no problems ambulating or with PO intake. Urinating spontaneously, still no flatus or bowel movements. Is still bleeding and compares it to regular period with cramps. Pain is well controlled. Currently bottle feeding and requested to start Depo for contraceptive before leaving hospital.   Objective: Blood pressure 114/65, pulse 83, temperature 98.5 F (36.9 C), temperature source Oral, resp. rate 16, height 5\' 2"  (1.575 m), weight 79.7 kg (175 lb 9.6 oz), last menstrual period 05/11/2016, SpO2 100 %, unknown if currently breastfeeding.  Physical Exam:  General: alert, cooperative and no distress Lochia:normal flow Chest: no respiratory distress Heart:regular rate, distal pulses intact Abdomen: soft, nontender,  Uterine Fundus: firm, appropriately tender DVT Evaluation: No calf swelling or tenderness Extremities: No edema  Recent Labs    05/24/17 0318  HGB 12.5  HCT 38.0    Assessment/Plan:  ASSESSMENT: Tommi RumpsBrittany L Housand is a 31 y.o. [id number] 6823w1d s/p VBAC who is doing well today. Has history of cocaine and THC abuse and will need social work consult before discharge. Has several events of hypotension yesterday (BP 77/21, 86/70, 92/54) all blood pressure within normal limits today but will continue to monitor. Will plan on giving Depo shot before discharge due history of drug abuse.   Plan for patient to be discharges tomorrow.   LOS: 1 day   Atticus  Lum PA-S 05/25/2017, 9:00 AM   OB FELLOW MEDICAL STUDENT NOTE ATTESTATION  I confirm that I have verified the information documented in the medical student's note and that I have also personally performed the physical exam and all medical decision making activities. Highlights and modifications as noted below:  PPD #1 doing well. Bps are stable and wnl this morning Depo for  contraception, will give prior to d/c SW consult pending Plan to d/c home tomorrow  Frederik PearJulie P Dejha King, MD OB Fellow 05/25/2017, 9:30 AM
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
## 2017-05-26 ENCOUNTER — Encounter (HOSPITAL_COMMUNITY): Payer: Self-pay | Admitting: *Deleted

## 2017-05-26 ENCOUNTER — Other Ambulatory Visit: Payer: Self-pay

## 2017-05-26 MED ORDER — IBUPROFEN 600 MG PO TABS: 600.0000 mg | ORAL_TABLET | Freq: Four times a day (QID) | ORAL | 0 refills | Status: AC

## 2017-05-26 NOTE — Discharge Summary (Signed)
OB Discharge Summary     Patient Name: Monica Holloway DOB: 04-Oct-1985 MRN: [id number] Date of admission: 05/24/2017  Delivering MD: Pincus LargePHELPS, JAZMA Y )  Date of discharge: 05/26/2017    Admitting diagnosis: active labor Intrauterine pregnancy: 5611w1d    Secondary diagnosis:  Active Problems:   Patient Active Problem List   Diagnosis Date Noted  . Normal labor 05/24/2017  . VBAC (vaginal birth after Cesarean) 05/24/2017  . Limited prenatal care in third trimester 04/10/2017  . Pneumonia affecting pregnancy in second trimester 01/25/2017  . History of cesarean section 12/02/2016  . Abdominal pain during intrauterine pregnancy 11/02/2016  . 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 Supervision of high risk pregnancy, antepartum 08/18/2015    Additional problems: none     Discharge diagnosis: Term Pregnancy Delivered                                                                                                Post partum procedures:none  Complications: None  Hospital course:  Onset of Labor With Vaginal Delivery     31 y.o. yo [id number] at 311w1d was admitted in Active Labor on 05/24/2017. Patient had an uncomplicated labor course as follows:  Membrane Rupture Time/Date: 2:00 AM ,05/24/2017   Intrapartum Procedures: Episiotomy: None [1]                                         Lacerations:  None [1]  Patient had a delivery of a Viable infant. 05/24/2017  Information for the patient's newborn:  Monica Holloway, Girl Juliette [[id number]][[id number]]  Delivery Method: VBAC, Spontaneous(Filed from Delivery Summary)    Pateint had an uncomplicated postpartum course.  She is ambulating, tolerating a regular diet, passing flatus, and urinating well. Patient is discharged home in stable condition on 05/26/17.   Physical exam  Vitals:   05/25/17 0543 05/26/17 0548  BP: 114/65 106/67  Pulse: 83 85  Resp: 16 14  Temp: 98.5 F (36.9 C)   SpO2:       General: alert, cooperative and no distress Lochia: appropriate Uterine Fundus: firm Incision: N/A DVT Evaluation: No evidence of DVT seen on physical exam.  Labs: No results found for this or any previous visit (from the past 24 hour(s)).   Discharge instruction: per After Visit Summary and "Baby and Me Booklet".  After visit meds:  No Known Allergies  Allergies as of 05/26/2017   No Known Allergies     Medication List    STOP taking these medications   nitrofurantoin (macrocrystal-monohydrate) 100 MG capsule Commonly known as:  MACROBID     TAKE these medications   ibuprofen 600 MG tablet Commonly known as:  ADVIL,MOTRIN Take 1 tablet (600 mg total) by mouth every 6 (six) hours.   PRENATAL PO Take 1 tablet daily by mouth.        Diet: routine diet  Activity: Advance as tolerated. Pelvic rest for 6 weeks.   Outpatient follow up:2 weeks Future Appointments: No future  appointments.  Follow up Appt: No Follow-up on file.     Postpartum contraception: Depo Provera  Newborn Data: APGAR (1 MIN): 8   APGAR (5 MINS): 9     Baby Feeding: Bottle Disposition:home with paternal grandmother  Rolm Bookbindermber Elven Laboy, DO  05/26/2017

## 2017-05-26 NOTE — Progress Notes (Signed)
CSW spoke with assigned CPS worker J. Perry ([phone]) via telephone regarding infant's discharge plan.  CSW was informed that MOB and infant with discharge with MOB's support person (Juanita Rorie; [address] [place], [place] [address]; [phone]).  MOB and Ms. Rorie is aware that infant can only discharged if Ms. Rorie is present.  Ms Rorie will also need to provide a government issued ID (CN please make a copy and place in baby's chart).  CPS will also fax over a letter on DHHS letterhead explaining safety d/c plan for infant.   Monica Holloway, MSW, LCSW Clinical Social Work [phone]  

## 2017-05-30 ENCOUNTER — Inpatient Hospital Stay (HOSPITAL_COMMUNITY): Payer: Medicaid Other

## 2017-07-06 ENCOUNTER — Ambulatory Visit: Payer: Self-pay | Admitting: Student

## 2017-12-15 ENCOUNTER — Encounter (HOSPITAL_COMMUNITY): Payer: Self-pay | Admitting: Emergency Medicine

## 2017-12-15 ENCOUNTER — Other Ambulatory Visit: Payer: Self-pay

## 2017-12-15 ENCOUNTER — Emergency Department (HOSPITAL_COMMUNITY)
Admission: EM | Admit: 2017-12-15 | Discharge: 2017-12-15 | Disposition: A | Discharge status: Home / Self Care | Payer: Medicaid Other | Attending: Emergency Medicine | Admitting: Emergency Medicine

## 2017-12-15 DIAGNOSIS — T7621XA Adult sexual abuse, suspected, initial encounter: Secondary | ICD-10-CM | POA: Insufficient documentation

## 2017-12-15 DIAGNOSIS — T7421XA Adult sexual abuse, confirmed, initial encounter: Secondary | ICD-10-CM

## 2017-12-15 DIAGNOSIS — Z79899 Other long term (current) drug therapy: Secondary | ICD-10-CM | POA: Insufficient documentation

## 2017-12-15 DIAGNOSIS — F17210 Nicotine dependence, cigarettes, uncomplicated: Secondary | ICD-10-CM | POA: Insufficient documentation

## 2017-12-15 DIAGNOSIS — Z0441 Encounter for examination and observation following alleged adult rape: Secondary | ICD-10-CM | POA: Diagnosis present

## 2017-12-15 DIAGNOSIS — J45909 Unspecified asthma, uncomplicated: Secondary | ICD-10-CM | POA: Insufficient documentation

## 2017-12-15 LAB — COMPREHENSIVE METABOLIC PANEL
ALBUMIN: 4.1 g/dL (ref 3.5–5.0)
ALK PHOS: 70 U/L (ref 38–126)
ALT: 50 U/L (ref 14–54)
ANION GAP: 12 (ref 5–15)
AST: 65 U/L — ABNORMAL HIGH (ref 15–41)
BUN: 10 mg/dL (ref 6–20)
CALCIUM: 9.1 mg/dL (ref 8.9–10.3)
CHLORIDE: 103 mmol/L (ref 101–111)
CO2: 21 mmol/L — AB (ref 22–32)
Creatinine, Ser: 0.83 mg/dL (ref 0.44–1.00)
GFR calc Af Amer: >60 mL/min (ref >60)
GFR calc non Af Amer: >60 mL/min (ref >60)
Glucose, Bld: 120 mg/dL — ABNORMAL HIGH (ref 65–99)
POTASSIUM: 4 mmol/L (ref 3.5–5.1)
SODIUM: 136 mmol/L (ref 135–145)
Total Bilirubin: 0.5 mg/dL (ref 0.3–1.2)
Total Protein: 7.7 g/dL (ref 6.5–8.1)

## 2017-12-15 LAB — RAPID HIV SCREEN (HIV 1/2 AB+AG)
HIV 1/2 Antibodies: NONREACTIVE
HIV-1 P24 ANTIGEN - HIV24: NONREACTIVE

## 2017-12-15 MED ORDER — ACETAMINOPHEN 500 MG PO TABS
1000.0000 mg | ORAL_TABLET | Freq: Once | ORAL | Status: AC
  Administered 2017-12-15: 1000 mg via ORAL
  Filled 2017-12-15: qty 2

## 2017-12-15 MED ORDER — HEPATITIS B VAC RECOMBINANT 10 MCG/ML IJ SUSP
1.0000 mL | Freq: Once | INTRAMUSCULAR | Status: DC
  Filled 2017-12-15 (×2): qty 1

## 2017-12-15 MED ORDER — ELVITEG-COBIC-EMTRICIT-TENOFAF 150-150-200-10 MG PO TABS
1.0000 | ORAL_TABLET | Freq: Every day | ORAL | Status: AC
  Administered 2017-12-15: 1 via ORAL
  Filled 2017-12-15: qty 1

## 2017-12-15 MED ORDER — HEPATITIS B VAC RECOMBINANT 10 MCG/0.5ML IJ SUSP
1.0000 mL | Freq: Once | INTRAMUSCULAR | Status: AC
  Administered 2017-12-15: 1 mL via INTRAMUSCULAR
  Filled 2017-12-15 (×2): qty 1

## 2017-12-15 MED ORDER — AZITHROMYCIN 250 MG PO TABS
1000.0000 mg | ORAL_TABLET | Freq: Once | ORAL | Status: AC
  Administered 2017-12-15: 1000 mg via ORAL
  Filled 2017-12-15: qty 4

## 2017-12-15 MED ORDER — ONDANSETRON HCL 4 MG PO TABS
4.0000 mg | ORAL_TABLET | Freq: Once | ORAL | Status: AC
  Administered 2017-12-15: 4 mg via ORAL
  Filled 2017-12-15: qty 1

## 2017-12-15 MED ORDER — ELVITEG-COBIC-EMTRICIT-TENOFAF 150-150-200-10 MG PO TABS: 1.0000 | ORAL_TABLET | Freq: Every day | ORAL | 0 refills | Status: AC

## 2017-12-15 MED ORDER — METRONIDAZOLE 500 MG PO TABS
2000.0000 mg | ORAL_TABLET | Freq: Once | ORAL | Status: AC
  Administered 2017-12-15: 2000 mg via ORAL
  Filled 2017-12-15: qty 4

## 2017-12-15 MED ORDER — CEFTRIAXONE SODIUM 250 MG IJ SOLR
250.0000 mg | Freq: Once | INTRAMUSCULAR | Status: AC
  Administered 2017-12-15: 250 mg via INTRAMUSCULAR
  Filled 2017-12-15: qty 250

## 2017-12-15 MED ORDER — PROMETHAZINE HCL 25 MG PO TABS
25.0000 mg | ORAL_TABLET | Freq: Four times a day (QID) | ORAL | Status: DC | PRN
  Administered 2017-12-15: 25 mg via ORAL

## 2017-12-15 MED ORDER — LIDOCAINE HCL (PF) 1 % IJ SOLN
0.9000 mL | Freq: Once | INTRAMUSCULAR | Status: AC
  Administered 2017-12-15: 0.9 mL
  Filled 2017-12-15: qty 5

## 2017-12-15 MED ORDER — ELVITEG-COBIC-EMTRICIT-TENOFAF 150-150-200-10 MG PO TABS
1.0000 | ORAL_TABLET | Freq: Every day | ORAL | Status: DC
  Filled 2017-12-15: qty 5
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
## 2017-12-15 NOTE — Consult Note (Signed)
  [employer] [facility] information [facility] [facility]:  BIN# [id number] PCN# [id number] Group# [id number] Member# [id number]

## 2017-12-15 NOTE — ED Notes (Signed)
Pt is in consultation A, currently speaking to [employer].
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
## 2017-12-15 NOTE — Discharge Instructions (Signed)
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 dextromethorphan; quinidine -thioridazine -ziprasidone This medicine may also interact with the following medications: -certain medicines for depression, anxiety, or psychotic disturbances -certain medicines for anxiety or sleep -certain medicines for seizures like carbamazepine, phenobarbital, phenytoin -certain medicines for movement abnormalities as in Parkinson's disease, or for gastrointestinal problems -epinephrine -medicines  for allergies or colds -muscle relaxants -narcotic medicines for pain -other medicines that prolong the QT interval (cause an abnormal heart rhythm) -tramadol -trimethobenzamide This list may not describe all possible interactions. Give your health care provider a list of all the medicines, herbs, non-prescription drugs, or dietary supplements you use. Also  tell them if you smoke, drink alcohol, or use illegal drugs. Some items may interact with your medicine. What should I watch for while using this medicine? Tell your doctor or health care professional if your symptoms do not start to get better in 1 to 2 days. You may get drowsy or dizzy. Do not drive, use machinery, or do anything that needs mental alertness until you know how this medicine affects you. To reduce the risk of dizzy or fainting spells, do not stand or sit up quickly, especially if you are an older patient. Alcohol may increase dizziness and drowsiness. Avoid alcoholic drinks. Your mouth may get dry. Chewing sugarless gum or sucking hard candy, and drinking plenty of water may help. Contact your doctor if the problem does not go away or is severe. This medicine may cause dry eyes and blurred vision. If you wear contact lenses you may feel some discomfort. Lubricating drops may help. See your eye doctor if the problem does not go away or is severe. This medicine can make you more sensitive to the sun. Keep out of the sun. If you cannot avoid being in the sun, wear protective clothing and use sunscreen. Do not use sun lamps or tanning beds/booths. If you are diabetic, check your blood-sugar levels regularly. What side effects may I notice from receiving this medicine? Side effects that you should report to your doctor or health care professional as soon as possible: -blurred vision -irregular heartbeat, palpitations or chest pain -muscle or facial twitches -pain or difficulty passing urine -seizures -skin rash -slowed or shallow breathing -unusual bleeding or bruising -yellowing of the eyes or skin Side effects that usually do not require medical attention (report to your doctor or health care professional if they continue or are bothersome): -headache -nightmares, agitation, nervousness, excitability, not able to sleep (these are more likely in children) -stuffy nose This list  may not describe all possible side effects. Call your doctor for medical advice about side effects. You may report side effects to FDA at 1-800-FDA-1088. Where should I keep my medicine? Keep out of the reach of children. Store at room temperature, between 20 and 25 degrees C (68 and 77 degrees F). Protect from light. Throw away any unused medicine after the expiration date. NOTE: This sheet is a summary. It may not cover all possible information. If you have questions about this medicine, talk to your doctor, pharmacist, or health care provider.  2017 Elsevier/Gold Standard (2013-02-12 15:04:46)   Ceftriaxone (Injection/Shot) Also known as:  Rocephin  Ceftriaxone injection What is this medicine? CEFTRIAXONE (sef try AX one) is a cephalosporin antibiotic. It is used to treat certain kinds of bacterial infections. It will not work for colds, flu, or other viral infections. This medicine may be used for other purposes; ask your health care provider or pharmacist if you have questions. COMMON BRAND NAME(S): Rocephin What should I tell my health care provider before I take this medicine? They need to know if you have any of these conditions: -any chronic illness -bowel disease, like colitis -both kidney and liver disease -high bilirubin level in newborn patients -an unusual or allergic reaction to ceftriaxone, other cephalosporin or penicillin  antibiotics, foods, dyes, or preservatives -pregnant or trying to get pregnant -breast-feeding How should I use this medicine? This medicine is injected into a muscle or infused it into a vein. It is usually given in a medical office or clinic. If you are to give this medicine you will be taught how to inject it. Follow instructions carefully. Use your doses at regular intervals. Do not take your medicine more often than directed. Do not skip doses or stop your medicine early even if you feel better. Do not stop taking except on your doctor's  advice. Talk to your pediatrician regarding the use of this medicine in children. Special care may be needed. Overdosage: If you think you have taken too much of this medicine contact a poison control center or emergency room at once. NOTE: This medicine is only for you. Do not share this medicine with others. What if I miss a dose? If you miss a dose, take it as soon as you can. If it is almost time for your next dose, take only that dose. Do not take double or extra doses. What may interact with this medicine? Do not take this medicine with any of the following medications: -intravenous calcium This medicine may also interact with the following medications: -birth control pills This list may not describe all possible interactions. Give your health care provider a list of all the medicines, herbs, non-prescription drugs, or dietary supplements you use. Also tell them if you smoke, drink alcohol, or use illegal drugs. Some items may interact with your medicine. What should I watch for while using this medicine? Tell your doctor or health care professional if your symptoms do not improve or if they get worse. Do not treat diarrhea with over the counter products. Contact your doctor if you have diarrhea that lasts more than 2 days or if it is severe and watery. If you are being treated for a sexually transmitted disease, avoid sexual contact until you have finished your treatment. Having sex can infect your sexual partner. Calcium may bind to this medicine and cause lung or kidney problems. Avoid calcium products while taking this medicine and for 48 hours after taking the last dose of this medicine. What side effects may I notice from receiving this medicine? Side effects that you should report to your doctor or health care professional as soon as possible: -allergic reactions like skin rash, itching or hives, swelling of the face, lips, or tongue -breathing problems -fever, chills -irregular  heartbeat -pain when passing urine -seizures -stomach pain, cramps -unusual bleeding, bruising -unusually weak or tired Side effects that usually do not require medical attention (report to your doctor or health care professional if they continue or are bothersome): -diarrhea -dizzy, drowsy -headache -nausea, vomiting -pain, swelling, irritation where injected -stomach upset -sweating This list may not describe all possible side effects. Call your doctor for medical advice about side effects. You may report side effects to FDA at 1-800-FDA-1088. Where should I keep my medicine? Keep out of the reach of children. Store at room temperature below 25 degrees C (77 degrees F). Protect from light. Throw away any unused vials after the expiration date. NOTE: This sheet is a summary. It may not cover all possible information. If you have questions about this medicine, talk to your doctor, pharmacist, or health care provider.  2017 Elsevier/Gold Standard (2013-12-30 09:14:54)      Cobicistat; Elvitegravir; Emtricitabine; Tenofovir Alafenamide oral tablets   What is this medicine? COBICISTAT; ELVITEGRAVIR; EMTRICITABINE; TENOFOVIR  ALAFENAMIDE (koe BIS i stat; el vye TEG ra veer; em tri SIT uh bean; te NOE fo veer) is three antiretroviral medicines and a medication booster in one tablet. It is used to treat HIV. This medicine is not a cure for HIV. It will not stop the spread of HIV to others. This medicine may be used for other purposes; ask your health care provider or pharmacist if you have questions. COMMON BRAND NAME(S): Genvoya  What should I tell my health care provider before I take this medicine? They need to know if you have any of these conditions: -kidney disease -liver disease -an unusual or allergic reaction to cobicistat, elvitegravir, emtricitabine, tenofovir, other medicines, foods, dyes, or preservatives -pregnant or trying to get pregnant -breast-feeding  How should  I use this medicine? Take this medicine by mouth with a glass of water. Follow the directions on the prescription label. Take this medicine with food. Take your medicine at regular intervals. Do not take your medicine more often than directed. For your anti-HIV therapy to work as well as possible, take each dose exactly as prescribed. Do not skip doses or stop your medicine even if you feel better. Skipping doses may make the HIV virus resistant to this medicine and other medicines. Do not stop taking except on your doctor's advice. Talk to your pediatrician regarding the use of this medicine in children. While this drug may be prescribed for selected conditions, precautions do apply. Overdosage: If you think you have taken too much of this medicine contact a poison control center or emergency room at once. NOTE: This medicine is only for you. Do not share this medicine with others.  What if I miss a dose? If you miss a dose, take it as soon as you can. If it is almost time for your next dose, take only that dose. Do not take double or extra doses.  What may interact with this medicine? Do not take this medicine with any of the following medications: -adefovir -alfuzosin -certain medicines for seizures like carbamazepine, phenobarbital, phenytoin -cisapride -lumacaftor; ivacaftor -lurasidone -medicines for cholesterol like lovastatin, simvastatin -medicines for headaches like dihydroergotamine, ergotamine, methylergonovine -midazolam -other antiviral medicines for HIV or AIDS -pimozide -rifampin -sildenafil -St. John's wort -triazolam This medicine may also interact with the following medications: -antacids -atorvastatin -bosentan -buprenorphine; naloxone -certain antibiotics like clarithromycin, telithromycin, rifabutin, rifapentine -certain medications for anxiety or sleep like buspirone, clorazepate, diazepam, estazolam, flurazepam, zolpidem -certain medicines for blood pressure  or heart disease like amlodipine, diltiazem, felodipine, metoprolol, nicardipine, nifedipine, timolol, verapamil -certain medicines for depression, anxiety, or psychiatric disturbances -certain medicines for erectile dysfunction like avanafil, sildenafil, tadalafil, vardenafil -certain medicines for fungal infection like itraconazole, ketoconazole, voriconazole -colchicine -cyclosporine -dexamethasone -female hormones, like estrogens and progestins and birth control pills -fluticasone -medicines for infection like acyclovir, cidofovir, valacyclovir, ganciclovir, valganciclovir -medicines for irregular heart beat like amiodarone, bepridil, digoxin, disopyramide, dofetilide, flecainide, lidocaine, mexiletine, propafenone, quinidine -metformin -oxcarbazepine -phenothiazines like perphenazine, risperidone, thioridazine -salmeterol -sirolimus -tacrolimus -warfarin This list may not describe all possible interactions. Give your health care provider a list of all the medicines, herbs, non-prescription drugs, or dietary supplements you use. Also tell them if you smoke, drink alcohol, or use illegal drugs. Some items may interact with your medicine.  What should I watch for while using this medicine? Visit your doctor or health care professional for regular check ups. Discuss any new symptoms with your doctor. You will need to have important blood work done while on this  medicine. HIV is spread to others through sexual or blood contact. Talk to your doctor about how to stop the spread of HIV. If you have hepatitis B, talk to your doctor if you plan to stop this medicine. The symptoms of hepatitis B may get worse if you stop this medicine. Birth control pills may not work properly while you are taking this medicine. Talk to your doctor about using an extra method of birth control. Women who can still have children must use a reliable form of barrier contraception, like a condom.  What side effects  may I notice from receiving this medicine? Side effects that you should report to your doctor or health care professional as soon as possible: -allergic reactions like skin rash, itching or hives, swelling of the face, lips, or tongue -breathing problems -fast, irregular heartbeat -muscle pain or weakness -signs and symptoms of kidney injury like trouble passing urine or change in the amount of urine -signs and symptoms of liver injury like dark yellow or brown urine; general ill feeling or flu-like symptoms; light-colored stools; loss of appetite; right upper belly pain; unusually weak or tired; yellowing of the eyes or skin Side effects that usually do not require medical attention (report to your doctor or health care professional if they continue or are bothersome): -diarrhea -headache -nausea -tiredness This list may not describe all possible side effects. Call your doctor for medical advice about side effects. You may report side effects to FDA at 1-800-FDA-1088.  Where should I keep my medicine? Keep out of the reach of children. Store at room temperature below 30 degrees C (86 degrees F). Throw away any unused medicine after the expiration date. NOTE: This sheet is a summary. It may not cover all possible information. If you have questions about this medicine, talk to your doctor, pharmacist, or health care provider.  2018 Elsevier/Gold Standard (2016-03-28 12:54:04)

## 2017-12-15 NOTE — ED Notes (Signed)
Pt in hallway talking to another patient

## 2017-12-15 NOTE — ED Notes (Signed)
Genvoya tablet received from pharmacy, sent with [facility] nurse upstairs.

## 2017-12-16 ENCOUNTER — Emergency Department (HOSPITAL_COMMUNITY)
Admission: EM | Admit: 2017-12-16 | Discharge: 2017-12-16 | Disposition: A | Discharge status: Home / Self Care | Payer: Medicaid Other | Attending: Emergency Medicine | Admitting: Emergency Medicine

## 2017-12-16 ENCOUNTER — Ambulatory Visit (HOSPITAL_COMMUNITY)
Admission: EM | Admit: 2017-12-16 | Discharge: 2017-12-16 | Disposition: A | Discharge status: Home / Self Care | Payer: No Typology Code available for payment source | Attending: Emergency Medicine | Admitting: Emergency Medicine

## 2017-12-16 ENCOUNTER — Other Ambulatory Visit: Payer: Self-pay

## 2017-12-16 ENCOUNTER — Encounter (HOSPITAL_COMMUNITY): Payer: Self-pay | Admitting: Emergency Medicine

## 2017-12-16 DIAGNOSIS — J45909 Unspecified asthma, uncomplicated: Secondary | ICD-10-CM | POA: Insufficient documentation

## 2017-12-16 DIAGNOSIS — Z79899 Other long term (current) drug therapy: Secondary | ICD-10-CM | POA: Insufficient documentation

## 2017-12-16 DIAGNOSIS — Z0441 Encounter for examination and observation following alleged adult rape: Secondary | ICD-10-CM | POA: Insufficient documentation

## 2017-12-16 DIAGNOSIS — F17210 Nicotine dependence, cigarettes, uncomplicated: Secondary | ICD-10-CM | POA: Insufficient documentation

## 2017-12-16 DIAGNOSIS — T7421XA Adult sexual abuse, confirmed, initial encounter: Secondary | ICD-10-CM | POA: Insufficient documentation

## 2017-12-16 LAB — URINALYSIS, ROUTINE W REFLEX MICROSCOPIC
BACTERIA UA: NONE SEEN
Bilirubin Urine: NEGATIVE
Glucose, UA: NEGATIVE mg/dL
HGB URINE DIPSTICK: NEGATIVE
KETONES UR: 5 mg/dL — AB
NITRITE: NEGATIVE
PROTEIN: NEGATIVE mg/dL
Specific Gravity, Urine: 1.013 (ref 1.005–1.030)
pH: 5 (ref 5.0–8.0)

## 2017-12-16 LAB — COMPREHENSIVE METABOLIC PANEL
ALBUMIN: 4.3 g/dL (ref 3.5–5.0)
ALK PHOS: 75 U/L (ref 38–126)
ALT: 48 U/L (ref 14–54)
ANION GAP: 10 (ref 5–15)
AST: 49 U/L — ABNORMAL HIGH (ref 15–41)
BUN: 11 mg/dL (ref 6–20)
CALCIUM: 9.6 mg/dL (ref 8.9–10.3)
CO2: 23 mmol/L (ref 22–32)
Chloride: 105 mmol/L (ref 101–111)
Creatinine, Ser: 1.09 mg/dL — ABNORMAL HIGH (ref 0.44–1.00)
GFR calc non Af Amer: >60 mL/min (ref >60)
Glucose, Bld: 89 mg/dL (ref 65–99)
POTASSIUM: 3.7 mmol/L (ref 3.5–5.1)
SODIUM: 138 mmol/L (ref 135–145)
Total Bilirubin: 1.3 mg/dL — ABNORMAL HIGH (ref 0.3–1.2)
Total Protein: 8 g/dL (ref 6.5–8.1)

## 2017-12-16 LAB — I-STAT BETA HCG BLOOD, ED (MC, WL, AP ONLY): I-stat hCG, quantitative: <5 m[IU]/mL (ref <5)

## 2017-12-16 LAB — CBC
HEMATOCRIT: 44.7 % (ref 36.0–46.0)
HEMOGLOBIN: 14.6 g/dL (ref 12.0–15.0)
MCH: 32.2 pg (ref 26.0–34.0)
MCHC: 32.7 g/dL (ref 30.0–36.0)
MCV: 98.5 fL (ref 78.0–100.0)
Platelets: 293 10*3/uL (ref 150–400)
RBC: 4.54 MIL/uL (ref 3.87–5.11)
RDW: 12.6 % (ref 11.5–15.5)
WBC: 5.5 10*3/uL (ref 4.0–10.5)

## 2017-12-16 LAB — HEPATITIS B SURFACE ANTIGEN: Hepatitis B Surface Ag: NEGATIVE

## 2017-12-16 LAB — LIPASE, BLOOD: LIPASE: 40 U/L (ref 11–51)

## 2017-12-16 LAB — HEPATITIS C ANTIBODY: HCV Ab: <0.1 s/co ratio (ref 0.0–0.9)

## 2017-12-16 LAB — RPR: RPR: NONREACTIVE

## 2017-12-16 MED ORDER — ONDANSETRON 4 MG PO TBDP
4.0000 mg | ORAL_TABLET | Freq: Once | ORAL | Status: AC
  Administered 2017-12-16: 4 mg via ORAL
  Filled 2017-12-16: qty 1

## 2017-12-16 NOTE — ED Notes (Signed)
Declined W/C at D/C and was escorted to lobby by RN. 

## 2017-12-16 NOTE — ED Triage Notes (Signed)
Pt has now talked with her Father and is calling a Cousin  For a place to go to.
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
## 2017-12-16 NOTE — ED Triage Notes (Signed)
Pt went to front lobby waiting for a call with an address to go home to. Nurse first knows to call CN if Pt needs a cab voucher for ride home. Pt instructed to tell Nurse first if a Cab voucher is needed.
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
## 2017-12-16 NOTE — ED Triage Notes (Signed)
Pt walking in and out of POD-F to front lobby to get snacks and drinks . Pt is ambulatory and in no acute distress.

## 2017-12-16 NOTE — Consult Note (Signed)
Called [facility] for assistance with placement. They report there are no available beds in [place]. Suggested trying [facility] in [place] at [phone]. Attempted to contact [facility] and got voicemail. I left a message for them to call [facility] ED Pod C for help with placement. Patient's RN and MD are aware and social work consult is active.
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
## 2017-12-16 NOTE — ED Triage Notes (Signed)
Pt reports calling a friend [place] at [phone] . This friend will come pick up Pt and take her home.
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
## 2017-12-16 NOTE — Consult Note (Signed)
18:00- SANE consult has been completed. Patient declining to remain for kit collection today. She states she will return tomorrow morning. Primary or charge RN and physician have been notified. Please contact the SANE nurse on call (listed in [place]) with any further concerns.

## 2017-12-16 NOTE — ED Notes (Signed)
Pt cleared for Sane

## 2017-12-16 NOTE — Consult Note (Signed)
SANE consult has been completed. Primary or charge RN and physician have been notified. Please contact the SANE nurse on call (listed in [place]) with any further concerns.  

## 2017-12-18 LAB — POC URINE PREG, ED: Preg Test, Ur: NEGATIVE
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
## 2017-12-18 NOTE — Consult Note (Signed)
12/18/17 @ 1450- attempted to call patient to remind her to pick [facility]. No answer, voicemail full.

## 2017-12-18 NOTE — SANE Note (Signed)
On 12/18/17 @ 0953- Called mobile and home number listed in chart to remind Monica Holloway to pick up her Genvoya [facility] at [facility]. No answer at either number.

## 2017-12-19 NOTE — Consult Note (Signed)
Advised by Meriel PicaMelissa Miller RN, FNE that Ms. Monica RisingMcNeil has returned to [facility] ED with a request to complete SAECK. I advised Melissa that I would be en route.
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
## 2017-12-19 NOTE — Consult Note (Signed)
SAECK locked in cabinet in [facility] due to need to drive to [facility] to see a patient. Key to cabinet kept on my person.

## 2018-03-28 ENCOUNTER — Emergency Department (HOSPITAL_COMMUNITY)
Admission: EM | Admit: 2018-03-28 | Discharge: 2018-03-28 | Disposition: A | Discharge status: Home / Self Care | Payer: Self-pay | Attending: Emergency Medicine | Admitting: Emergency Medicine

## 2018-03-28 ENCOUNTER — Encounter (HOSPITAL_COMMUNITY): Payer: Self-pay

## 2018-03-28 ENCOUNTER — Emergency Department (HOSPITAL_COMMUNITY): Payer: Self-pay

## 2018-03-28 DIAGNOSIS — S82832D Other fracture of upper and lower end of left fibula, subsequent encounter for closed fracture with routine healing: Secondary | ICD-10-CM | POA: Insufficient documentation

## 2018-03-28 DIAGNOSIS — J45909 Unspecified asthma, uncomplicated: Secondary | ICD-10-CM | POA: Insufficient documentation

## 2018-03-28 DIAGNOSIS — W228XXA Striking against or struck by other objects, initial encounter: Secondary | ICD-10-CM | POA: Insufficient documentation

## 2018-03-28 DIAGNOSIS — S82892A Other fracture of left lower leg, initial encounter for closed fracture: Secondary | ICD-10-CM

## 2018-03-28 DIAGNOSIS — F17210 Nicotine dependence, cigarettes, uncomplicated: Secondary | ICD-10-CM | POA: Insufficient documentation

## 2018-03-28 DIAGNOSIS — S82302D Unspecified fracture of lower end of left tibia, subsequent encounter for closed fracture with routine healing: Secondary | ICD-10-CM | POA: Insufficient documentation

## 2018-03-28 DIAGNOSIS — S82832A Other fracture of upper and lower end of left fibula, initial encounter for closed fracture: Secondary | ICD-10-CM

## 2018-03-28 DIAGNOSIS — S82302A Unspecified fracture of lower end of left tibia, initial encounter for closed fracture: Secondary | ICD-10-CM

## 2018-03-28 DIAGNOSIS — Z79899 Other long term (current) drug therapy: Secondary | ICD-10-CM | POA: Insufficient documentation

## 2018-03-28 DIAGNOSIS — R2242 Localized swelling, mass and lump, left lower limb: Secondary | ICD-10-CM | POA: Insufficient documentation

## 2018-03-28 MED ORDER — IBUPROFEN 800 MG PO TABS
800.0000 mg | ORAL_TABLET | Freq: Once | ORAL | Status: AC
  Administered 2018-03-28: 800 mg via ORAL
  Filled 2018-03-28: qty 1
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
## 2018-03-28 NOTE — Discharge Instructions (Signed)
Cast or Splint Care, Adult Casts and splints are supports that are worn to protect broken bones and other injuries. A cast or splint may hold a bone still and in the correct position while it heals. Casts and splints may also help ease pain, swelling, and muscle spasms. A cast is a hardened support that is usually made of fiberglass or plaster. It is custom-fit to the body and it offers more protection than a splint. It cannot be taken off and put back on. A splint is a type of soft support that is usually made from cloth and elastic. It can be adjusted or taken off as needed. You may need a cast or a splint if you:  Have a broken bone.  Have a soft-tissue injury.  Need to keep an injured body part from moving (keep it immobile) after surgery.  How is this treated? If you have a cast:  Do not stick anything inside the cast to scratch your skin. Sticking something in the cast increases your risk of infection.  Check the skin around the cast every day. Tell your health care provider about any concerns.  You may put lotion on dry skin around the edges of the cast. Do not put lotion on the skin underneath the cast.  Keep the cast clean.  If the cast is not waterproof: ? Do not let it get wet. ? Cover it with a watertight covering when you take a bath or a shower. If you have a splint:  Wear it as told by your health care provider. Remove it only as told by your health care provider.  Loosen the splint if your fingers or toes tingle, become numb, or turn cold and blue.  Keep the splint clean.  If the splint is not waterproof: ? Do not let it get wet. ? Cover it with a watertight covering when you take a bath or a shower. Bathing  Do not take baths or swim until your health care provider approves. Ask your health care provider if you can take showers. You may only be allowed to take sponge baths for bathing.  If your cast or splint is not waterproof, cover it with a watertight  covering when you take a bath or shower. Managing pain, stiffness, and swelling  Move your fingers or toes often to avoid stiffness and to lessen swelling.  Raise (elevate) the injured area above the level of your heart while sitting or lying down. Safety  Do not use the injured limb to support your body weight until your health care provider says that it is okay.  Use crutches or other assistive devices as told by your health care provider. General instructions  Do not put pressure on any part of the cast or splint until it is fully hardened. This may take several hours.  Return to your normal activities as told by your health care provider. Ask your health care provider what activities are safe for you.  Take over-the-counter and prescription medicines only as told by your health care provider.  Keep all follow-up visits as told by your health care provider. This is important. Contact a health care provider if:  Your cast or splint gets damaged.  The skin around the cast gets red or raw.  The skin under the cast is extremely itchy or painful.  Your cast or splint feels very uncomfortable.  Your cast or splint is too tight or too loose.  Your cast becomes wet or it develops a  soft spot or area.  You get an object stuck under your cast. Get help right away if:  Your pain is getting worse.  The injured area tingles, becomes numb, or turns cold and blue.  The part of your body above or below the cast is swollen and discolored.  You cannot feel or move your fingers or toes.  There is fluid leaking through the cast.  You have severe pain or pressure under the cast.  You have trouble breathing.  You have shortness of breath.  You have chest pain. This information is not intended to replace advice given to you by your health care provider. Make sure you discuss any questions you have with your health care provider. Document Released: 06/10/2000 Document Revised:  01/02/2016 Document Reviewed: 12/05/2015 Elsevier Interactive Patient Education  2018 [employer].   Keep splint intact and dry.  Do not put weight down on your left leg.  Keep foot elevated whenever possible.  Ice to ankle (over splint) for 30 minutes 4x/day through Friday.
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
## 2018-03-28 NOTE — Progress Notes (Signed)
Orthopedic Tech Progress Note Patient Details:  Monica Holloway October 03, 1985 [id number]  Ortho Devices Type of Ortho Device: Ace wrap, Crutches, Post (short leg) splint, Stirrup splint Ortho Device/Splint Location: lle Ortho Device/Splint Interventions: Application   Post Interventions Patient Tolerated: Well Instructions Provided: Care of device As ordered by PA Lynn Ito, Yomar Mejorado 03/28/2018, 11:53 AM
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
## 2018-03-28 NOTE — ED Triage Notes (Signed)
Pt refuses to change pants for placement splint to Lower Leg.
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
## 2018-04-07 ENCOUNTER — Other Ambulatory Visit: Payer: Self-pay

## 2018-04-07 ENCOUNTER — Emergency Department (HOSPITAL_COMMUNITY)
Admission: EM | Admit: 2018-04-07 | Discharge: 2018-04-08 | Disposition: A | Discharge status: Home / Self Care | Payer: Medicaid Other | Attending: Emergency Medicine | Admitting: Emergency Medicine

## 2018-04-07 ENCOUNTER — Emergency Department (HOSPITAL_COMMUNITY): Payer: Medicaid Other

## 2018-04-07 ENCOUNTER — Encounter (HOSPITAL_COMMUNITY): Payer: Self-pay | Admitting: Emergency Medicine

## 2018-04-07 DIAGNOSIS — S82845D Nondisplaced bimalleolar fracture of left lower leg, subsequent encounter for closed fracture with routine healing: Secondary | ICD-10-CM | POA: Insufficient documentation

## 2018-04-07 DIAGNOSIS — F17210 Nicotine dependence, cigarettes, uncomplicated: Secondary | ICD-10-CM | POA: Insufficient documentation

## 2018-04-07 DIAGNOSIS — S82102D Unspecified fracture of upper end of left tibia, subsequent encounter for closed fracture with routine healing: Secondary | ICD-10-CM

## 2018-04-07 DIAGNOSIS — Z79899 Other long term (current) drug therapy: Secondary | ICD-10-CM | POA: Insufficient documentation

## 2018-04-07 DIAGNOSIS — W19XXXD Unspecified fall, subsequent encounter: Secondary | ICD-10-CM | POA: Insufficient documentation

## 2018-04-07 DIAGNOSIS — J45909 Unspecified asthma, uncomplicated: Secondary | ICD-10-CM | POA: Insufficient documentation
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
## 2018-04-07 NOTE — Discharge Instructions (Addendum)
Evaluated for ankle pain.  Your x-ray did not show any new fracture to your previously fractured ankle.  We will write for for an Ace bandage and provided with crutches.  I have provided with resource guide for shelter free to stay at.  Follow-up with orthopedics for evaluation of your previously fractured ankle.  Return to the ED with any new or worsening symptoms.
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
## 2018-04-08 NOTE — ED Notes (Signed)
Offered for patient to sit in waiting room till in the room.
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
## 2018-04-09 ENCOUNTER — Emergency Department (HOSPITAL_COMMUNITY)
Admission: EM | Admit: 2018-04-09 | Discharge: 2018-04-09 | Disposition: A | Discharge status: Home / Self Care | Payer: Medicaid Other | Attending: Emergency Medicine | Admitting: Emergency Medicine

## 2018-04-09 ENCOUNTER — Encounter (HOSPITAL_COMMUNITY): Payer: Self-pay | Admitting: Emergency Medicine

## 2018-04-09 DIAGNOSIS — R42 Dizziness and giddiness: Secondary | ICD-10-CM | POA: Insufficient documentation

## 2018-04-09 DIAGNOSIS — J45909 Unspecified asthma, uncomplicated: Secondary | ICD-10-CM | POA: Insufficient documentation

## 2018-04-09 DIAGNOSIS — R5381 Other malaise: Secondary | ICD-10-CM | POA: Insufficient documentation

## 2018-04-09 DIAGNOSIS — F17210 Nicotine dependence, cigarettes, uncomplicated: Secondary | ICD-10-CM | POA: Insufficient documentation

## 2018-04-09 LAB — URINALYSIS, ROUTINE W REFLEX MICROSCOPIC
Bilirubin Urine: NEGATIVE
GLUCOSE, UA: NEGATIVE mg/dL
KETONES UR: NEGATIVE mg/dL
Nitrite: NEGATIVE
PROTEIN: NEGATIVE mg/dL
Specific Gravity, Urine: 1.017 (ref 1.005–1.030)
pH: 6 (ref 5.0–8.0)

## 2018-04-09 LAB — RAPID URINE DRUG SCREEN, HOSP PERFORMED
Amphetamines: NOT DETECTED
BARBITURATES: NOT DETECTED
BENZODIAZEPINES: NOT DETECTED
Cocaine: POSITIVE — AB
Opiates: NOT DETECTED
TETRAHYDROCANNABINOL: NOT DETECTED

## 2018-04-09 LAB — PREGNANCY, URINE: PREG TEST UR: NEGATIVE

## 2018-04-09 LAB — CBG MONITORING, ED: GLUCOSE-CAPILLARY: 73 mg/dL (ref 70–99)

## 2018-04-09 MED ORDER — ACETAMINOPHEN 325 MG PO TABS
650.0000 mg | ORAL_TABLET | Freq: Once | ORAL | Status: AC
  Administered 2018-04-09: 650 mg via ORAL
  Filled 2018-04-09: qty 2

## 2018-04-09 NOTE — ED Notes (Signed)
D/c reviewed with patient 
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
## 2018-04-09 NOTE — Discharge Instructions (Addendum)
You were evaluated in the Emergency Department and after careful evaluation, we did not find any emergent condition requiring admission or further testing in the hospital. ° °Please return to the Emergency Department if you experience any worsening of your condition.  We encourage you to follow up with a primary care provider.  Thank you for allowing us to be a part of your care. °

## 2018-04-23 ENCOUNTER — Encounter: Payer: Self-pay | Admitting: *Deleted
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
## 2018-11-29 IMAGING — CR DG CHEST 2V
2 series · 2 of 2 positions shown · non-contrast
Comparison: Chest radiograph 11/11/2015.

CLINICAL DATA: Patient with nonproductive cough.

EXAM:
CHEST  2 VIEW

[chest pa]
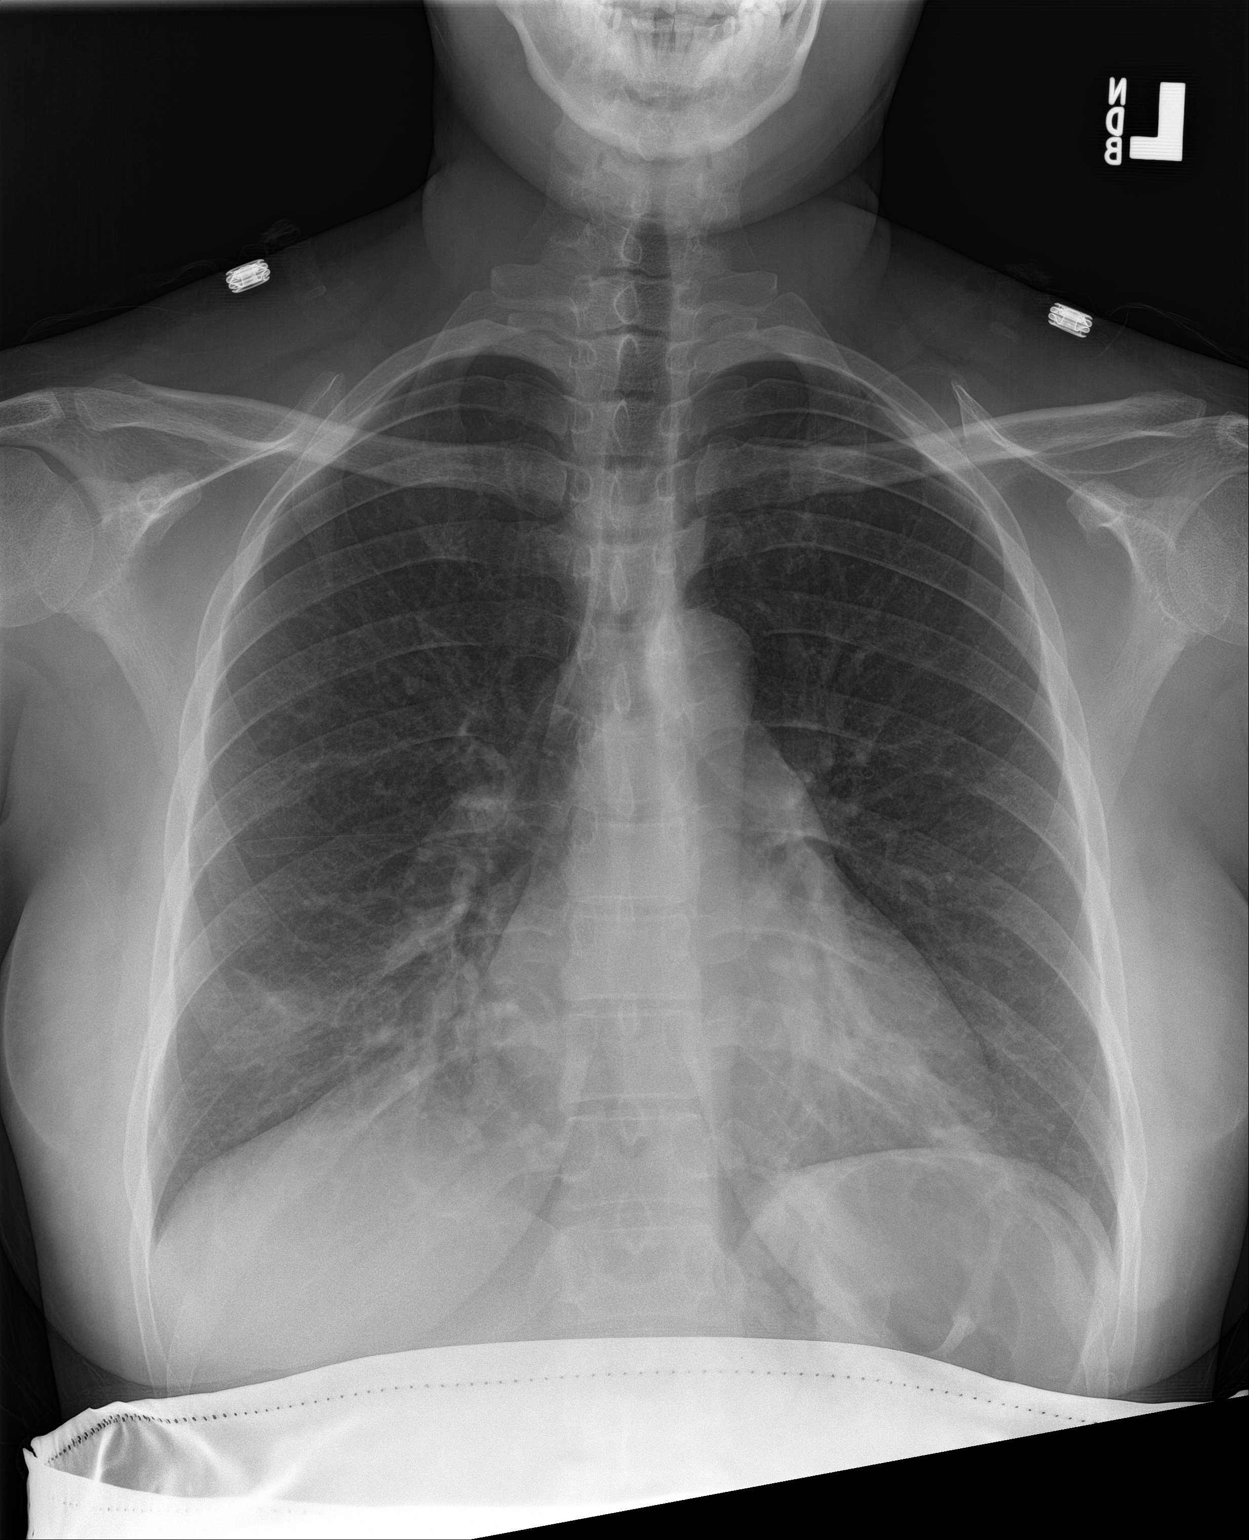

[chest lat]
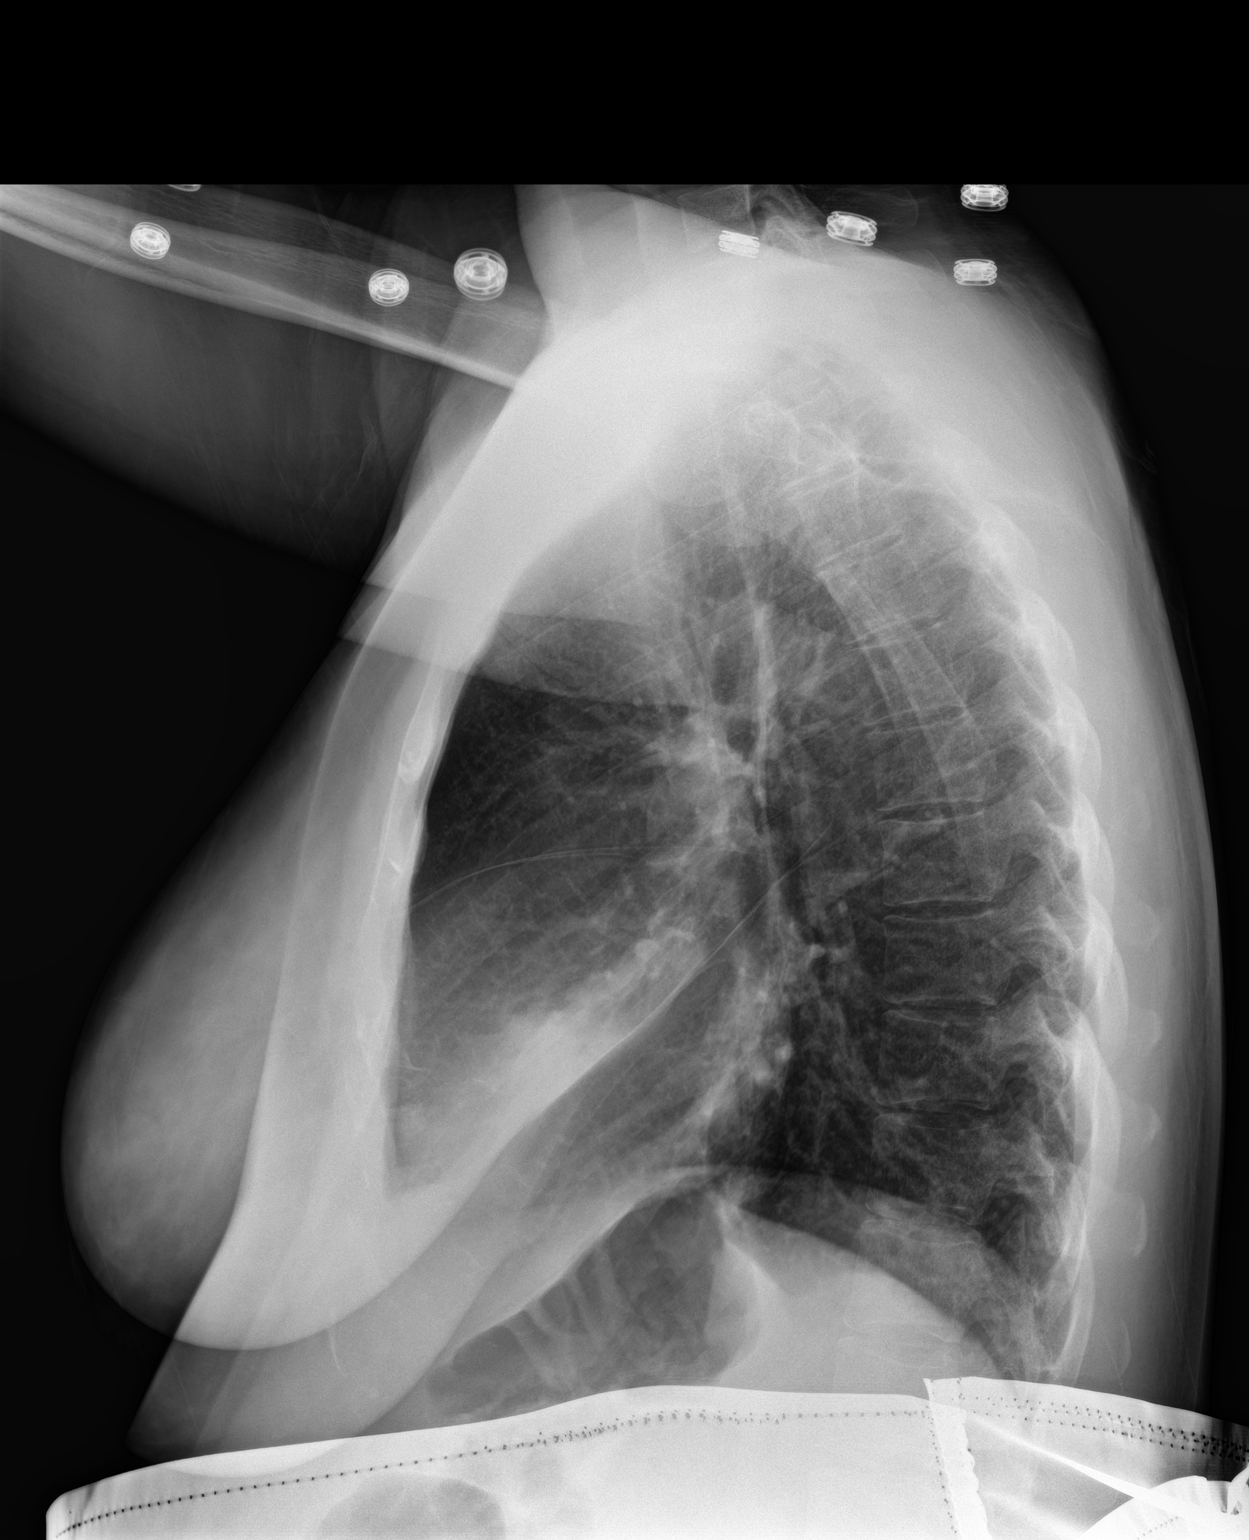

[2 of 2 positions shown; findings below may reference images not displayed]

FINDINGS: Cardiac contours upper limits of normal. Patchy consolidation within
the right middle lobe. Probable left basilar atelectasis. No pleural
effusion or pneumothorax. Regional skeleton is unremarkable.
IMPRESSION: Right middle lobe consolidation concerning for pneumonia in the
appropriate clinical setting.

## 2024-01-27 ENCOUNTER — Emergency Department (HOSPITAL_COMMUNITY)

## 2024-01-27 ENCOUNTER — Encounter (HOSPITAL_COMMUNITY): Payer: Self-pay

## 2024-01-27 ENCOUNTER — Emergency Department (HOSPITAL_COMMUNITY)
Admission: EM | Admit: 2024-01-27 | Discharge: 2024-01-27 | Disposition: A | Discharge status: Home / Self Care | Source: Home / Self Care | Attending: Emergency Medicine | Admitting: Emergency Medicine

## 2024-01-27 ENCOUNTER — Emergency Department (HOSPITAL_COMMUNITY)
Admission: EM | Admit: 2024-01-27 | Discharge: 2024-01-27 | Discharge status: Against Medical Advice | Attending: Emergency Medicine | Admitting: Emergency Medicine

## 2024-01-27 ENCOUNTER — Other Ambulatory Visit: Payer: Self-pay

## 2024-01-27 DIAGNOSIS — J45909 Unspecified asthma, uncomplicated: Secondary | ICD-10-CM | POA: Diagnosis not present

## 2024-01-27 DIAGNOSIS — K2900 Acute gastritis without bleeding: Secondary | ICD-10-CM | POA: Diagnosis not present

## 2024-01-27 DIAGNOSIS — R109 Unspecified abdominal pain: Secondary | ICD-10-CM | POA: Diagnosis present

## 2024-01-27 DIAGNOSIS — K920 Hematemesis: Secondary | ICD-10-CM | POA: Insufficient documentation

## 2024-01-27 DIAGNOSIS — Z5321 Procedure and treatment not carried out due to patient leaving prior to being seen by health care provider: Secondary | ICD-10-CM | POA: Diagnosis not present

## 2024-01-27 LAB — COMPREHENSIVE METABOLIC PANEL WITH GFR
ALT: 17 U/L (ref 0–44)
ALT: 14 U/L (ref 0–44)
AST: 18 U/L (ref 15–41)
AST: 27 U/L (ref 15–41)
Albumin: 4 g/dL (ref 3.5–5.0)
Albumin: 3.6 g/dL (ref 3.5–5.0)
Alkaline Phosphatase: 65 U/L (ref 38–126)
Alkaline Phosphatase: 61 U/L (ref 38–126)
Anion gap: 11 (ref 5–15)
Anion gap: 11 (ref 5–15)
BUN: 6 mg/dL (ref 6–20)
BUN: 7 mg/dL (ref 6–20)
CO2: 24 mmol/L (ref 22–32)
CO2: 21 mmol/L — ABNORMAL LOW (ref 22–32)
Calcium: 9.2 mg/dL (ref 8.9–10.3)
Calcium: 8.9 mg/dL (ref 8.9–10.3)
Chloride: 102 mmol/L (ref 98–111)
Chloride: 103 mmol/L (ref 98–111)
Creatinine, Ser: 0.82 mg/dL (ref 0.44–1.00)
Creatinine, Ser: 0.7 mg/dL (ref 0.44–1.00)
GFR, Estimated: >60 mL/min (ref >60)
GFR, Estimated: >60 mL/min (ref >60)
Glucose, Bld: 116 mg/dL — ABNORMAL HIGH (ref 70–99)
Glucose, Bld: 106 mg/dL — ABNORMAL HIGH (ref 70–99)
Potassium: 3.8 mmol/L (ref 3.5–5.1)
Potassium: 4.7 mmol/L (ref 3.5–5.1)
Sodium: 137 mmol/L (ref 135–145)
Sodium: 135 mmol/L (ref 135–145)
Total Bilirubin: 0.5 mg/dL (ref 0.0–1.2)
Total Bilirubin: 0.7 mg/dL (ref 0.0–1.2)
Total Protein: 8.3 g/dL — ABNORMAL HIGH (ref 6.5–8.1)
Total Protein: 7.7 g/dL (ref 6.5–8.1)

## 2024-01-27 LAB — CBC WITH DIFFERENTIAL/PLATELET
Abs Immature Granulocytes: 0.02 K/uL (ref 0.00–0.07)
Basophils Absolute: 0.1 K/uL (ref 0.0–0.1)
Basophils Relative: 1 %
Eosinophils Absolute: 0.2 K/uL (ref 0.0–0.5)
Eosinophils Relative: 3 %
HCT: 42.8 % (ref 36.0–46.0)
Hemoglobin: 13.8 g/dL (ref 12.0–15.0)
Immature Granulocytes: 0 %
Lymphocytes Relative: 29 %
Lymphs Abs: 2.1 K/uL (ref 0.7–4.0)
MCH: 31.1 pg (ref 26.0–34.0)
MCHC: 32.2 g/dL (ref 30.0–36.0)
MCV: 96.4 fL (ref 80.0–100.0)
Monocytes Absolute: 0.6 K/uL (ref 0.1–1.0)
Monocytes Relative: 8 %
Neutro Abs: 4.5 K/uL (ref 1.7–7.7)
Neutrophils Relative %: 59 %
Platelets: 309 K/uL (ref 150–400)
RBC: 4.44 MIL/uL (ref 3.87–5.11)
RDW: 13.2 % (ref 11.5–15.5)
WBC: 7.5 K/uL (ref 4.0–10.5)
nRBC: 0 % (ref 0.0–0.2)

## 2024-01-27 LAB — CBC
HCT: 44.5 % (ref 36.0–46.0)
Hemoglobin: 14.6 g/dL (ref 12.0–15.0)
MCH: 31.2 pg (ref 26.0–34.0)
MCHC: 32.8 g/dL (ref 30.0–36.0)
MCV: 95.1 fL (ref 80.0–100.0)
Platelets: 316 K/uL (ref 150–400)
RBC: 4.68 MIL/uL (ref 3.87–5.11)
RDW: 13.2 % (ref 11.5–15.5)
WBC: 9.4 K/uL (ref 4.0–10.5)
nRBC: 0 % (ref 0.0–0.2)

## 2024-01-27 LAB — LACTIC ACID, PLASMA: Lactic Acid, Venous: 1.7 mmol/L (ref 0.5–1.9)

## 2024-01-27 LAB — HCG, SERUM, QUALITATIVE: Preg, Serum: NEGATIVE

## 2024-01-27 LAB — LIPASE, BLOOD: Lipase: 59 U/L — ABNORMAL HIGH (ref 11–51)

## 2024-01-27 MED ORDER — LIDOCAINE VISCOUS HCL 2 % MT SOLN
15.0000 mL | Freq: Once | OROMUCOSAL | Status: AC
  Administered 2024-01-27: 15 mL via ORAL
  Filled 2024-01-27: qty 15

## 2024-01-27 MED ORDER — ALUM & MAG HYDROXIDE-SIMETH 200-200-20 MG/5ML PO SUSP
30.0000 mL | Freq: Once | ORAL | Status: AC
  Administered 2024-01-27: 30 mL via ORAL
  Filled 2024-01-27: qty 30

## 2024-01-27 MED ORDER — ONDANSETRON 4 MG PO TBDP: 4.0000 mg | ORAL_TABLET | Freq: Three times a day (TID) | ORAL | 0 refills | Status: AC | PRN

## 2024-01-27 MED ORDER — SODIUM CHLORIDE 0.9 % IV BOLUS
1000.0000 mL | Freq: Once | INTRAVENOUS | Status: AC
  Administered 2024-01-27: 1000 mL via INTRAVENOUS

## 2024-01-27 MED ORDER — OMEPRAZOLE 20 MG PO CPDR: 20.0000 mg | DELAYED_RELEASE_CAPSULE | Freq: Every day | ORAL | 0 refills | Status: AC

## 2024-01-27 MED ORDER — SUCRALFATE 1 G PO TABS: 1.0000 g | ORAL_TABLET | Freq: Three times a day (TID) | ORAL | 0 refills | Status: AC

## 2024-01-27 NOTE — Discharge Instructions (Signed)
 Your history, exam, and evaluation today seem consistent with stomach and esophageal inflammation called gastritis that may have been due to the acidic foods versus alcohol as we discussed.  Your x-ray was reassuring and the labs also did not show significant concerning findings.  We feel you are safe for discharge home but please follow-up with your PCP and a GI doctor.  Please take the stomach acid medicine Prilosec and the Carafate  to help with the stomach acid problems.  You may also use the nausea medicine to help and maintain your hydration.  If any symptoms change or worsen acutely, would return to the nearest emergency department.

## 2024-01-27 NOTE — ED Notes (Signed)
 Called for pt 3x, pt did not answer. Will try again

## 2024-01-27 NOTE — ED Triage Notes (Signed)
 Pt c.o acid reflux pain and 2 episode of bloody vomiting last night. Hx of GERD. Pt drinks beer daily, states sometimes a little and sometimes a lot.

## 2024-01-27 NOTE — ED Triage Notes (Addendum)
 Pt is coming in with complaints of  vomiting blood , she mentions that is dark in appearance and thinks there eis clots in it, all of this occurred around 2hrs ago and gas been a total of 3 episodes of vomiting. She does appear to be in some discomfort that she attributes to he esophagus burning but does have a Hx of GERD and acid reflux. She has taken no medications prior to arrival.  No complaints of chest pain or shortness of breath.

## 2024-01-27 NOTE — ED Notes (Signed)
 Patient comfortable and resting, warm blanket and pillow given

## 2024-01-27 NOTE — ED Provider Notes (Signed)
 [place] EMERGENCY DEPARTMENT AT [facility] Provider Note   CSN: [id number] Arrival date & time: 01/27/24  1214     Patient presents with: Hematemesis   Monica Holloway is a 38 y.o. female.   The history is provided by the patient and medical records. No language interpreter was used.  Emesis Severity:  Moderate Duration:  1 day Timing:  Intermittent Quality:  Stomach contents (and some dark red blood) Progression:  Unchanged Chronicity:  Recurrent Recent urination:  Normal Relieved by:  Nothing Worsened by:  Nothing Ineffective treatments:  None tried Associated symptoms: abdominal pain (epigastric and burning)   Associated symptoms: no chills, no cough, no diarrhea, no fever and no headaches   Risk factors: alcohol use        Prior to Admission medications   Medication Sig Start Date End Date Taking? Authorizing Provider  elvitegravir-cobicistat-emtricitabine-tenofovir (GENVOYA ) 150-150-200-10 MG TABS tablet Take 1 tablet by mouth daily with breakfast. Patient not taking: Reported on 12/16/2017 12/15/17   McDonald, Mia A, PA-C  elvitegravir-cobicistat-emtricitabine-tenofovir (GENVOYA ) 150-150-200-10 MG TABS tablet Take 1 tablet by mouth daily with breakfast. Patient not taking: Reported on 12/16/2017 12/15/17   McDonald, Mia A, PA-C  ibuprofen  (ADVIL ,MOTRIN ) 600 MG tablet Take 1 tablet (600 mg total) by mouth every 6 (six) hours. Patient not taking: Reported on 12/16/2017 05/26/17   Ascencion Gauze, DO    Allergies: Patient has no known allergies.    Review of Systems  Constitutional:  Negative for chills, diaphoresis, fatigue and fever.  HENT:  Negative for congestion.   Eyes:  Negative for visual disturbance.  Respiratory:  Negative for cough, chest tightness, shortness of breath and wheezing.   Cardiovascular:  Positive for chest pain (burning when laying flat).  Gastrointestinal:  Positive for abdominal pain (epigastric and burning), nausea and vomiting.  Negative for blood in stool, constipation and diarrhea.  Genitourinary:  Negative for dysuria.  Musculoskeletal:  Negative for back pain, neck pain and neck stiffness.  Skin:  Negative for rash and wound.  Neurological:  Negative for weakness, light-headedness, numbness and headaches.  Psychiatric/Behavioral:  Negative for agitation and confusion.   All other systems reviewed and are negative.   Updated Vital Signs BP (!) 120/96   Pulse 79   Temp 99.4 F (37.4 C) (Oral)   Resp 14   SpO2 100%   Physical Exam Vitals and nursing note reviewed.  Constitutional:      General: She is not in acute distress.    Appearance: She is well-developed. She is not ill-appearing, toxic-appearing or diaphoretic.  HENT:     Head: Normocephalic and atraumatic.     Right Ear: External ear normal.     Left Ear: External ear normal.     Nose: Nose normal. No congestion or rhinorrhea.     Mouth/Throat:     Mouth: Mucous membranes are moist.     Pharynx: No oropharyngeal exudate or posterior oropharyngeal erythema.  Eyes:     Extraocular Movements: Extraocular movements intact.     Conjunctiva/sclera: Conjunctivae normal.     Pupils: Pupils are equal, round, and reactive to light.  Cardiovascular:     Rate and Rhythm: Normal rate.     Pulses: Normal pulses.     Heart sounds: No murmur heard. Pulmonary:     Effort: No respiratory distress.     Breath sounds: No stridor. No wheezing, rhonchi or rales.  Chest:     Chest wall: No tenderness.  Abdominal:  General: Abdomen is flat. There is no distension.     Tenderness: There is no abdominal tenderness. There is no right CVA tenderness, left CVA tenderness, guarding or rebound.  Musculoskeletal:     Cervical back: Normal range of motion and neck supple. No tenderness.  Skin:    General: Skin is warm.     Findings: No erythema or rash.  Neurological:     General: No focal deficit present.     Mental Status: She is alert and oriented to  person, place, and time.     Sensory: No sensory deficit.     Motor: No weakness or abnormal muscle tone.     Deep Tendon Reflexes: Reflexes are normal and symmetric.  Psychiatric:        Mood and Affect: Mood normal.     (all labs ordered are listed, but only abnormal results are displayed) Labs Reviewed  COMPREHENSIVE METABOLIC PANEL WITH GFR - Abnormal; Notable for the following components:      Result Value   CO2 21 (*)    Glucose, Bld 106 (*)    All other components within normal limits  LIPASE, BLOOD - Abnormal; Notable for the following components:   Lipase 59 (*)    All other components within normal limits  CBC WITH DIFFERENTIAL/PLATELET  LACTIC ACID, PLASMA  URINALYSIS, W/ REFLEX TO CULTURE (INFECTION SUSPECTED)    EKG: EKG Interpretation Date/Time:  Saturday January 27 2024 12:21:36 EDT Ventricular Rate:  107 PR Interval:  120 QRS Duration:  82 QT Interval:  334 QTC Calculation: 445 R Axis:   50  Text Interpretation: Sinus tachycardia Nonspecific T wave abnormality Abnormal ECG When compared with ECG of 27-Jan-2024 04:10, PREVIOUS ECG IS PRESENT when compared to prior, similar appearance No STEMI Confirmed by Ginger Barefoot ([id number]) on 01/27/2024 12:31:48 PM  Radiology: DG Chest 2 View Result Date: 01/27/2024 CLINICAL DATA:  n/v/chest pain. some hematemesis. reflux EXAM: CHEST - 2 VIEW COMPARISON:  Chest x-ray 02/21/2017, CT chest 01/26/2017 FINDINGS: The heart and mediastinal contours are within normal limits. No focal consolidation. No pulmonary edema. No pleural effusion. No pneumothorax. No acute osseous abnormality. IMPRESSION: No active cardiopulmonary disease. Electronically Signed   By: Morgane  Naveau M.D.   On: 01/27/2024 14:19     Procedures   Medications Ordered in the ED  alum & mag hydroxide-simeth (MAALOX/MYLANTA) 200-200-20 MG/5ML suspension 30 mL (30 mLs Oral Given 01/27/24 1326)    And  lidocaine  (XYLOCAINE ) 2 % viscous mouth solution 15 mL (15 mLs  Oral Given 01/27/24 1326)  sodium chloride  0.9 % bolus 1,000 mL (0 mLs Intravenous Stopped 01/27/24 1518)                                    Medical Decision Making Amount and/or Complexity of Data Reviewed Labs: ordered. Radiology: ordered.  Risk OTC drugs. Prescription drug management.    Monica Holloway is a 38 y.o. female with a past medical history significant for alcohol use, asthma, and panic attacks who presents with nausea, vomiting, possible hematemesis, and some burning in the lower chest and epigastric area.  Patient reports she does drink alcohol and had some food last night with tomatoes that sometimes upsets her stomach.  She does states she has a history of some heartburn and reflux and reports that is bothering her worse when she lays back and lays flat this morning.  She had several  episodes of vomiting and presents for evaluation.  She denies any bowel changes and denies any rectal bleeding or constipation or diarrhea.  Denies any trauma.  Denies any chest pain otherwise and denies any shortness of breath.  Denies palpitations.  She has not taken any medicine to help her symptoms.  She said that she was on medicine when she was in incarcerated recently but has not formally been seen for GI's problems.  On exam, lungs were clear.  Chest had very minimal tenderness.  Abdomen was nontender overall.  Good bowel sounds.  Legs nontender nonedematous.  Good pulses.  Patient otherwise well-appearing and resting.  Patient does not appear clinically dehydrated.  EKG does not show STEMI.  Given her lack of tenderness and otherwise well appearance I do suspect this is more of a gastritis or esophagitis given her description of it being worse when she lays back in flat with burning.  We agreed to give her GI cocktail and get screening labs and a chest x-ray.  We agreed to hold on advanced imaging of her abdomen or pelvis given her lack of focal tenderness and patient agrees  initially.  Patient's labs showed no significant anemia that was checked with early's morning and now.  Metabolic panel reassuring.  Lipase slightly elevated at 59 but not critically so.  She was found not to be pregnant early this morning so will not recheck it.  Lactic acid was normal.  Chest x-ray did not show pneumonia.  We had a shared decision-making conversation offering to wait for urinalysis but given her lack of urinary symptoms agreed to hold on urine at this time.  She reports complete resolution of her symptoms after GI cocktail and she thinks it is heartburn and reflux.  We also offered to do a fecal occult test but as I do suspect this likely was a bleeding without significant EMEA, do not feel would likely change her management at this time.  We will have patient follow-up with GI and patient agrees.  She will call these with our [facility] for close follow-up and will start her on Prilosec and Carafate .  Given her resolution of symptoms she would like to go home.  Will discharge for outpatient follow-up and will hold on further workup at this time.  Patient with questions or concerns to condition.       Final diagnoses:  Acute gastritis, presence of bleeding unspecified, unspecified gastritis type  Hematemesis, unspecified whether nausea present    ED Discharge Orders          Ordered    omeprazole  (PRILOSEC) 20 MG capsule  Daily        01/27/24 1504    sucralfate  (CARAFATE ) 1 g tablet  3 times daily with meals & bedtime        01/27/24 1504    ondansetron  (ZOFRAN -ODT) 4 MG disintegrating tablet  Every 8 hours PRN        01/27/24 1504            Clinical Impression: 1. Acute gastritis, presence of bleeding unspecified, unspecified gastritis type   2. Hematemesis, unspecified whether nausea present     Disposition: Discharge  Condition: Good  I have discussed the results, Dx and Tx plan with the pt(& family if present). He/she/they expressed understanding and  agree(s) with the plan. Discharge instructions discussed at great length. Strict return precautions discussed and pt &/or family have verbalized understanding of the instructions. No further questions at time of discharge.  New Prescriptions   OMEPRAZOLE  (PRILOSEC) 20 MG CAPSULE    Take 1 capsule (20 mg total) by mouth daily.   ONDANSETRON  (ZOFRAN -ODT) 4 MG DISINTEGRATING TABLET    Take 1 tablet (4 mg total) by mouth every 8 (eight) hours as needed for nausea or vomiting.   SUCRALFATE  (CARAFATE ) 1 G TABLET    Take 1 tablet (1 g total) by mouth 4 (four) times daily -  with meals and at bedtime.    Follow Up: [facility] [address] [place] [address] [place]  [id number] [phone] Schedule an appointment as soon as possible for a visit    [facility] [address] [place]   [id number] [phone]    Gastroenterology, [place] [address] [place] [address] [phone]         Jacelynn Hayton, Lonni PARAS, MD 01/27/24 1530

## 2024-01-31 LAB — BPAM RBC
Blood Product Expiration Date: 202508092359
Blood Product Expiration Date: 202508232359
Unit Type and Rh: 7300
Unit Type and Rh: 7300

## 2024-01-31 LAB — TYPE AND SCREEN
ABO/RH(D): B POS
Antibody Screen: POSITIVE
Unit division: 0
Unit division: 0

## 2024-03-02 ENCOUNTER — Other Ambulatory Visit: Payer: Self-pay

## 2024-03-02 ENCOUNTER — Emergency Department (HOSPITAL_COMMUNITY)

## 2024-03-02 ENCOUNTER — Encounter (HOSPITAL_COMMUNITY): Payer: Self-pay | Admitting: *Deleted

## 2024-03-02 ENCOUNTER — Emergency Department (HOSPITAL_COMMUNITY)
Admission: EM | Admit: 2024-03-02 | Discharge: 2024-03-02 | Discharge status: Against Medical Advice | Attending: Emergency Medicine | Admitting: Emergency Medicine

## 2024-03-02 DIAGNOSIS — M7989 Other specified soft tissue disorders: Secondary | ICD-10-CM | POA: Insufficient documentation

## 2024-03-02 DIAGNOSIS — M25572 Pain in left ankle and joints of left foot: Secondary | ICD-10-CM | POA: Diagnosis present

## 2024-03-02 DIAGNOSIS — Z5321 Procedure and treatment not carried out due to patient leaving prior to being seen by health care provider: Secondary | ICD-10-CM | POA: Diagnosis not present

## 2024-03-02 MED ORDER — OXYCODONE-ACETAMINOPHEN 5-325 MG PO TABS
ORAL_TABLET | ORAL | Status: AC
  Filled 2024-03-02: qty 1

## 2024-03-02 MED ORDER — OXYCODONE-ACETAMINOPHEN 5-325 MG PO TABS
1.0000 | ORAL_TABLET | Freq: Once | ORAL | Status: AC
  Administered 2024-03-02: 1 via ORAL

## 2024-03-02 NOTE — ED Triage Notes (Signed)
 Pt in ambulatory with pain and swelling to L ankle, states it began a few hours ago. Hx of L ankle fx with hardware

## 2024-03-02 NOTE — ED Notes (Signed)
 Pt taken OTF as they were not here when called for a room.

## 2024-07-18 ENCOUNTER — Emergency Department (HOSPITAL_COMMUNITY)
Admission: EM | Admit: 2024-07-18 | Discharge: 2024-07-18 | Discharge status: Against Medical Advice | Attending: Emergency Medicine | Admitting: Emergency Medicine

## 2024-07-18 ENCOUNTER — Other Ambulatory Visit: Payer: Self-pay

## 2024-07-18 ENCOUNTER — Emergency Department (HOSPITAL_COMMUNITY)

## 2024-07-18 DIAGNOSIS — Z5321 Procedure and treatment not carried out due to patient leaving prior to being seen by health care provider: Secondary | ICD-10-CM | POA: Diagnosis not present

## 2024-07-18 DIAGNOSIS — R1110 Vomiting, unspecified: Secondary | ICD-10-CM | POA: Insufficient documentation

## 2024-07-18 DIAGNOSIS — R0602 Shortness of breath: Secondary | ICD-10-CM | POA: Insufficient documentation

## 2024-07-18 DIAGNOSIS — R079 Chest pain, unspecified: Secondary | ICD-10-CM | POA: Insufficient documentation

## 2024-07-18 LAB — BASIC METABOLIC PANEL WITH GFR
Anion gap: 14 (ref 5–15)
BUN: 11 mg/dL (ref 6–20)
CO2: 22 mmol/L (ref 22–32)
Calcium: 8.9 mg/dL (ref 8.9–10.3)
Chloride: 104 mmol/L (ref 98–111)
Creatinine, Ser: 0.96 mg/dL (ref 0.44–1.00)
GFR, Estimated: >60 mL/min
Glucose, Bld: 103 mg/dL — ABNORMAL HIGH (ref 70–99)
Potassium: 4 mmol/L (ref 3.5–5.1)
Sodium: 140 mmol/L (ref 135–145)

## 2024-07-18 LAB — CBC
HCT: 41.3 % (ref 36.0–46.0)
Hemoglobin: 14 g/dL (ref 12.0–15.0)
MCH: 31.8 pg (ref 26.0–34.0)
MCHC: 33.9 g/dL (ref 30.0–36.0)
MCV: 93.9 fL (ref 80.0–100.0)
Platelets: 324 K/uL (ref 150–400)
RBC: 4.4 MIL/uL (ref 3.87–5.11)
RDW: 13.2 % (ref 11.5–15.5)
WBC: 8.1 K/uL (ref 4.0–10.5)
nRBC: 0 % (ref 0.0–0.2)

## 2024-07-18 LAB — HCG, SERUM, QUALITATIVE: Preg, Serum: NEGATIVE

## 2024-07-18 LAB — TROPONIN T, HIGH SENSITIVITY: Troponin T High Sensitivity: <6 ng/L (ref 0–19)

## 2024-07-18 NOTE — ED Triage Notes (Signed)
 Patient reports central chest pain with SOB and emesis this evening .

## 2024-07-18 NOTE — ED Notes (Signed)
 Patient left.
